# Patient Record
Sex: Male | Born: 1950 | Race: Black or African American | Hispanic: No | Marital: Married | Smoking: Never smoker
Health system: Southern US, Community
[De-identification: ages and names within clinical notes are randomized; demographics above are authoritative.]

## PROBLEM LIST (undated history)

## (undated) DIAGNOSIS — E349 Endocrine disorder, unspecified: Secondary | ICD-10-CM

## (undated) DIAGNOSIS — G4730 Sleep apnea, unspecified: Secondary | ICD-10-CM

## (undated) DIAGNOSIS — H409 Unspecified glaucoma: Secondary | ICD-10-CM

## (undated) DIAGNOSIS — I219 Acute myocardial infarction, unspecified: Secondary | ICD-10-CM

## (undated) DIAGNOSIS — Z86711 Personal history of pulmonary embolism: Secondary | ICD-10-CM

## (undated) DIAGNOSIS — I252 Old myocardial infarction: Secondary | ICD-10-CM

## (undated) HISTORY — DX: Unspecified glaucoma: H40.9

## (undated) HISTORY — DX: Sleep apnea, unspecified: G47.30

## (undated) HISTORY — DX: Endocrine disorder, unspecified: E34.9

## (undated) HISTORY — DX: Acute myocardial infarction, unspecified: I21.9

## (undated) HISTORY — DX: Old myocardial infarction: I25.2

## (undated) HISTORY — DX: Personal history of pulmonary embolism: Z86.711

## (undated) HISTORY — PX: SHOULDER SURGERY: SHX246

---

## 2002-10-23 ENCOUNTER — Emergency Department (HOSPITAL_COMMUNITY): Admission: EM | Admit: 2002-10-23 | Discharge: 2002-10-23 | Payer: Self-pay | Admitting: Emergency Medicine

## 2002-10-23 ENCOUNTER — Encounter: Payer: Self-pay | Admitting: Emergency Medicine

## 2004-10-28 ENCOUNTER — Emergency Department (HOSPITAL_COMMUNITY): Admission: EM | Admit: 2004-10-28 | Discharge: 2004-10-28 | Payer: Self-pay | Admitting: Emergency Medicine

## 2009-07-24 ENCOUNTER — Encounter: Admission: RE | Admit: 2009-07-24 | Discharge: 2009-07-24 | Payer: Self-pay | Admitting: *Deleted

## 2012-11-02 ENCOUNTER — Emergency Department (INDEPENDENT_AMBULATORY_CARE_PROVIDER_SITE_OTHER)
Admission: EM | Admit: 2012-11-02 | Discharge: 2012-11-02 | Disposition: A | Discharge status: Home / Self Care | Payer: BC Managed Care – PPO | Source: Home / Self Care | Attending: Family Medicine | Admitting: Family Medicine

## 2012-11-02 ENCOUNTER — Encounter (HOSPITAL_COMMUNITY): Payer: Self-pay | Admitting: Emergency Medicine

## 2012-11-02 DIAGNOSIS — S0003XA Contusion of scalp, initial encounter: Secondary | ICD-10-CM

## 2012-11-02 DIAGNOSIS — S00432A Contusion of left ear, initial encounter: Secondary | ICD-10-CM

## 2012-11-02 MED ORDER — DOXYCYCLINE HYCLATE 100 MG PO CAPS: 100.0000 mg | ORAL_CAPSULE | Freq: Two times a day (BID) | ORAL | Status: DC

## 2012-11-02 NOTE — ED Notes (Signed)
Pt c/o abscess on left ear/lobe since Saturday. Sx include: pain Denies: fevers, vomiting, nauseas, diarrhea  He is alert w/no signs of acute distress.

## 2012-11-02 NOTE — ED Provider Notes (Signed)
History     CSN: [id number]  Arrival date & time 11/02/12  1356   First MD Initiated Contact with Patient 11/02/12 1506      Chief Complaint  Patient presents with  . Abscess    (Consider location/radiation/quality/duration/timing/severity/associated sxs/prior treatment) Patient is a 62 y.o. male presenting with abscess. The history is provided by the patient.  Abscess  This is a new problem. The current episode started less than one week ago. The onset was sudden. The problem has been gradually worsening. The abscess is present on the left ear. The problem is mild. The abscess is characterized by painfulness and swelling. The abscess first occurred at home. Pertinent negatives include no congestion.    History reviewed. No pertinent past medical history.  Past Surgical History  Procedure Date  . Shoulder surgery     Family History  Problem Relation Age of Onset  . Family history unknown: Yes    History  Substance Use Topics  . Smoking status: Never Smoker   . Smokeless tobacco: Not on file  . Alcohol Use: No      Review of Systems  Constitutional: Negative.   HENT: Positive for ear pain. Negative for hearing loss, congestion and ear discharge.     Allergies  Review of patient's allergies indicates no known allergies.  Home Medications   Current Outpatient Rx  Name  Route  Sig  Dispense  Refill  . DOXYCYCLINE HYCLATE 100 MG PO CAPS   Oral   Take 1 capsule (100 mg total) by mouth 2 (two) times daily.   14 capsule   0   . HYDROCODONE-ACETAMINOPHEN 5-500 MG PO TABS   Oral   Take 1 tablet by mouth every 8 (eight) hours as needed for pain.   15 tablet   0   . IBUPROFEN 600 MG PO TABS   Oral   Take 1 tablet (600 mg total) by mouth every 8 (eight) hours as needed for pain.   21 tablet   0   . PREDNISONE 20 MG PO TABS      2 tabs po daily for 5 days   10 tablet   0   . VALACYCLOVIR HCL 1 G PO TABS   Oral   Take 1 tablet (1,000 mg total) by mouth  3 (three) times daily.   21 tablet   0     BP 124/82  Pulse 72  Temp 97.4 F (36.3 C) (Oral)  Resp 22  SpO2 98%  Physical Exam  Nursing note and vitals reviewed. Constitutional: He is oriented to person, place, and time. He appears well-developed and well-nourished.  HENT:  Head: Normocephalic.    Right Ear: External ear normal.  Nose: Nose normal.  Mouth/Throat: Oropharynx is clear and moist.  Eyes: Pupils are equal, round, and reactive to light.  Neck: Normal range of motion. Neck supple.  Lymphadenopathy:    He has no cervical adenopathy.  Neurological: He is alert and oriented to person, place, and time.  Skin: Skin is warm and dry.    ED Course  INCISION AND DRAINAGE Date/Time: 11/02/2012 4:33 PM Performed by: Linna Hoff Authorized by: Leslee Home C Consent: Verbal consent obtained. Risks and benefits: risks, benefits and alternatives were discussed Consent given by: patient Type: cyst Body area: head/neck Location details: left external ear Anesthesia: local infiltration Local anesthetic: lidocaine 2% without epinephrine Patient sedated: no Scalpel size: 11 Incision type: single straight Complexity: simple Drainage: bloody Drainage amount: moderate Patient tolerance:  Patient tolerated the procedure well with no immediate complications. Comments: dermabond wound closure.   (including critical care time)  Labs Reviewed - No data to display No results found.   1. Hematoma of left ear       MDM          Linna Hoff, MD 11/06/12 [phone]

## 2012-11-02 NOTE — ED Notes (Signed)
VS corrected s/p d/c (at 1708 hours).

## 2012-11-04 ENCOUNTER — Emergency Department (INDEPENDENT_AMBULATORY_CARE_PROVIDER_SITE_OTHER)
Admission: EM | Admit: 2012-11-04 | Discharge: 2012-11-04 | Disposition: A | Discharge status: Home / Self Care | Payer: BC Managed Care – PPO | Source: Home / Self Care | Attending: Family Medicine | Admitting: Family Medicine

## 2012-11-04 ENCOUNTER — Encounter (HOSPITAL_COMMUNITY): Payer: Self-pay | Admitting: *Deleted

## 2012-11-04 DIAGNOSIS — G510 Bell's palsy: Secondary | ICD-10-CM

## 2012-11-04 MED ORDER — PREDNISONE 20 MG PO TABS
ORAL_TABLET | ORAL | Status: DC
Start: 2012-11-04 — End: 2014-03-21

## 2012-11-04 MED ORDER — VALACYCLOVIR HCL 1 G PO TABS: 1000.0000 mg | ORAL_TABLET | Freq: Three times a day (TID) | ORAL | Status: AC

## 2012-11-04 MED ORDER — IBUPROFEN 600 MG PO TABS: 600.0000 mg | ORAL_TABLET | Freq: Three times a day (TID) | ORAL | Status: DC | PRN

## 2012-11-04 MED ORDER — HYDROCODONE-ACETAMINOPHEN 5-500 MG PO TABS: 1.0000 | ORAL_TABLET | Freq: Three times a day (TID) | ORAL | Status: DC | PRN

## 2012-11-04 NOTE — ED Notes (Signed)
Pt reports that he was seen on Monday with ear abscess - states that he started to have left side facial numbness, unable to speak normally or spit - has not improved since monday

## 2012-11-10 NOTE — ED Provider Notes (Signed)
History     CSN: [id number]  Arrival date & time 11/04/12  1348   First MD Initiated Contact with Patient 11/04/12 1354      Chief Complaint  Patient presents with  . Jaw Pain    (Consider location/radiation/quality/duration/timing/severity/associated sxs/prior treatment) HPI Comments: 62 y/o male no significant past medical history other than recent left ear infection. Was here 3 days ago had a swollen tender area in left external ear that was drained. Today patient states swelling and pain in eternal ear is improved, he is taking doxycycline but noticed difficulty closing his left eye and left face droop and altered taste.no visual changes. No balance or gate problems. No extremity weakness, numbness or paresthesias.    History reviewed. No pertinent past medical history.  Past Surgical History  Procedure Date  . Shoulder surgery     Family History  Problem Relation Age of Onset  . Family history unknown: Yes    History  Substance Use Topics  . Smoking status: Never Smoker   . Smokeless tobacco: Not on file  . Alcohol Use: No      Review of Systems  Constitutional: Negative for fever and chills.  HENT: Positive for ear pain. Negative for hearing loss, congestion, sore throat, drooling, trouble swallowing, neck stiffness, sinus pressure and ear discharge.   Eyes: Negative for pain, redness and visual disturbance.  Respiratory: Negative for cough and shortness of breath.   Cardiovascular: Negative for chest pain.  Gastrointestinal: Negative for nausea, vomiting, abdominal pain and diarrhea.  Musculoskeletal: Negative for arthralgias.  Skin: Positive for rash.       External ear swelling, redness.  Neurological: Negative for dizziness, seizures and headaches.  All other systems reviewed and are negative.    Allergies  Review of patient's allergies indicates no known allergies.  Home Medications   Current Outpatient Rx  Name  Route  Sig  Dispense  Refill  .  DOXYCYCLINE HYCLATE 100 MG PO CAPS   Oral   Take 1 capsule (100 mg total) by mouth 2 (two) times daily.   14 capsule   0   . HYDROCODONE-ACETAMINOPHEN 5-500 MG PO TABS   Oral   Take 1 tablet by mouth every 8 (eight) hours as needed for pain.   15 tablet   0   . IBUPROFEN 600 MG PO TABS   Oral   Take 1 tablet (600 mg total) by mouth every 8 (eight) hours as needed for pain.   21 tablet   0   . PREDNISONE 20 MG PO TABS      2 tabs po daily for 5 days   10 tablet   0   . VALACYCLOVIR HCL 1 G PO TABS   Oral   Take 1 tablet (1,000 mg total) by mouth 3 (three) times daily.   21 tablet   0     BP 128/74  Pulse 74  Temp 98.1 F (36.7 C) (Oral)  Resp 20  SpO2 100%  Physical Exam  Nursing note and vitals reviewed. Constitutional: He is oriented to person, place, and time. He appears well-developed and well-nourished. No distress.  HENT:  Head: Normocephalic and atraumatic.  Right Ear: External ear normal.  Nose: Nose normal.  Mouth/Throat: Oropharynx is clear and moist. No oropharyngeal exudate.       Left ear: there is mild erythema and non tender edema over antitragus area. There is a small surgical incision less than 1 cm on top. Minimal scabbing. No purulent  drainage. Impress dry blood inside the ear canal and also staining the left TM.   Neurological: He is alert and oriented to person, place, and time. He has normal reflexes. He displays a negative Romberg sign. Coordination and gait normal. GCS eye subscore is 4. GCS verbal subscore is 5. GCS motor subscore is 6.       Left side subtle facial palsy. With able to close left eye slowly but completely. Mild deviation of mouth towards right side. Asymmetric smile. Palsy involves forehead. No obvious face rash. No hypo or hyperesthesia. Tip of tongue with altered taste.  Normal alternating movements. No arm drop.  Visual fields normal by comparison.  Other than facial palsy no other neurologic focalizations.   Skin: He  is not diaphoretic.    ED Course  Procedures (including critical care time)  Labs Reviewed - No data to display No results found.   1. Bell's palsy       MDM  External ear cellulitis improving (dr. Melrose Nakayama also examined patient and corroborated improved exam on ear infection. Treated Bells palsy with prednisone, valacyclovir, ibuprofen and vicodin. Supportive care and red flags that should prompt his return to medical attention discussed with patient and provided in writing. ENT referral as needed.         Sharin Grave, MD 11/10/12 1610

## 2013-07-20 ENCOUNTER — Observation Stay: Payer: Self-pay | Admitting: Specialist

## 2013-07-20 LAB — CBC
HCT: 44.2 % (ref 40.0–52.0)
HGB: 15.1 g/dL (ref 13.0–18.0)
MCH: 32.6 pg (ref 26.0–34.0)
MCV: 95 fL (ref 80–100)
RDW: 13.3 % (ref 11.5–14.5)
WBC: 9.2 10*3/uL (ref 3.8–10.6)

## 2013-07-20 LAB — COMPREHENSIVE METABOLIC PANEL
Alkaline Phosphatase: 91 U/L (ref 50–136)
Anion Gap: 5 — ABNORMAL LOW (ref 7–16)
Bilirubin,Total: 0.9 mg/dL (ref 0.2–1.0)
Calcium, Total: 9.3 mg/dL (ref 8.5–10.1)
EGFR (African American): >60
Glucose: 100 mg/dL — ABNORMAL HIGH (ref 65–99)
Osmolality: 275 (ref 275–301)
Sodium: 137 mmol/L (ref 136–145)

## 2013-07-20 LAB — APTT: Activated PTT: 37.3 secs — ABNORMAL HIGH (ref 23.6–35.9)

## 2013-07-20 LAB — LIPID PANEL: Ldl Cholesterol, Calc: 80 mg/dL (ref 0–100)

## 2013-07-21 LAB — TROPONIN I: Troponin-I: <0.02 ng/mL

## 2013-07-21 LAB — CK TOTAL AND CKMB (NOT AT ARMC): CK-MB: 1.1 ng/mL (ref 0.5–3.6)

## 2013-09-14 ENCOUNTER — Other Ambulatory Visit: Payer: BC Managed Care – PPO

## 2013-11-11 ENCOUNTER — Other Ambulatory Visit (HOSPITAL_COMMUNITY): Payer: Self-pay | Admitting: *Deleted

## 2013-11-11 DIAGNOSIS — R079 Chest pain, unspecified: Secondary | ICD-10-CM

## 2013-11-16 ENCOUNTER — Other Ambulatory Visit: Payer: Self-pay

## 2013-11-16 ENCOUNTER — Other Ambulatory Visit (INDEPENDENT_AMBULATORY_CARE_PROVIDER_SITE_OTHER): Payer: BC Managed Care – PPO

## 2013-11-16 DIAGNOSIS — R079 Chest pain, unspecified: Secondary | ICD-10-CM

## 2014-01-11 ENCOUNTER — Ambulatory Visit: Payer: Self-pay | Admitting: General Practice

## 2014-02-15 ENCOUNTER — Ambulatory Visit: Payer: Self-pay | Admitting: Internal Medicine

## 2014-03-21 ENCOUNTER — Encounter: Payer: Self-pay | Admitting: Pulmonary Disease

## 2014-03-21 ENCOUNTER — Ambulatory Visit (INDEPENDENT_AMBULATORY_CARE_PROVIDER_SITE_OTHER): Payer: BC Managed Care – PPO | Admitting: Pulmonary Disease

## 2014-03-21 VITALS — BP 128/70 | HR 62 | Ht 73.0 in | Wt 298.0 lb

## 2014-03-21 DIAGNOSIS — R0602 Shortness of breath: Secondary | ICD-10-CM

## 2014-03-21 DIAGNOSIS — I2699 Other pulmonary embolism without acute cor pulmonale: Secondary | ICD-10-CM

## 2014-03-21 NOTE — Patient Instructions (Signed)
We will request records from [facility] for the lung function test Keep taking your Xarelto Keep exercising regularly We will see you back in 2-3 weeks or sooner if needed

## 2014-03-21 NOTE — Assessment & Plan Note (Signed)
Today in the office Mr. Estridge oximetry was normal on ambulation as was his lung exam.  His PFT's only showed mild restriction.  There was a suggestion of obstruction, but the FEV1/FVC ratio was 79% and he has never smoked so COPD is unlikley.  Further, his dyspnea is not intermittent as you would expect in asthma.    I think the most likely explanation for his dyspnea is obesity and deconditioning.  However, to complete this work up I would like to see the images from his CT chest from 06/2014.  We will request this result from [facility].  Plan: -continue diet and exercise -request records from [facility]

## 2014-03-21 NOTE — Assessment & Plan Note (Signed)
He had an unprovoked PE in 06/2013.  This may have been due to his strong family history of blood clots.  Given the fact that the blood clot was unprovoked he may want to undergo testing to look for a familial condition.  However, the results of this work up would not change our management as he would benefit from lifetime anticoagulation as per current ACCP guidelines.    He does not have evidence of CTEPH per the 01/2014 Echo.  Plan: -indefinite Xarelto -consider hypercoagulability work up

## 2014-03-21 NOTE — Progress Notes (Signed)
Subjective:    Patient ID: Albertha GheeGlenn Pollio, male    DOB: Aug 08, 1951, 63 y.o.   MRN: [id number]  HPI  This is a very pleasant 63 year old male with a past medical history significant for a pulmonary embolism who comes to my clinic today for evaluation of shortness of breath. He says that in September of 2014 he developed the fairly abrupt onset of shortness of breath one evening. This persisted with some chest tightness throughout the evening until the next morning. He had to go to work and he had to take his mother to the doctor but while he was doing this the chest tightness and shortness of breath increased. Eventually, while out in the grocery store with his mother, he had to be rushed to the emergency room. He was found to have a large pulmonary embolism. He's does not believe that he receive thrombolytic therapy and he says that he was never put on my support or was in the ICU. He was treated with Xarelto and was discharged home. Ever since then he has had shortness of breath with exertion. He says that he has not been exercising regularly since the hospitalization and he has gained 30 pounds. He has tried exercise in the evenings but he notes that whenever he goes for a walk he has to stop after several minutes of walking. Despite this, he still tries to exercise on a regular basis. He continues to just feel short of breath and that he cannot get enough air in. He does not have wheezing or chest tightness.   He does cough on occasion and produces some yellow mucus.   He has significant leg swelling which persisted before the pulmonary embolism and has not changed much since then.   He continues to take Xarelto every day.   He says that as a child he had no lung problems and he has never been told throughout his adult life that he had a respiratory problem. He never smoked cigarettes.  Past Medical History  Diagnosis Date  . Past heart attack   . Hx of pulmonary embolus      Family History    Problem Relation Age of Onset  . Asthma Maternal Grandfather   . Heart disease Father   . Cancer Paternal Uncle     lung  . Cancer Paternal Uncle      History   Social History  . Marital Status: Single    Spouse Name: N/A    Number of Children: N/A  . Years of Education: N/A   Occupational History  . Not on file.   Social History Main Topics  . Smoking status: Never Smoker   . Smokeless tobacco: Never Used  . Alcohol Use: No  . Drug Use: No  . Sexual Activity: Not on file   Other Topics Concern  . Not on file   Social History Narrative  . No narrative on file     No Known Allergies   Outpatient Prescriptions Prior to Visit  Medication Sig Dispense Refill  . HYDROcodone-acetaminophen (VICODIN) 5-500 MG per tablet Take 1 tablet by mouth every 8 (eight) hours as needed for pain.  15 tablet  0  . doxycycline (VIBRAMYCIN) 100 MG capsule Take 1 capsule (100 mg total) by mouth 2 (two) times daily.  14 capsule  0  . ibuprofen (ADVIL,MOTRIN) 600 MG tablet Take 1 tablet (600 mg total) by mouth every 8 (eight) hours as needed for pain.  21 tablet  0  .  predniSONE (DELTASONE) 20 MG tablet 2 tabs po daily for 5 days  10 tablet  0   No facility-administered medications prior to visit.     Review of Systems  Constitutional: Negative for fever and unexpected weight change.  HENT: Positive for congestion. Negative for dental problem, ear pain, nosebleeds, postnasal drip, rhinorrhea, sinus pressure, sneezing, sore throat and trouble swallowing.   Eyes: Negative for redness and itching.  Respiratory: Positive for cough and shortness of breath. Negative for chest tightness and wheezing.   Cardiovascular: Negative for palpitations and leg swelling.  Gastrointestinal: Negative for nausea and vomiting.  Genitourinary: Negative for dysuria.  Musculoskeletal: Negative for joint swelling.  Skin: Negative for rash.  Neurological: Negative for headaches.  Hematological: Does not  bruise/bleed easily.  Psychiatric/Behavioral: Negative for dysphoric mood. The patient is not nervous/anxious.        Objective:   Physical Exam  Filed Vitals:   03/21/14 1007  BP: 128/70  Pulse: 62  Height: 6\' 1"  (1.854 m)  Weight: 298 lb (135.172 kg)  SpO2: 95%   RA  Gen: well appearing, no acute distress HEENT: NCAT, PERRL, EOMi, OP clear, neck supple without masses PULM: CTA B CV: RRR, no mgr, no JVD AB: BS+, soft, nontender, no hsm Ext: warm, pitting edema in both legs to mid shin, no clubbing, no cyanosis Derm: no rash or skin breakdown Neuro: A&Ox4, CN II-XII intact, strength 5/5 in all 4 extremities      Assessment & Plan:   Shortness of breath Today in the office Mr. Billey oximetry was normal on ambulation as was his lung exam.  His PFT's only showed mild restriction.  There was a suggestion of obstruction, but the FEV1/FVC ratio was 79% and he has never smoked so COPD is unlikley.  Further, his dyspnea is not intermittent as you would expect in asthma.    I think the most likely explanation for his dyspnea is obesity and deconditioning.  However, to complete this work up I would like to see the images from his CT chest from 06/2014.  We will request this result from [facility].  Plan: -continue diet and exercise -request records from [facility]  Pulmonary embolism He had an unprovoked PE in 06/2013.  This may have been due to his strong family history of blood clots.  Given the fact that the blood clot was unprovoked he may want to undergo testing to look for a familial condition.  However, the results of this work up would not change our management as he would benefit from lifetime anticoagulation as per current ACCP guidelines.    He does not have evidence of CTEPH per the 01/2014 Echo.  Plan: -indefinite Xarelto -consider hypercoagulability work up    Updated Medication List Outpatient Encounter Prescriptions as of 03/21/2014  Medication Sig  .  HYDROcodone-acetaminophen (VICODIN) 5-500 MG per tablet Take 1 tablet by mouth every 8 (eight) hours as needed for pain.  . rivaroxaban (XARELTO) 20 MG TABS tablet Take 20 mg by mouth daily with supper.  . [DISCONTINUED] doxycycline (VIBRAMYCIN) 100 MG capsule Take 1 capsule (100 mg total) by mouth 2 (two) times daily.  . [DISCONTINUED] ibuprofen (ADVIL,MOTRIN) 600 MG tablet Take 1 tablet (600 mg total) by mouth every 8 (eight) hours as needed for pain.  . [DISCONTINUED] predniSONE (DELTASONE) 20 MG tablet 2 tabs po daily for 5 days

## 2014-04-18 ENCOUNTER — Encounter: Payer: Self-pay | Admitting: Pulmonary Disease

## 2014-04-18 ENCOUNTER — Ambulatory Visit (INDEPENDENT_AMBULATORY_CARE_PROVIDER_SITE_OTHER): Payer: BC Managed Care – PPO | Admitting: Pulmonary Disease

## 2014-04-18 VITALS — BP 130/78 | HR 69 | Ht 73.0 in | Wt 296.0 lb

## 2014-04-18 DIAGNOSIS — R0602 Shortness of breath: Secondary | ICD-10-CM

## 2014-04-18 DIAGNOSIS — I2699 Other pulmonary embolism without acute cor pulmonale: Secondary | ICD-10-CM

## 2014-04-18 NOTE — Patient Instructions (Signed)
We will refer you to Drs. Gollan and Arida to evaluate your shortness of breath Stay active and try to lose weight Keep taking your Xarelto We will see you back in 6 months or sooner if needed

## 2014-04-18 NOTE — Progress Notes (Signed)
   Subjective:    Patient ID: Jeff Leonard, male    DOB: 12-Feb-1951, 63 y.o.   MRN: [id number]  Synopsis: Initially referred to the [facility] in 2015 for evaluation of shortness of breath. 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  HPI  04/18/2014> Jeff Leonard says that he continues to have some dyspnea on exertion and when he has been singing at church. This is not brought on by any sort of environmental trigger or allergen. It typically occurs when he is active such as climbing a flight of stairs. However, he notes that he is able to walk 45 minutes at a time nearly every day which he does for exercise. Despite doing this regularly for the past several weeks he has not seen an improvement in his shortness of breath nor has he seen a change in his weight.  Past Medical History  Diagnosis Date  . Past heart attack   . Hx of pulmonary embolus      Review of Systems     Objective:   Physical Exam Filed Vitals:   04/18/14 1601  BP: 130/78  Pulse: 69  Height: 6\' 1"  (1.854 m)  Weight: 296 lb (134.265 kg)  SpO2: 96%   RA  Gen: obese but well appearing, no acute distress HEENT: NCAT, PERRL, EOMi, OP clear, neck supple without masses PULM: CTA B CV: RRR, no mgr, no JVD AB: BS+, soft, nontender, no hsm Ext: warm, no edema, no clubbing, no cyanosis     Assessment & Plan:   Shortness of breath Today I reviewed the images from his CT scan of the chest earlier this year when he was diagnosed with a pulmonary embolism. There is no clear evidence of pulmonary parenchymal disease. This, taking into consideration with his normal pulmonary exam, normal ambulatory oximetry, and the only finding of mild restriction on PFTs is encouraging. I do not see clear evidence of lung disease of any kind aside from the isolated unprovoked pulmonary embolism. However, his obesity clearly contributes to his restrictive lung disease and shortness of breath.  Given  his risk factor for coronary artery disease and the fact that I am unable to find evidence of a pulmonary problem, I feel that he should see a cardiologist to make sure there is no evidence of cardiac disease contributing to his dyspnea.  Plan: -Refer to [facility] in [place] -If no evidence of cardiac disease, continue to exercise regularly.  Pulmonary embolism This was an unprovoked pulmonary embolism. As noted in my previous notes we will treat him with Xarelto indefinitely as per ACCP guidelines.    Updated Medication List Outpatient Encounter Prescriptions as of 04/18/2014  Medication Sig  . Brinzolamide-Brimonidine (SIMBRINZA) 1-0.2 % SUSP Apply 1 drop to eye daily.  Marland Kitchen. HYDROcodone-acetaminophen (VICODIN) 5-500 MG per tablet Take 1 tablet by mouth every 8 (eight) hours as needed for pain.  . rivaroxaban (XARELTO) 20 MG TABS tablet Take 20 mg by mouth daily with supper.  . timolol (BETIMOL) 0.25 % ophthalmic solution Apply 1 drop to eye at bedtime.  . travoprost, benzalkonium, (TRAVATAN) 0.004 % ophthalmic solution Place 1 drop into both eyes at bedtime.

## 2014-04-18 NOTE — Assessment & Plan Note (Signed)
This was an unprovoked pulmonary embolism. As noted in my previous notes we will treat him with Xarelto indefinitely as per ACCP guidelines.

## 2014-04-18 NOTE — Assessment & Plan Note (Addendum)
Today I reviewed the images from his CT scan of the chest earlier this year when he was diagnosed with a pulmonary embolism. There is no clear evidence of pulmonary parenchymal disease. This, taking into consideration with his normal pulmonary exam, normal ambulatory oximetry, and the only finding of mild restriction on PFTs is encouraging. I do not see clear evidence of lung disease of any kind aside from the isolated unprovoked pulmonary embolism. However, his obesity clearly contributes to his restrictive lung disease and shortness of breath.  Given his risk factor for coronary artery disease and the fact that I am unable to find evidence of a pulmonary problem, I feel that he should see a cardiologist to make sure there is no evidence of cardiac disease contributing to his dyspnea.  Plan: -Refer to [facility] in [place] -If no evidence of cardiac disease, continue to exercise regularly.

## 2014-05-04 ENCOUNTER — Encounter: Payer: Self-pay | Admitting: Cardiovascular Disease

## 2014-05-04 ENCOUNTER — Ambulatory Visit (INDEPENDENT_AMBULATORY_CARE_PROVIDER_SITE_OTHER): Payer: BC Managed Care – PPO | Admitting: Cardiovascular Disease

## 2014-05-04 VITALS — BP 130/80 | HR 69 | Ht 73.0 in | Wt 296.8 lb

## 2014-05-04 DIAGNOSIS — R079 Chest pain, unspecified: Secondary | ICD-10-CM

## 2014-05-04 DIAGNOSIS — I2699 Other pulmonary embolism without acute cor pulmonale: Secondary | ICD-10-CM

## 2014-05-04 DIAGNOSIS — I82409 Acute embolism and thrombosis of unspecified deep veins of unspecified lower extremity: Secondary | ICD-10-CM

## 2014-05-04 DIAGNOSIS — R0602 Shortness of breath: Secondary | ICD-10-CM

## 2014-05-04 DIAGNOSIS — I82401 Acute embolism and thrombosis of unspecified deep veins of right lower extremity: Secondary | ICD-10-CM

## 2014-05-04 NOTE — Patient Instructions (Addendum)
You are doing well. No medication changes were made.  We will schedule a stress test for shortness of breath  Please call us if you have new issues that need to be addressed before your next appt.   [facility] MYOVIEW  Your caregiver has ordered a Stress Test with nuclear imaging. The purpose of this test is to evaluate the blood supply to your heart muscle. This procedure is referred to as a "Non-Invasive Stress Test." This is because other than having an IV started in your vein, nothing is inserted or "invades" your body. Cardiac stress tests are done to find areas of poor blood flow to the heart by determining the extent of coronary artery disease (CAD). Some patients exercise on a treadmill, which naturally increases the blood flow to your heart, while others who are  unable to walk on a treadmill due to physical limitations have a pharmacologic/chemical stress agent called Lexiscan . This medicine will mimic walking on a treadmill by temporarily increasing your coronary blood flow.   Please note: these test may take anywhere between 2-4 hours to complete  PLEASE REPORT TO [facility]  THE VOLUNTEERS AT THE FIRST DESK WILL DIRECT YOU WHERE TO GO  Date of Procedure:____Monday, July 20_______  Arrival Time for Procedure:____7:45am________  PLEASE NOTIFY THE OFFICE AT LEAST 24 HOURS IN ADVANCE IF YOU ARE UNABLE TO KEEP YOUR APPOINTMENT.  [phone] AND  PLEASE NOTIFY N[facility]AR MEDICINE AT [facility] AT LEAST 24 HOURS IN ADVANCE IF YOU ARE UNABLE TO KEEP YOUR APPOINTMENT. [phone]  How to prepare for your Myoview test:  1. Do not eat or drink after midnight 2. No caffeine for 24 hours prior to test 3. No smoking 24 hours prior to test. 4. Your medication may be taken with water.  If your doctor stopped a medication because of this test, do not take that medication. 5. Ladies, please do not wear dresses.  Skirts or pants are appropriate. Please wear a short sleeve shirt. 6. No  perfume, cologne or lotion. 7. Wear comfortable walking shoes. No heels!

## 2014-05-04 NOTE — Assessment & Plan Note (Addendum)
We discussed his weight with him. He is willing to try dietary changes. He will need to increase his daily exercise

## 2014-05-04 NOTE — Assessment & Plan Note (Addendum)
Hospital records were reviewed including CT scan, echocardiogram, etc. . 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 Suggested he stay on anticoagulation. 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

## 2014-05-04 NOTE — Progress Notes (Signed)
Patient ID: Jeff Leonard, male    DOB: May 12, 1951, 63 y.o.   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 He reports having worsening shortness of breath, chest tightness with exertion.  Reports that his father had a PE and died. He had acute onset of chest pain September 2014, CT scan confirming PE, lower extremity Doppler confirming DVT in the right popliteal vein. He has been on anticoagulation since that time and tolerated this well.  He denies having any significant smoking history, no diabetes.  Is relatively active but does not do any regular exercise. Weight has climbed significantly over the past year, likely a 40 pounds at least. Currently 296 pounds.  He does report having significant shortness of breath with exertion which has been progressive over the past year. Some tightness in his chest with his shortness of breath. Denies having any significant lower extremity edema  Recent echocardiogram x2 showing normal ejection fraction, no significant valve disease. Normal right ventricular systolic pressures.  EKG shows normal sinus rhythm with rate 69 beats per minute, no significant ST or T wave changes   Outpatient Encounter Prescriptions as of 05/04/2014  Medication Sig  . Brinzolamide-Brimonidine (SIMBRINZA) 1-0.2 % SUSP Apply 1 drop to eye daily.  Marland Kitchen. HYDROcodone-acetaminophen (VICODIN) 5-500 MG per tablet Take 1 tablet by mouth every 8 (eight) hours as needed for pain.  . rivaroxaban (XARELTO) 20 MG TABS tablet Take 20 mg by mouth daily with supper.  . timolol (BETIMOL) 0.25 % ophthalmic solution Apply 1 drop to eye at bedtime.  . travoprost, benzalkonium, (TRAVATAN) 0.004 % ophthalmic solution Place 1 drop into both eyes at bedtime.     Review of Systems  Constitutional: Negative.   HENT: Negative.   Eyes: Negative.   Respiratory: Positive for chest  tightness and shortness of breath.   Cardiovascular: Positive for chest pain.  Gastrointestinal: Negative.   Endocrine: Negative.   Musculoskeletal: Negative.   Skin: Negative.   Allergic/Immunologic: Negative.   Neurological: Negative.   Hematological: Negative.   Psychiatric/Behavioral: Negative.   All other systems reviewed and are negative.   BP 130/80  Pulse 69  Ht 6\' 1"  (1.854 m)  Wt 296 lb 12 oz (134.605 kg)  BMI 39.16 kg/m2  Physical Exam  Nursing note and vitals reviewed. Constitutional: He is oriented to person, place, and time. He appears well-developed and well-nourished.  Obese  HENT:  Head: Normocephalic.  Nose: Nose normal.  Mouth/Throat: Oropharynx is clear and moist.  Eyes: Conjunctivae are normal. Pupils are equal, round, and reactive to light.  Neck: Normal range of motion. Neck supple. No JVD present.  Cardiovascular: Normal rate, regular rhythm, S1 normal, S2 normal, normal heart sounds and intact distal pulses.  Exam reveals no gallop and no friction rub.   No murmur heard. Pulmonary/Chest: Effort normal and breath sounds normal. No respiratory distress. He has no wheezes. He has no rales. He exhibits no tenderness.  Abdominal: Soft. Bowel sounds are normal. He exhibits no distension. There is no tenderness.  Musculoskeletal: Normal range of motion. He exhibits no edema and no tenderness.  Lymphadenopathy:    He has no cervical adenopathy.  Neurological: He is alert and oriented to person, place, and time. Coordination normal.  Skin: Skin is warm and dry. No rash noted. No erythema.  Psychiatric: He has a normal mood and affect. His behavior is normal. Judgment  and thought content normal.      Assessment and Plan

## 2014-05-04 NOTE — Assessment & Plan Note (Signed)
Etiology of his shortness of breath is unclear, concerning for angina and ischemia. Father died at an early age. We have ordered a stress test to rule out ischemia. Uncertain if he would be able to treadmill and a Myoview has been ordered. It is unable to treadmill, lexiscan will be performed. This might need to be a today test given his size.

## 2014-05-04 NOTE — Assessment & Plan Note (Signed)
DVT in September 2014. Subsequent PE. Now on anticoagulation

## 2014-05-09 ENCOUNTER — Ambulatory Visit: Payer: Self-pay | Admitting: Cardiovascular Disease

## 2014-05-09 DIAGNOSIS — R0602 Shortness of breath: Secondary | ICD-10-CM

## 2014-05-09 DIAGNOSIS — R079 Chest pain, unspecified: Secondary | ICD-10-CM

## 2014-05-10 ENCOUNTER — Other Ambulatory Visit: Payer: Self-pay

## 2014-05-10 DIAGNOSIS — R0602 Shortness of breath: Secondary | ICD-10-CM

## 2014-05-10 DIAGNOSIS — R079 Chest pain, unspecified: Secondary | ICD-10-CM

## 2014-06-15 ENCOUNTER — Ambulatory Visit: Payer: Self-pay | Admitting: Neurology

## 2014-07-07 ENCOUNTER — Ambulatory Visit: Payer: Self-pay | Admitting: Gastroenterology

## 2015-01-26 ENCOUNTER — Ambulatory Visit
Admit: 2015-01-26 | Disposition: A | Discharge status: Home / Self Care | Payer: Self-pay | Attending: Pain Medicine | Admitting: Pain Medicine

## 2015-02-10 NOTE — Discharge Summary (Signed)
PATIENT NAME:  Jeff Leonard, Jeff Leonard MR#:  [id number] DATE OF BIRTH:  [birth year]  For a detailed note, please take a look at the history and physical done on admission by Dr. Enedina FinnerSona Patel.   DIAGNOSES AT DISCHARGE:  As follows:  1.  Acute pulmonary embolism.  2.  Chest pain secondary pulmonary embolism.  3.  History of glaucoma.   DIET: Regular diet.   ACTIVITY: As tolerated.   FOLLOW-UP: Dr. Dario GuardianJadali in the next 2 to 3 weeks. This is a new appointment for Dr. Dario GuardianJadali. The patient has not seen him before. The patient used to follow up with a primary care physician in [place] but now has moved to [place] and does not have a doctor here.   DISCHARGE MEDICATIONS: As follows: Travatan eye drops 1 drop to each eye daily; timolol 0.5% ophthalmic solution daily; Xarelto 15 mg b.i.d. x20 days, then to start Xarelto 20 mg daily; then after, Tylenol with oxycodone 5/300 one tab q.6 hours as needed for pain.   PERTINENT STUDIES DONE DURING THE HOSPITAL COURSE: Are as follows: An ultrasound of the left lower extremities showing nonocclusive thrombus in the right popliteal vein. No evidence of thrombus in the deep veins of the left lower extremity. A popliteal cyst in the right is present. A chest x-ray done on admission showing hypoinflation but no evidence of pneumonia or congestive heart failure. A CT scan of the chest done with contrast showing  filling defects in the branches of the right lower lobe, pulmonary artery, and possibility of the left lower lobe pulmonary artery branches. Bibasilar atelectasis versus infiltrate versus pulmonary infarction.   HOSPITAL COURSE: This is a 64 year old male with medical problems as mentioned above  who presented to the hospital due to chest pain and noted to have acute pulmonary embolism with pulmonary infarction.   1.  Acute pulmonary embolism with a DVT and also pulmonary infarction. This was likely the cause of the patient's chest pain. Initially it was thought to be  cardiac in nature. The patient was observed on telemetry, had 3 sets of cardiac markers which were negative. He was also seen by cardiology, underwent a 2-dimensional echocardiogram which showed normal LV function with no evidence of wall-motion abnormalities. The likely cause of his chest pain is probably due to his pulmonary embolism with pulmonary infarction. The patient was initially started on Lovenox although was switched over to Xarelto, has tolerated it well. At present, the patient is being discharged on Xarelto and with follow-up with a new primary care physician, Dr. Dario GuardianJadali, as an outpatient. The patient still has some pleurisy but has improved. I did discharge him on a little bit of Norco for his pleuritic chest pain. The patient was ambulated on room air, did not desaturate below 96%; therefore, did not need home oxygen.  2.  Chest pain. I suspect this is secondary to his pulmonary embolism and pulmonary infarction. No evidence of acute coronary syndrome. His cardiac markers x3 have been negative. His echocardiogram showed no LV dysfunction or any wall-motion abnormalities. The patient has been seen by cardiology and they did not think the patient needs any further cardiac intervention at this time.  3.  Glaucoma. The patient was maintained on his timolol and Travatan eye drops and he will resume that upon discharge.   CODE STATUS: FULL CODE.   TIME SPENT ON DISCHARGE: 40 minutes.    ____________________________ Rolly PancakeVivek J. Cherlynn KaiserSainani, MD vjs:np D: 07/22/2013 14:35:18 ET T: 07/22/2013 15:24:40 ET JOB#: [id number]  cc:  Vivek J. Cherlynn Kaiser, MD, <Dictator> Marlyn Corporal, MD Houston Siren MD ELECTRONICALLY SIGNED 07/28/2013 19:15

## 2015-02-10 NOTE — Consult Note (Signed)
PATIENT NAME:  Jeff Leonard, Jeff Leonard MR#:  [id number] DATE OF BIRTH:  1951-02-28  DATE OF CONSULTATION:  07/20/2013  REFERRING PHYSICIAN:   CONSULTING PHYSICIAN:  Marcina MillardAlexander Kacia Halley, MD  PRIMARY CARE PHYSICIAN: Located in [place].  CHIEF COMPLAINT: Chest pain.   REASON FOR CONSULTATION: Requested for evaluation of chest pain.   HISTORY OF PRESENT ILLNESS: The patient is a 64 year old gentleman without significant past medical history who is referred for evaluation of chest pain. The patient apparently was in his usual state of health until early this a.m. when he awoke with left-sided chest discomfort. The patient noted mild diaphoresis. The chest pain lasted for several hours. The patient presented to [facility] Emergency Room where EKG was nondiagnostic. Admission labs were notable for a negative troponin. The patient was admitted to telemetry. D-dimer came back positive at 2.46. The patient describes sharp-like chest pain, particularly when he takes a deep breath or cough.   PAST MEDICAL HISTORY: Glaucoma.  MEDICATIONS: 1. Travatan eyedrops 1 drop each eye daily. 2.  Timolol 0.5% one drop q. a.m.   SOCIAL HISTORY: The patient is married, lives with his wife. Denies tobacco abuse.   FAMILY HISTORY: Positive for coronary artery disease.   REVIEW OF SYSTEMS: CONSTITUTIONAL: No fever or chills.  EYES: No blurry vision.  EARS: No hearing loss.  RESPIRATORY: The patient does have shortness of breath with chest pain.  CARDIOVASCULAR: The patient has sharp chest pain as described above.  GASTROINTESTINAL: The patient had some nausea and vomiting last week. Denies constipation or diarrhea.  GENITOURINARY: No dysuria or hematuria.  ENDOCRINE: No polyuria or polydipsia.  MUSCULOSKELETAL: No arthralgias or myalgias.  NEUROLOGICAL: No focal muscle weakness or numbness.  PSYCHOLOGICAL: No depression or anxiety.   PHYSICAL EXAMINATION: VITAL SIGNS: Blood pressure 126/83, pulse 66, respirations 18,  temperature 98.2 and pulse ox 97%.  HEENT: Pupils equal and reactive to light and accommodation.  NECK: Supple without thyromegaly.  LUNGS: Clear.  HEART: Normal JVP. Normal PMI. Regular rate and rhythm. Normal S1 and S2. No appreciable gallop, murmur or rub.  ABDOMEN: Soft and nontender. Pulses were intact bilaterally.  MUSCULOSKELETAL: Normal muscle tone.  NEUROLOGIC: The patient is alert and oriented x 3. Motor and sensory both grossly intact.   IMPRESSION: A 64 year old gentleman who presents with new onset chest pain which sounds pleuritic in nature. EKG is nondiagnostic. Troponin is negative. The patient has elevated d-dimer.   RECOMMENDATIONS: 1.  Agree with overall current therapy.  2.  Review 2-D echocardiogram.  3.  Consider empiric anticoagulation.  4.  Recommend chest CT to definitively rule out pulmonary embolus.  5.  Consider ETT sestamibi pending chest CT results. ____________________________ Marcina MillardAlexander Sharayah Renfrow, MD ap:sb D: 07/20/2013 16:15:05 ET T: 07/20/2013 16:59:41 ET JOB#: [id number]  cc: Marcina MillardAlexander Wyona Neils, MD, <Dictator> Marcina MillardALEXANDER Michae Grimley MD ELECTRONICALLY SIGNED 08/02/2013 11:26

## 2015-02-10 NOTE — H&P (Signed)
PATIENT NAME:  Jeff Leonard, Braxden MR#:  [id number] DATE OF BIRTH:  April 24, 1951  DATE OF ADMISSION:  07/20/2013  PRIMARY CARE PHYSICIAN:  Out of town in [place].   CHIEF COMPLAINT:  Chest pain since this morning.   HISTORY OF PRESENT ILLNESS: Jeff Leonard is a very pleasant, 64 year old African American gentleman with past medical history of glaucoma, comes to the Emergency Room after he woke up around 3:30 in the morning not feeling well and started having significant amount of chest pain mainly on the left side. The patient's stated it is nonradiating, associated with some nausea and dizziness. He did have mild diaphoresis. Pain got worse from 3:30 to 7:00 and he could not get comfortable more so on deep breath and coughing. Denies any pain on palpation of the chest and denies any heavy lifting or unusual activity in the last few days.  Next, in the Emergency Room, the patient received some nitroglycerin. His pain came down from 10 to 3 and he is hemodynamically stable. I ordered some aspirin for him to take. His EKG shows normal sinus rhythm with left axis deviation. No acute ST elevation or depression. 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 His first set of cardiac enzymes are negative.  PAST MEDICAL HISTORY:  Glaucoma.   PAST SURGICAL HISTORY:  None.   ALLERGIES:  No known drug allergies.   MEDICATIONS: 1.  Travatan 0.004% one drop to each eye once a day.  2. Timolol 0.5% one drop to each eye once a day in the morning.  FAMILY HISTORY:  Positive for premature coronary artery disease.   SOCIAL HISTORY:  Nonsmoker, nondrinker. Lives with his wife at home.  REVIEW OF SYSTEMS:  CONSTITUTIONAL: No fever, fatigue, weakness. Positive for chest pain.  EYES: No blurred or double vision. Positive for glaucoma. No cataracts. No redness.  ENT: No tinnitus, ear pain, hearing  loss or postnasal drip.  RESPIRATORY: No cough, wheeze, hemoptysis, dyspnea, painful respiration.  CARDIOVASCULAR: Positive for chest pain. No dyspnea on exertion, palpitations, syncope.  GASTROINTESTINAL: Positive for mild nausea. No vomiting, diarrhea, abdominal pain GERD.  GENITOURINARY: No dysuria or hematuria or polyuria.  ENDOCRINE: No polyuria, nocturia or thyroid problems.  HEMATOLOGY: No anemia or easy bruising or any bleeding.  SKIN: No acne or rash.  MUSCULOSKELETAL: No arthritis, cramps, swelling or gout.  NEUROLOGIC: No CVA, TIA, vertigo, anxiety or ataxia.  PSYCHIATRIC: No anxiety, bipolar disorder or depression.  All other systems reviewed and negative.   PHYSICAL EXAMINATION: GENERAL: The patient is awake, alert, oriented x 3.  Mild to moderate distress due to chest pain, afebrile.  VITAL SIGNS:  Pulse is 69. Blood pressure is 127/77. Pulse oximetry is 98% on room air on 2 liters.  HEENT: Atraumatic, normocephalic. Pupils: PERRLA.  EOM intact. Oral mucosa is moist.  NECK:  Supple. No JVD. No carotid bruit.  RESPIRATORY: Clear to auscultation bilaterally. No rales, rhonchi, respiratory distress or labored breathing.  CARDIOVASCULAR: Both heart sounds are normal. Rate and rhythm regular. PMI not lateralized. CHEST:  Nontender.  EXTREMITIES:  Good pedal pulses, good femoral pulses. No lower extremity edema.  ABDOMEN: Soft, benign, nontender. No organomegaly. Positive bowel sounds.  NEUROLOGIC: Grossly intact cranial nerves II through XII. No motor or sensory deficits.  PSYCHIATRIC: The patient is awake, alert, oriented x 3.   EKG shows normal sinus rhythm.  LAD.  Chest x-ray shows  no acute cardiopulmonary abnormality.  Glucose is 100. BUN is 16. Creatinine is 1.31. The rest of the LFTs and electrolytes normal. CBC within normal limits. Troponin is less than 0.02.   ASSESSMENT: 64 year old [name] with past medical history of glaucoma, comes in with progressive  increasing chest pain, left-sided, since this morning. He is going to be admitted with: 1.  Chest pain, progressively worsening since morning today. The patient has risk with significant premature coronary artery disease and father who died at age 75 of acute myocardial infarction and uncle who passed away in his 67s of acute myocardial infarction as well. The patient will be admitted on the telemetry floor. Will continue Lovenox, beta blockers, aspirin, nitroglycerin p.r.n., cycle cardiac enzymes x 3, check lipid profile, have cardiology see patient in consultation. We will also give some Prilosec for possible acid reflux.  2.  History of glaucoma. Will continue eye drops as the patient takes at home.  3.  Elevated blood pressure without diagnosis of hypertension. The patient is going to be on beta blockers.  Will continue to monitor blood pressure and adjust medication accordingly.  4.  Deep vein thrombosis prophylaxis with Lovenox subcutaneous b.i.d.  The patient is already on Lovenox.    Above was discussed with the patient. No family members were present. Further workup according to patient's clinical course.   TIME SPENT:  50 minutes  ____________________________ Jearl Klinefelter A. Allena Katz, MD sap:ce D: 07/20/2013 14:58:35 ET T: 07/20/2013 15:14:57 ET JOB#: [id number]  cc: Costella Schwarz A. Allena Katz, MD, <Dictator> Willow Ora MD ELECTRONICALLY SIGNED 07/20/2013 15:45

## 2015-08-09 ENCOUNTER — Telehealth: Payer: Self-pay

## 2015-08-09 NOTE — Telephone Encounter (Signed)
Received records request Crumley Roberts, forwarded to [employer] for processing.  

## 2015-08-24 ENCOUNTER — Ambulatory Visit: Payer: 59 | Attending: Neurology | Admitting: Physical Therapy

## 2015-08-24 DIAGNOSIS — M542 Cervicalgia: Secondary | ICD-10-CM | POA: Insufficient documentation

## 2015-08-24 DIAGNOSIS — R200 Anesthesia of skin: Secondary | ICD-10-CM

## 2015-08-24 DIAGNOSIS — M25512 Pain in left shoulder: Secondary | ICD-10-CM | POA: Insufficient documentation

## 2015-08-24 DIAGNOSIS — R208 Other disturbances of skin sensation: Secondary | ICD-10-CM | POA: Insufficient documentation

## 2015-08-24 DIAGNOSIS — G8929 Other chronic pain: Secondary | ICD-10-CM | POA: Insufficient documentation

## 2015-08-24 NOTE — Therapy (Signed)
[facility] [address] [place], [address] Phone: [phone]   Fax:  [phone]  Physical Therapy Evaluation  Patient Details  Name: Jeff Leonard MRN: [id number] Date of Birth: [birth year] Referring Provider: Malvin Johns  Encounter Date: 08/24/2015      PT End of Session - 08/24/15 1624    Visit Number 1   Number of Visits 9   Date for PT Re-Evaluation 09/21/15   PT Start Time 1430   PT Stop Time 1515   PT Time Calculation (min) 45 min   Activity Tolerance Patient tolerated treatment well;Patient limited by pain   Behavior During Therapy [facility] for tasks assessed/performed      Past Medical History  Diagnosis Date  . Past heart attack   . Hx of pulmonary embolus     Past Surgical History  Procedure Laterality Date  . Shoulder surgery      left shoulder     There were no vitals filed for this visit.  Visit Diagnosis:  Neck pain, chronic - Plan: PT plan of care cert/re-cert  Left shoulder pain - Plan: PT plan of care cert/re-cert  Numbness of left hand - Plan: PT plan of care cert/re-cert      Subjective Assessment - 08/24/15 1517    Subjective Patient reports left arm/shoulder pain and neck pain.    Pertinent History PE, Glaucoma   Limitations Lifting;House hold activities   Patient Stated Goals to have less pain in neck and left shoulder   Currently in Pain? Yes   Pain Score 4    Pain Location Neck   Pain Orientation Posterior   Pain Descriptors / Indicators Sore;Aching   Pain Type Chronic pain   Pain Onset More than a month ago   Pain Frequency Constant   Multiple Pain Sites Yes   Pain Score 4   Pain Location Shoulder   Pain Orientation Left   Pain Descriptors / Indicators Aching;Tingling;Sore   Pain Type Chronic pain   Pain Onset More than a month ago   Pain Frequency Constant            [facility] PT Assessment - 08/24/15 0001    Assessment   Medical Diagnosis chronic back  pain, neck pain, pain of left UE, numbness and tingling   Referring Provider Potter   Onset Date/Surgical Date 08/23/11   Hand Dominance Right   Next MD Visit Jan, 2017   Precautions   Precautions None   Restrictions   Weight Bearing Restrictions No   Balance Screen   Has the patient fallen in the past 6 months No   Has the patient had a decrease in activity level because of a fear of falling?  Yes   Is the patient reluctant to leave their home because of a fear of falling?  No   Home Environment   Living Environment Private residence   Living Arrangements Spouse/significant other   Home Access Stairs to enter   Entrance Stairs-Rails Can reach both   Home Layout One level   Prior Function   Level of Independence Independent with basic ADLs   Vocation Unemployed;Other (comment)  trying to get disability   Observation/Other Assessments   Oswestry Disability Index  58%   Quick DASH  70   Sensation   Light Touch Impaired by gross assessment   Posture/Postural Control   Posture Comments rounded shoulders forward head posture.   AROM   Overall AROM  Deficits;Due to pain  AROM Assessment Site Shoulder   Right/Left Shoulder Left   Left Shoulder Flexion 90 Degrees   Left Shoulder ABduction 90 Degrees   PROM   Overall PROM  Deficits;Due to pain   PROM Assessment Site Shoulder   Right/Left Shoulder Left   Left Shoulder Flexion 110 Degrees   Left Shoulder ABduction 110 Degrees   Strength   Overall Strength Deficits   Right/Left Shoulder Left   Left Shoulder Flexion 3+/5   Left Shoulder Extension 3+/5      Treatment this session:   Exercises to include: chin tuck x 3, corner stretch x 30 sec, levator scap stretch x 30 sec,  Back breaker exercise x 5 reps. Handouts with pictures included given to patient. Demonstration provided for patient.  Discussed pt continuing neck range of motion exercises as well at home.  Distraction of left shoulder performed as well as PA and distal  joint mobs, grade II x 3 bouts. Manual Cervical Distraction grade II x 3 bouts performed with relief from pain, but not numbness/tingling.          PT Education - 08/24/15 1525    Education provided Yes   Education Details POC, exercises, stretches to perform at home   Person(s) Educated Patient   Methods Explanation;Demonstration;Verbal cues;Handout   Comprehension Verbalized understanding;Returned demonstration             PT Long Term Goals - 08/24/15 1630    PT LONG TERM GOAL #1   Title Patient will demonstrate improved functional independence and decreased disability with improvement of Oswestry score to 40%.   Baseline 58%   Time 4   Period Weeks   Status New   PT LONG TERM GOAL #2   Title Patient will be able to manage pain symptoms with home exercises and stretches independently.    Baseline needs instruction   Time 4   Period Weeks   Status New   PT LONG TERM GOAL #3   Title Patient will demonstrate improved functional ability and decreased disability with improved QucikDash score of 55%   Baseline 70%   Time 4   Period Weeks   Status New   PT LONG TERM GOAL #4   Title Patient will report decreased overall pain to less than 3/10 in neck and left arm.    Baseline 4/10   Time 4   Period Weeks   Status New               Plan - 08/24/15 1626    Clinical Impression Statement Patient is a 64 year old male who reports neck pain and left shoulder pain with numbness and tingling for the last 4 years. Pt reports his pain has not worsened over this time, but has not improved. Pt demonstrates weakness of left UE and pain with range of motion of neck and L shoulder . Patient is also observed to have postural dysfunction that may be contributing to his pain.     Pt will benefit from skilled therapeutic intervention in order to improve on the following deficits Pain;Postural dysfunction;Impaired sensation;Decreased strength;Impaired UE functional use;Decreased  activity tolerance;Decreased range of motion   Rehab Potential Fair   Clinical Impairments Affecting Rehab Potential chronic pain   PT Frequency 2x / week   PT Duration 4 weeks   PT Treatment/Interventions Therapeutic exercise;Therapeutic activities;Manual techniques;Patient/family education;Neuromuscular re-education;Passive range of motion   PT Home Exercise Plan patient given chin tucks, back breakers, cervical range of motion, corner stretch to perform  at home with handout.    Consulted and Agree with Plan of Care Patient          G-Codes - 08/24/15 1634    Functional Limitation Carrying, moving and handling objects   Carrying, Moving and Handling Objects Current Status [phone]([id number]) At least 20 percent but less than 40 percent impaired, limited or restricted   Carrying, Moving and Handling Objects Goal Status ([id number]) At least 1 percent but less than 20 percent impaired, limited or restricted       Problem List Patient Active Problem List   Diagnosis Date Noted  . Morbid obesity (HCC) 05/04/2014  . DVT (deep venous thrombosis) (HCC) 05/04/2014  . Shortness of breath 03/21/2014  . Pulmonary embolism 03/21/2014    Zhi Geier, PT, MPT, GCS 08/24/2015, 4:36 PM  [facility] [address] [place], [place], [address] Phone: [phone]   Fax:  [phone]  Name: Albertha GheeGlenn Antolin MRN: [id number] Date of Birth: 1950-11-17

## 2015-08-28 ENCOUNTER — Ambulatory Visit: Payer: 59 | Admitting: Physical Therapy

## 2015-08-28 DIAGNOSIS — M542 Cervicalgia: Principal | ICD-10-CM

## 2015-08-28 DIAGNOSIS — M25512 Pain in left shoulder: Secondary | ICD-10-CM

## 2015-08-28 DIAGNOSIS — G8929 Other chronic pain: Secondary | ICD-10-CM

## 2015-08-28 DIAGNOSIS — R200 Anesthesia of skin: Secondary | ICD-10-CM

## 2015-08-28 NOTE — Therapy (Signed)
[facility] [address] [place], [place], [address] Phone: [phone]   Fax:  [phone]  Physical Therapy Treatment  Patient Details  Name: Jeff Leonard MRN: [id number] Date of Birth: [birth year] Referring Provider: Malvin JohnsPotter  Encounter Date: 08/28/2015      PT End of Session - 08/28/15 1604    Visit Number 2   Number of Visits 9   Date for PT Re-Evaluation 09/21/15   PT Start Time 1515   PT Stop Time 1600   PT Time Calculation (min) 45 min   Activity Tolerance Patient tolerated treatment well;No increased pain;Patient limited by pain   Behavior [facility] Therapy [facility] for tasks assessed/performed      Past Medical History  Diagnosis Date  . Past heart attack   . Hx of pulmonary embolus     Past Surgical History  Procedure Laterality Date  . Shoulder surgery      left shoulder     There were no vitals filed for this visit.  Visit Diagnosis:  Neck pain, chronic  Left shoulder pain  Numbness of left hand      Subjective Assessment - 08/28/15 1518    Subjective Patient reports left arm and neck are feeling better than they have in a few weeks.    Pertinent History PE, Glaucoma   Limitations Lifting;House hold activities   Patient Stated Goals to have less pain in neck and left shoulder   Pain Score 3    Pain Location Neck   Pain Orientation Posterior   Pain Descriptors / Indicators Aching;Sore   Pain Type Chronic pain   Pain Onset More than a month ago   Pain Frequency Constant   Multiple Pain Sites Yes   Pain Score 3   Pain Location Shoulder   Pain Orientation Left   Pain Descriptors / Indicators Aching;Tingling;Sore   Pain Type Chronic pain   Pain Onset More than a month ago   Pain Frequency Constant        Treatment this session:  Exercises to include: chin tuck x 15, rotation to L & R x 15 Discussed pt continuing neck range of motion exercises and HEP given as well at home.   Distraction of left shoulder performed as well as PA and distal joint mobs, grade II x 3 bouts. Manual Cervical Distraction grade II x 5 bouts performed with relief from pain, but not numbness/tingling. Cervical muscle STM as well as left shoulder STM for pain reduction. PROM of L shoulder also for pain control.          PT Education - 08/28/15 1604    Education provided Yes   Education Details reviewed HEP, stretches    Person(s) Educated Patient   Methods Explanation;Demonstration;Verbal cues   Comprehension Verbalized understanding;Returned demonstration             PT Long Term Goals - 08/24/15 1630    PT LONG TERM GOAL #1   Title Patient will demonstrate improved functional independence and decreased disability with improvement of Oswestry score to 40%.   Baseline 58%   Time 4   Period Weeks   Status New   PT LONG TERM GOAL #2   Title Patient will be able to manage pain symptoms with home exercises and stretches independently.    Baseline needs instruction   Time 4   Period Weeks   Status New   PT LONG TERM GOAL #3   Title Patient will demonstrate improved  functional ability and decreased disability with improved QucikDash score of 55%   Baseline 70%   Time 4   Period Weeks   Status New   PT LONG TERM GOAL #4   Title Patient will report decreased overall pain to less than 3/10 in neck and left arm.    Baseline 4/10   Time 4   Period Weeks   Status New               Plan - 08/28/15 1605    Clinical Impression Statement Patient reports improvement in pain symptoms since last session. Patient is performing HEP 3x per day.   Pt will benefit from skilled therapeutic intervention in order to improve on the following deficits Pain;Postural dysfunction;Impaired sensation;Decreased strength;Impaired UE functional use;Decreased activity tolerance;Decreased range of motion   Rehab Potential Fair   PT Frequency 2x / week   PT Duration 4 weeks   PT  Treatment/Interventions Therapeutic exercise;Therapeutic activities;Manual techniques;Patient/family education;Neuromuscular re-education;Passive range of motion   Consulted and Agree with Plan of Care Patient        Problem List Patient Active Problem List   Diagnosis Date Noted  . Morbid obesity (HCC) 05/04/2014  . DVT (deep venous thrombosis) (HCC) 05/04/2014  . Shortness of breath 03/21/2014  . Pulmonary embolism 03/21/2014    Kieara Schwark, PT, MPT, GCS 08/28/2015, 4:07 PM  [facility] [address] [place], [address] Phone: [phone]   Fax:  [phone]  Name: Jeff Leonard MRN: [id number] Date of Birth: [birth year]

## 2015-08-31 ENCOUNTER — Ambulatory Visit: Payer: 59 | Admitting: Physical Therapy

## 2015-08-31 DIAGNOSIS — M25512 Pain in left shoulder: Secondary | ICD-10-CM

## 2015-08-31 DIAGNOSIS — R200 Anesthesia of skin: Secondary | ICD-10-CM

## 2015-08-31 DIAGNOSIS — G8929 Other chronic pain: Secondary | ICD-10-CM

## 2015-08-31 DIAGNOSIS — M542 Cervicalgia: Principal | ICD-10-CM

## 2015-08-31 NOTE — Therapy (Signed)
[facility] [facility] [address] [place], [place], [address] Phone: [phone]   Fax:  [phone]  Physical Therapy Treatment  Patient Details  Name: Jeff GheeGlenn Bickham MRN: [id number] Date of Birth: 1951/09/03 Referring Provider: Malvin JohnsPotter  Encounter Date: 08/31/2015    Past Medical History  Diagnosis Date  . Past heart attack   . Hx of pulmonary embolus     Past Surgical History  Procedure Laterality Date  . Shoulder surgery      left shoulder     There were no vitals filed for this visit.  Visit Diagnosis:  Neck pain, chronic  Left shoulder pain  Numbness of left hand      Subjective Assessment - 08/31/15 1516    Subjective Patient reports 5/10 pain.    Pertinent History PE, Glaucoma   Limitations Lifting;House hold activities   Patient Stated Goals to have less pain in neck and left shoulder   Currently in Pain? Yes   Pain Score 5    Pain Location Neck   Pain Orientation Posterior   Pain Descriptors / Indicators Sore   Pain Type Chronic pain   Pain Onset More than a month ago   Pain Frequency Constant   Multiple Pain Sites No   Pain Score 5   Pain Location Shoulder   Pain Orientation Left   Pain Descriptors / Indicators Sore;Tingling   Pain Type Chronic pain   Pain Onset More than a month ago   Pain Frequency Constant          Treatment this session:  Exercises to include: chin tuck x 15, rotation to L & R x 5, side bend to right x 5, cervical extension x 5 Discussed pt continuing neck range of motion exercises and HEP given as well at home.  Distraction of left shoulder performed as well as PA and distal joint mobs, grade II x 3 bouts. Manual Cervical Distraction grade II x 5 bouts performed with relief from pain, but not numbness/tingling. Cervical muscle STM as well as left shoulder STM for pain reduction. PROM of L shoulder also for pain control.  Cane exercises for shoulder mobility to  include, flexion, chest press, extension, ER and IR. X 5 reps each with increased time required and pain  With each.            PT Education - 08/31/15 1531    Education provided Yes   Education Details reviewed exercises, pain path from neck to shoulder   Person(s) Educated Patient   Methods Explanation;Demonstration   Comprehension Verbalized understanding;Returned demonstration             PT Long Term Goals - 08/24/15 1630    PT LONG TERM GOAL #1   Title Patient will demonstrate improved functional independence and decreased disability with improvement of Oswestry score to 40%.   Baseline 58%   Time 4   Period Weeks   Status New   PT LONG TERM GOAL #2   Title Patient will be able to manage pain symptoms with home exercises and stretches independently.    Baseline needs instruction   Time 4   Period Weeks   Status New   PT LONG TERM GOAL #3   Title Patient will demonstrate improved functional ability and decreased disability with improved QucikDash score of 55%   Baseline 70%   Time 4   Period Weeks   Status New   PT LONG TERM GOAL #4  Title Patient will report decreased overall pain to less than 3/10 in neck and left arm.    Baseline 4/10   Time 4   Period Weeks   Status New               Problem List Patient Active Problem List   Diagnosis Date Noted  . Morbid obesity (HCC) 05/04/2014  . DVT (deep venous thrombosis) (HCC) 05/04/2014  . Shortness of breath 03/21/2014  . Pulmonary embolism 03/21/2014    Jeff Leonard, PT, MPT, GCS 08/31/2015, 3:35 PM  [facility] [address] [place], [address] Phone: [phone]   Fax:  [phone]  Name: Jeff Leonard MRN: [id number] Date of Birth: 20-Aug-1951

## 2015-09-04 ENCOUNTER — Ambulatory Visit: Payer: 59 | Admitting: Physical Therapy

## 2015-09-04 DIAGNOSIS — M25512 Pain in left shoulder: Secondary | ICD-10-CM

## 2015-09-04 DIAGNOSIS — R200 Anesthesia of skin: Secondary | ICD-10-CM

## 2015-09-04 NOTE — Therapy (Signed)
[place] [facility] [address] [place], [address] Phone: [phone]   Fax:  [phone]  Physical Therapy Treatment  Patient Details  Name: Jonathandavid Marlett MRN: [id number] Date of Birth: Jul 29, 1951 Referring Provider: Malvin Johns  Encounter Date: 09/04/2015      PT End of Session - 09/04/15 1623    Visit Number 3   Number of Visits 9   Date for PT Re-Evaluation 09/21/15   PT Start Time 1515   PT Stop Time 1600   PT Time Calculation (min) 45 min   Activity Tolerance Patient tolerated treatment well;No increased pain;Patient limited by pain   Behavior During Therapy [facility] for tasks assessed/performed      Past Medical History  Diagnosis Date  . Past heart attack   . Hx of pulmonary embolus     Past Surgical History  Procedure Laterality Date  . Shoulder surgery      left shoulder     There were no vitals filed for this visit.  Visit Diagnosis:  Left shoulder pain  Numbness of left hand      Subjective Assessment - 09/04/15 1621    Subjective Patient reports 4/10 pain mostly in left shoulder.   Pertinent History PE, Glaucoma   Limitations Lifting;House hold activities   Patient Stated Goals to have less pain in neck and left shoulder   Currently in Pain? Yes   Pain Score 4    Pain Location Shoulder   Pain Orientation Left   Pain Descriptors / Indicators Constant;Tingling;Sore;Tender   Pain Type Chronic pain   Pain Onset More than a month ago   Pain Frequency Constant   Multiple Pain Sites No          Treatment this session:  Exercises to include: chin tuck x 5,  Isometric strengthening cervical rotation to L & R x 5, side bend to right/left  x 5, cervical flexion x 5 Discussed pt continuing neck range of motion, cervical isometric exercise and HEP given as well at home.  Left shoulder PA and distal joint mobs, grade II x 3 bouts. PROM of L shoulder for pain control and to maintain  mobility.  Cane exercises for shoulder mobility to include, flexion, chest press, extension, ER and IR. X 5 reps each with increased time required and pain  With each. Active elbow flexion, supination/pronation of forearm, finger open/close. Shoulder flexion with elbow bent  X 10 reps each.          PT Education - 09/04/15 1622    Education provided Yes   Education Details neck isometric strengthening exercises. Improtance of using left arm, possibly getting imaging of left shoulder to rule out any problem with the shoulder itself.    Person(s) Educated Patient   Methods Explanation;Demonstration;Verbal cues   Comprehension Verbalized understanding;Returned demonstration             PT Long Term Goals - 08/24/15 1630    PT LONG TERM GOAL #1   Title Patient will demonstrate improved functional independence and decreased disability with improvement of Oswestry score to 40%.   Baseline 58%   Time 4   Period Weeks   Status New   PT LONG TERM GOAL #2   Title Patient will be able to manage pain symptoms with home exercises and stretches independently.    Baseline needs instruction   Time 4   Period Weeks   Status New   PT LONG TERM GOAL #3  Title Patient will demonstrate improved functional ability and decreased disability with improved QucikDash score of 55%   Baseline 70%   Time 4   Period Weeks   Status New   PT LONG TERM GOAL #4   Title Patient will report decreased overall pain to less than 3/10 in neck and left arm.    Baseline 4/10   Time 4   Period Weeks   Status New               Plan - 09/04/15 1624    Clinical Impression Statement Patient reports that he only uses left arm when he has to. Discussed the importance of using the left arm as he can. Patient has full passive rom of left arm, however this is painful. When he performs AAROM he demonstrates decreased rom. of left shoulder.    Pt will benefit from skilled therapeutic intervention in order to  improve on the following deficits Pain;Postural dysfunction;Impaired sensation;Decreased strength;Impaired UE functional use;Decreased activity tolerance;Decreased range of motion   Rehab Potential Fair   PT Frequency 2x / week   PT Duration 4 weeks   PT Treatment/Interventions Therapeutic exercise;Therapeutic activities;Manual techniques;Patient/family education;Neuromuscular re-education;Passive range of motion   PT Home Exercise Plan patient given chin tucks, back breakers, cervical range of motion, corner stretch to perform at home with handout. Added isometric neck stretngthening exercises and cane AAROM exercises for left shoulder.    Consulted and Agree with Plan of Care Patient        Problem List Patient Active Problem List   Diagnosis Date Noted  . Morbid obesity (HCC) 05/04/2014  . DVT (deep venous thrombosis) (HCC) 05/04/2014  . Shortness of breath 03/21/2014  . Pulmonary embolism 03/21/2014    Adden Strout, PT, MPT, GCS 09/04/2015, 4:28 PM  [facility] [address] [place] , [place], [address] Phone: [phone]   Fax:  [phone]  Name: Albertha GheeGlenn Hengst MRN: [id number] Date of Birth: 03/04/1951

## 2015-09-07 ENCOUNTER — Ambulatory Visit: Payer: 59 | Admitting: Physical Therapy

## 2015-09-11 ENCOUNTER — Ambulatory Visit: Payer: 59 | Admitting: Physical Therapy

## 2015-09-18 ENCOUNTER — Ambulatory Visit: Payer: 59 | Admitting: Physical Therapy

## 2015-09-21 ENCOUNTER — Ambulatory Visit: Payer: 59 | Attending: Neurology | Admitting: Physical Therapy

## 2015-09-21 DIAGNOSIS — M542 Cervicalgia: Secondary | ICD-10-CM | POA: Insufficient documentation

## 2015-09-21 DIAGNOSIS — M25512 Pain in left shoulder: Secondary | ICD-10-CM | POA: Diagnosis present

## 2015-09-21 DIAGNOSIS — R29898 Other symptoms and signs involving the musculoskeletal system: Secondary | ICD-10-CM | POA: Insufficient documentation

## 2015-09-21 DIAGNOSIS — R200 Anesthesia of skin: Secondary | ICD-10-CM

## 2015-09-21 DIAGNOSIS — G8929 Other chronic pain: Secondary | ICD-10-CM | POA: Diagnosis present

## 2015-09-21 DIAGNOSIS — R208 Other disturbances of skin sensation: Secondary | ICD-10-CM | POA: Insufficient documentation

## 2015-09-21 NOTE — Therapy (Signed)
[facility] [address] [place], [place], [address] Phone: [phone]   Fax:  [phone]  Physical Therapy Treatment  Patient Details  Name: Jeff GheeGlenn Maddux MRN: [id number] Date of Birth: 01-13-1951 Referring Meisha Salone: Malvin JohnsPotter  Encounter Date: 09/21/2015      PT End of Session - 09/21/15 1724    Visit Number 4   Number of Visits 9   Date for PT Re-Evaluation 10/19/15   PT Start Time 1630   PT Stop Time 1715   PT Time Calculation (min) 45 min   Activity Tolerance Patient tolerated treatment well;Patient limited by pain   Behavior During Therapy [facility] for tasks assessed/performed      Past Medical History  Diagnosis Date  . Past heart attack   . Hx of pulmonary embolus     Past Surgical History  Procedure Laterality Date  . Shoulder surgery      left shoulder     There were no vitals filed for this visit.  Visit Diagnosis:  Left shoulder pain - Plan: PT plan of care cert/re-cert  Numbness of left hand - Plan: PT plan of care cert/re-cert  Neck pain, chronic - Plan: PT plan of care cert/re-cert  Weakness of left arm - Plan: PT plan of care cert/re-cert      Subjective Assessment - 09/21/15 1722    Subjective Patient reports he is having one of the bast days he's had in a while. Reports 3/10 pain.    Pertinent History PE, Glaucoma   Limitations Lifting;House hold activities   Patient Stated Goals to have less pain in neck and left shoulder   Currently in Pain? Yes   Pain Score 3    Pain Location Shoulder   Pain Orientation Left   Pain Descriptors / Indicators Constant;Tingling;Aching;Sore   Pain Type Chronic pain   Pain Onset More than a month ago   Pain Frequency Constant   Multiple Pain Sites Yes   Pain Score 3   Pain Location Neck   Pain Orientation Posterior   Pain Descriptors / Indicators Aching   Pain Type Chronic pain   Pain Onset More than a month ago   Pain Frequency Constant          Treatment this session:  Exercises to include: chin tuck x 5,cervical flexion in supine x 5. Reviewed HEP including: Isometric strengthening cervical rotation to L & R x 5, side bend to right/left x 5, cervical flexion x 5  Left shoulder PA and distal joint mobs, grade II x 3 bouts, shoulder distraction x 2. PROM of L shoulder for pain control and to maintain mobility in flexion and abduction within pain free range.  Cane exercises for shoulder mobility to include, flexion, chest press, extension, ER and IR. X 5-10 reps each with increased time required and pain  With each. Active elbow flexion, supination/pronation of forearm, hand open/close, finger opposition. .         PT Education - 09/21/15 1724    Education provided Yes   Education Details reveiwed exercises, re-assessment results and POC to continue PT.    Person(s) Educated Patient   Methods Explanation   Comprehension Verbalized understanding             PT Long Term Goals - 09/21/15 1727    PT LONG TERM GOAL #1   Title Patient will demonstrate improved functional independence and decreased disability with improvement of Oswestry score to 40%.   Baseline  58%, 54%   Time 4   Period Weeks   Status On-going   PT LONG TERM GOAL #2   Title Patient will be able to manage pain symptoms with home exercises and stretches independently.    Baseline needs instruction   Time 4   Period Weeks   Status On-going   PT LONG TERM GOAL #3   Title Patient will demonstrate improved functional ability and decreased disability with improved QucikDash score of 55%   Baseline 70%, 90%at re-assessment   Time 4   Period Weeks   Status On-going   PT LONG TERM GOAL #4   Title Patient will report decreased overall pain to less than 3/10 in neck and left arm.    Baseline 4/10, 3/10   Time 4   Period Weeks   Status On-going               Plan - 10-18-15 1725    Clinical Impression Statement Patient has made  some improvement with reported pain, improvement with left shoulder mobility. Patient and I discussed continuing PT for another 4 weeks.   Pt will benefit from skilled therapeutic intervention in order to improve on the following deficits Pain;Postural dysfunction;Impaired sensation;Decreased strength;Impaired UE functional use;Decreased activity tolerance;Decreased range of motion   Rehab Potential Fair   PT Frequency 2x / week   PT Duration 4 weeks   PT Treatment/Interventions Therapeutic exercise;Therapeutic activities;Manual techniques;Patient/family education;Neuromuscular re-education;Passive range of motion   PT Home Exercise Plan patient given chin tucks, back breakers, cervical range of motion, corner stretch to perform at home with handout. Added isometric neck stretngthening exercises and cane AAROM exercises for left shoulder.    Consulted and Agree with Plan of Care Patient          G-Codes - 10/18/15 1729    Functional Assessment Tool Used Neldon Mc, Oswestry, Clinical Judgement   Functional Limitation Carrying, moving and handling objects   Carrying, Moving and Handling Objects Current Status [phone]) At least 20 percent but less than 40 percent impaired, limited or restricted   Carrying, Moving and Handling Objects Goal Status (U0454) At least 1 percent but less than 20 percent impaired, limited or restricted      Problem List Patient Active Problem List   Diagnosis Date Noted  . Morbid obesity (HCC) 05/04/2014  . DVT (deep venous thrombosis) (HCC) 05/04/2014  . Shortness of breath 03/21/2014  . Pulmonary embolism 03/21/2014    Leonard,Jeff, PT, MPT, GCS 2015-10-18, 5:33 PM  [facility] [address], [place], [address] Phone: [phone]   Fax:  [phone]  Name: Jeff Leonard MRN: [id number] Date of Birth: 1951/08/18

## 2015-09-25 ENCOUNTER — Ambulatory Visit: Payer: 59 | Admitting: Physical Therapy

## 2015-09-25 DIAGNOSIS — M25512 Pain in left shoulder: Secondary | ICD-10-CM

## 2015-09-25 DIAGNOSIS — G8929 Other chronic pain: Secondary | ICD-10-CM

## 2015-09-25 DIAGNOSIS — R200 Anesthesia of skin: Secondary | ICD-10-CM

## 2015-09-25 DIAGNOSIS — R29898 Other symptoms and signs involving the musculoskeletal system: Secondary | ICD-10-CM

## 2015-09-25 DIAGNOSIS — M542 Cervicalgia: Secondary | ICD-10-CM

## 2015-09-25 NOTE — Therapy (Signed)
[facility] [address] [place], [address] Phone: [phone]   Fax:  [phone]  Physical Therapy Treatment  Patient Details  Name: Jeff Leonard MRN: [id number] Date of Birth: Feb 04, 1951 Referring Provider: Malvin Johns  Encounter Date: 09/25/2015      PT End of Session - 09/25/15 1344    Visit Number 2   Number of Visits 9   Date for PT Re-Evaluation 10/19/15   PT Start Time 1300   PT Stop Time 1345   PT Time Calculation (min) 45 min   Activity Tolerance Patient tolerated treatment well;No increased pain   Behavior During Therapy [facility] for tasks assessed/performed      Past Medical History  Diagnosis Date  . Past heart attack   . Hx of pulmonary embolus     Past Surgical History  Procedure Laterality Date  . Shoulder surgery      left shoulder     There were no vitals filed for this visit.  Visit Diagnosis:  Left shoulder pain  Numbness of left hand  Weakness of left arm  Neck pain, chronic      Subjective Assessment - 09/25/15 1306    Subjective Patient reports he is not feeling as good as he was last week. 6/10 pain this session.    Pertinent History PE, Glaucoma   Limitations Lifting;House hold activities   Patient Stated Goals to have less pain in neck and left shoulder   Currently in Pain? Yes   Pain Score 6    Pain Location Shoulder   Pain Orientation Left;Anterior   Pain Descriptors / Indicators Aching   Pain Type Chronic pain   Pain Onset More than a month ago   Pain Frequency Constant         Treatment this session:  Exercises to include: Isometric strengthening cervical rotation to L & R x 5, side bend to right/left x 5, cervical flexion x 5  Left shoulder PA and distal joint mobs, grade II x 3 bouts, shoulder distraction x 2. PROM of L shoulder for pain control and to maintain mobility in flexion and abduction within pain free range.  Cane exercises for  shoulder mobility to include, flexion, chest press, extension, ER and IR. X 10 reps each with increased time required and pain  With each. Active shoulder flexion, elbow flexion, supination/pronation of forearm, hand open/close, finger opposition x 5 reps each           PT Education - 09/25/15 1308    Education provided Yes   Education Details POC, exercises, proper from with exercises.    Person(s) Educated Patient   Methods Explanation   Comprehension Verbalized understanding             PT Long Term Goals - 09/21/15 1727    PT LONG TERM GOAL #1   Title Patient will demonstrate improved functional independence and decreased disability with improvement of Oswestry score to 40%.   Baseline 58%, 54%   Time 4   Period Weeks   Status On-going   PT LONG TERM GOAL #2   Title Patient will be able to manage pain symptoms with home exercises and stretches independently.    Baseline needs instruction   Time 4   Period Weeks   Status On-going   PT LONG TERM GOAL #3   Title Patient will demonstrate improved functional ability and decreased disability with improved QucikDash score of 55%   Baseline 70%, 90%at  re-assessment   Time 4   Period Weeks   Status On-going   PT LONG TERM GOAL #4   Title Patient will report decreased overall pain to less than 3/10 in neck and left arm.    Baseline 4/10, 3/10   Time 4   Period Weeks   Status On-going               Plan - 09/25/15 1345    Clinical Impression Statement Patient reporting increased pain again this session. Was not performing HEP over weekend.    Pt will benefit from skilled therapeutic intervention in order to improve on the following deficits Pain;Postural dysfunction;Impaired sensation;Decreased strength;Impaired UE functional use;Decreased activity tolerance;Decreased range of motion   Rehab Potential Fair   Clinical Impairments Affecting Rehab Potential chronic pain   PT Frequency 2x / week   PT Duration 4  weeks   PT Treatment/Interventions Therapeutic exercise;Therapeutic activities;Manual techniques;Patient/family education;Neuromuscular re-education;Passive range of motion   PT Home Exercise Plan patient given chin tucks, back breakers, cervical range of motion, corner stretch to perform at home with handout. Added isometric neck stretngthening exercises and cane AAROM exercises for left shoulder.    Consulted and Agree with Plan of Care Patient        Problem List Patient Active Problem List   Diagnosis Date Noted  . Morbid obesity (HCC) 05/04/2014  . DVT (deep venous thrombosis) (HCC) 05/04/2014  . Shortness of breath 03/21/2014  . Pulmonary embolism 03/21/2014    Henrry Feil, PT, MPT, GCS 09/25/2015, 1:47 PM  [facility] [address] [place], [place], [address] Phone: [phone]   Fax:  [phone]  Name: Jeff Leonard MRN: [id number] Date of Birth: January 02, 1951

## 2015-09-28 ENCOUNTER — Ambulatory Visit: Payer: 59 | Admitting: Physical Therapy

## 2015-09-28 DIAGNOSIS — R200 Anesthesia of skin: Secondary | ICD-10-CM

## 2015-09-28 DIAGNOSIS — R29898 Other symptoms and signs involving the musculoskeletal system: Secondary | ICD-10-CM

## 2015-09-28 DIAGNOSIS — M25512 Pain in left shoulder: Secondary | ICD-10-CM | POA: Diagnosis not present

## 2015-09-28 NOTE — Therapy (Signed)
[facility] [address] [place], [place], [address] Phone: [phone]   Fax:  [phone]  Physical Therapy Treatment  Patient Details  Name: Jeff GheeGlenn Shepardson MRN: [id number] Date of Birth: 06/25/1951 Referring Provider: Malvin JohnsPotter  Encounter Date: 09/28/2015      PT End of Session - 09/28/15 1348    Visit Number 3   Number of Visits 9   Date for PT Re-Evaluation 10/19/15   PT Start Time 1300   PT Stop Time 1345   PT Time Calculation (min) 45 min   Activity Tolerance Patient tolerated treatment well;No increased pain   Behavior During Therapy [facility] for tasks assessed/performed      Past Medical History  Diagnosis Date  . Past heart attack   . Hx of pulmonary embolus     Past Surgical History  Procedure Laterality Date  . Shoulder surgery      left shoulder     There were no vitals filed for this visit.  Visit Diagnosis:  Left shoulder pain  Weakness of left arm  Numbness of left hand      Subjective Assessment - 09/28/15 1345    Subjective Patient reports he is not feeling too bad today. Reports 3/10 pain.   Pertinent History PE, Glaucoma   Limitations Lifting;House hold activities   Patient Stated Goals to have less pain in neck and left shoulder   Currently in Pain? Yes   Pain Score 3    Pain Location Shoulder   Pain Orientation Left   Pain Descriptors / Indicators Aching   Pain Type Chronic pain   Pain Onset More than a month ago   Pain Frequency Constant   Multiple Pain Sites No         Treatment this session:    Left shoulder PA and distal joint mobs, grade II x 3 bouts, shoulder distraction x 2. PROM of L shoulder for pain control and to maintain mobility in flexion and abduction within pain free range.  Cane exercises for shoulder mobility to include, flexion, chest press, extension, ER and IR. X 1 min each each with increased time required and pain  With each. UE ranger in  arc and forward/back motions x 2 min each.           PT Education - 09/28/15 1348    Education provided Yes   Education Details exercises and proper form   Person(s) Educated Patient   Methods Explanation;Demonstration   Comprehension Verbalized understanding;Returned demonstration             PT Long Term Goals - 09/21/15 1727    PT LONG TERM GOAL #1   Title Patient will demonstrate improved functional independence and decreased disability with improvement of Oswestry score to 40%.   Baseline 58%, 54%   Time 4   Period Weeks   Status On-going   PT LONG TERM GOAL #2   Title Patient will be able to manage pain symptoms with home exercises and stretches independently.    Baseline needs instruction   Time 4   Period Weeks   Status On-going   PT LONG TERM GOAL #3   Title Patient will demonstrate improved functional ability and decreased disability with improved QucikDash score of 55%   Baseline 70%, 90%at re-assessment   Time 4   Period Weeks   Status On-going   PT LONG TERM GOAL #4   Title Patient will report decreased overall pain to less than  3/10 in neck and left arm.    Baseline 4/10, 3/10   Time 4   Period Weeks   Status On-going               Plan - 09/28/15 1349    Clinical Impression Statement Patient having decreased pain this session. Pt continues to not use left UE as much as he should. Pt continues to have left shoulder pain and numbness of left hand.    Pt will benefit from skilled therapeutic intervention in order to improve on the following deficits Pain;Postural dysfunction;Impaired sensation;Decreased strength;Impaired UE functional use;Decreased activity tolerance;Decreased range of motion   Rehab Potential Fair   PT Frequency 2x / week   PT Duration 4 weeks   PT Treatment/Interventions Therapeutic exercise;Therapeutic activities;Manual techniques;Patient/family education;Neuromuscular re-education;Passive range of motion   PT Home  Exercise Plan patient given chin tucks, back breakers, cervical range of motion, corner stretch to perform at home with handout. Added isometric neck stretngthening exercises and cane AAROM exercises for left shoulder.    Consulted and Agree with Plan of Care Patient        Problem List Patient Active Problem List   Diagnosis Date Noted  . Morbid obesity (HCC) 05/04/2014  . DVT (deep venous thrombosis) (HCC) 05/04/2014  . Shortness of breath 03/21/2014  . Pulmonary embolism 03/21/2014    Issac Moure, PT, MPT, GCS 09/28/2015, 1:51 PM  [place] [address] [place], [address] Phone: [phone]   Fax:  [phone]  Name: Lavelle Akel MRN: [id number] Date of Birth: 1951/07/26

## 2015-10-02 ENCOUNTER — Ambulatory Visit: Payer: 59 | Admitting: Physical Therapy

## 2015-10-05 ENCOUNTER — Ambulatory Visit: Payer: 59 | Admitting: Physical Therapy

## 2015-10-05 DIAGNOSIS — M25512 Pain in left shoulder: Secondary | ICD-10-CM | POA: Diagnosis not present

## 2015-10-05 DIAGNOSIS — G8929 Other chronic pain: Secondary | ICD-10-CM

## 2015-10-05 DIAGNOSIS — M542 Cervicalgia: Secondary | ICD-10-CM

## 2015-10-05 DIAGNOSIS — R29898 Other symptoms and signs involving the musculoskeletal system: Secondary | ICD-10-CM

## 2015-10-05 DIAGNOSIS — R200 Anesthesia of skin: Secondary | ICD-10-CM

## 2015-10-05 NOTE — Therapy (Signed)
[facility] [address] [place], [address] Phone: [phone]   Fax:  [phone]  Physical Therapy Treatment  Patient Details  Name: Jeff Leonard MRN: [id number] Date of Birth: [birth year] Referring Provider: Malvin Leonard  Encounter Date: 10/05/2015      PT End of Session - 10/05/15 1720    Visit Number 4   Number of Visits 9   Date for PT Re-Evaluation 10/19/15   PT Start Time 1305   PT Stop Time 1345   PT Time Calculation (min) 40 min   Activity Tolerance Patient tolerated treatment well;No increased pain;Patient limited by pain   Behavior During Therapy Jeff Leonard for tasks assessed/performed      Past Medical History  Diagnosis Date  . Past heart attack   . Hx of pulmonary embolus     Past Surgical History  Procedure Laterality Date  . Shoulder surgery      left shoulder     There were no vitals filed for this visit.  Visit Diagnosis:  Weakness of left arm  Left shoulder pain  Numbness of left hand  Neck pain, chronic      Subjective Assessment - 10/05/15 1718    Subjective Patient reports he has had a stomach bug or food poisoning for the last 3 days. Feels weak. Neck is bothering him more than shoulder at this time.    Pertinent History PE, Glaucoma   Limitations Lifting;House hold activities   Patient Stated Goals to have less pain in neck and left shoulder   Currently in Pain? Yes   Pain Location Neck   Pain Orientation Left   Pain Descriptors / Indicators Aching;Sore   Pain Type Chronic pain   Pain Onset More than a month ago   Pain Frequency Constant   Multiple Pain Sites No         Left shoulder PA and distal joint mobs, grade II x 3 bouts, shoulder distraction x 2. PROM of L shoulder for pain control and to maintain mobility in flexion and abduction within pain free range.  Cane exercises for shoulder mobility to include, flexion, chest press, extension, ER and IR. X 1 min  each each with increased time required and pain  With each. UE ranger in arc and forward/back motions x 2 min each.  Cervical active range of motion to include rotation, side bending and flexion x 5 reps each with 5 sec hold.            PT Education - 10/05/15 1720    Education provided Yes   Education Details AAROM exercises   Person(s) Educated Patient   Methods Explanation;Demonstration   Comprehension Verbalized understanding;Returned demonstration             PT Long Term Goals - 09/21/15 1727    PT LONG TERM GOAL #1   Title Patient will demonstrate improved functional independence and decreased disability with improvement of Oswestry score to 40%.   Baseline 58%, 54%   Time 4   Period Weeks   Status On-going   PT LONG TERM GOAL #2   Title Patient will be able to manage pain symptoms with home exercises and stretches independently.    Baseline needs instruction   Time 4   Period Weeks   Status On-going   PT LONG TERM GOAL #3   Title Patient will demonstrate improved functional ability and decreased disability with improved QucikDash score of 55%   Baseline 70%, 90%at  re-assessment   Time 4   Period Weeks   Status On-going   PT LONG TERM GOAL #4   Title Patient will report decreased overall pain to less than 3/10 in neck and left arm.    Baseline 4/10, 3/10   Time 4   Period Weeks   Status On-going               Plan - 10/05/15 1721    Clinical Impression Statement Patient continues to  have left shoulder and neck pain. Doesnt seem to be changing much with current therapy.    Pt will benefit from skilled therapeutic intervention in order to improve on the following deficits Pain;Postural dysfunction;Impaired sensation;Decreased strength;Impaired UE functional use;Decreased activity tolerance;Decreased range of motion   Rehab Potential Fair   PT Frequency 2x / week   PT Duration 4 weeks   PT Treatment/Interventions Therapeutic  exercise;Therapeutic activities;Manual techniques;Patient/family education;Neuromuscular re-education;Passive range of motion   PT Home Exercise Plan patient given chin tucks, back breakers, cervical range of motion, corner stretch to perform at home with handout. Added isometric neck stretngthening exercises and cane AAROM exercises for left shoulder.    Consulted and Agree with Plan of Care Patient        Problem List Patient Active Problem List   Diagnosis Date Noted  . Morbid obesity (HCC) 05/04/2014  . DVT (deep venous thrombosis) (HCC) 05/04/2014  . Shortness of breath 03/21/2014  . Pulmonary embolism 03/21/2014    Jeff Leonard, PT, MPT, GCS 10/05/2015, 5:24 PM  [place] [facility] [address] [place], [place], [address] Phone: [phone]   Fax:  [phone]  Name: Jeff Leonard MRN: [id number] Date of Birth: 05-09-1951

## 2015-10-10 ENCOUNTER — Ambulatory Visit: Payer: 59 | Admitting: Physical Therapy

## 2015-10-10 DIAGNOSIS — R29898 Other symptoms and signs involving the musculoskeletal system: Secondary | ICD-10-CM

## 2015-10-10 DIAGNOSIS — M25512 Pain in left shoulder: Secondary | ICD-10-CM | POA: Diagnosis not present

## 2015-10-10 DIAGNOSIS — M542 Cervicalgia: Secondary | ICD-10-CM

## 2015-10-10 DIAGNOSIS — R200 Anesthesia of skin: Secondary | ICD-10-CM

## 2015-10-10 DIAGNOSIS — G8929 Other chronic pain: Secondary | ICD-10-CM

## 2015-10-10 NOTE — Therapy (Signed)
[facility] [facility] [address] [place], [place], [address] Phone: [phone]   Fax:  [phone]  Physical Therapy Treatment  Patient Details  Name: Jeff Leonard MRN: [id number] Date of Birth: Jan 31, 1951 Referring Provider: Malvin JohnsPotter  Encounter Date: 10/10/2015      PT End of Session - 10/10/15 1544    Visit Number 5   Number of Visits 9   Date for PT Re-Evaluation 10/19/15   PT Start Time 1500   PT Stop Time 1545   PT Time Calculation (min) 45 min   Activity Tolerance Patient tolerated treatment well;No increased pain;Patient limited by pain   Behavior During Therapy [facility] for tasks assessed/performed      Past Medical History  Diagnosis Date  . Past heart attack   . Hx of pulmonary embolus     Past Surgical History  Procedure Laterality Date  . Shoulder surgery      left shoulder     There were no vitals filed for this visit.  Visit Diagnosis:  Weakness of left arm  Left shoulder pain  Numbness of left hand  Neck pain, chronic      Subjective Assessment - 10/10/15 1505    Subjective Patient reports mostly neck pain but not too bad today. 3/10.   Pertinent History PE, Glaucoma   Limitations Lifting;House hold activities   Patient Stated Goals to have less pain in neck and left shoulder   Currently in Pain? Yes   Pain Score 3    Pain Location Neck   Pain Orientation Left   Pain Descriptors / Indicators Aching;Sore   Pain Type Chronic pain   Pain Onset More than a month ago   Pain Frequency Constant   Multiple Pain Sites No           Left shoulder PA and distal joint mobs, grade II x 3 bouts, shoulder distraction x 2. PROM of L shoulder for pain control and to maintain mobility in flexion and abduction within pain free range.  Cane exercises for shoulder mobility to include, flexion, chest press, extension, ER and IR. X 1 min each each with increased time required and pain  With  each. UE ranger in arc and forward/back motions x 2 min each.  Cervical active range of motion to include rotation, side bending  x 5 reps each with 5 sec hold.                PT Education - 10/10/15 1544    Education provided Yes   Education Details exercises, proper form   Person(s) Educated Patient   Methods Explanation   Comprehension Verbalized understanding;Returned demonstration             PT Long Term Goals - 09/21/15 1727    PT LONG TERM GOAL #1   Title Patient will demonstrate improved functional independence and decreased disability with improvement of Oswestry score to 40%.   Baseline 58%, 54%   Time 4   Period Weeks   Status On-going   PT LONG TERM GOAL #2   Title Patient will be able to manage pain symptoms with home exercises and stretches independently.    Baseline needs instruction   Time 4   Period Weeks   Status On-going   PT LONG TERM GOAL #3   Title Patient will demonstrate improved functional ability and decreased disability with improved QucikDash score of 55%   Baseline 70%, 90%at re-assessment   Time 4  Period Weeks   Status On-going   PT LONG TERM GOAL #4   Title Patient will report decreased overall pain to less than 3/10 in neck and left arm.    Baseline 4/10, 3/10   Time 4   Period Weeks   Status On-going               Plan - 10/10/15 1545    Clinical Impression Statement Patient continues to have reports of pain, fluctuates in severity.    Pt will benefit from skilled therapeutic intervention in order to improve on the following deficits Pain;Postural dysfunction;Impaired sensation;Decreased strength;Impaired UE functional use;Decreased activity tolerance;Decreased range of motion   Rehab Potential Fair   Clinical Impairments Affecting Rehab Potential chronic pain   PT Frequency 2x / week   PT Duration 4 weeks   PT Treatment/Interventions Therapeutic exercise;Therapeutic activities;Manual techniques;Patient/family  education;Neuromuscular re-education;Passive range of motion   PT Home Exercise Plan patient given chin tucks, back breakers, cervical range of motion, corner stretch to perform at home with handout. Added isometric neck stretngthening exercises and cane AAROM exercises for left shoulder.    Consulted and Agree with Plan of Care Patient        Problem List Patient Active Problem List   Diagnosis Date Noted  . Morbid obesity (HCC) 05/04/2014  . DVT (deep venous thrombosis) (HCC) 05/04/2014  . Shortness of breath 03/21/2014  . Pulmonary embolism 03/21/2014    Les Longmore, PT, MPT, GCS 10/10/2015, 3:49 PM  [place] [name] [address] [place], [address] Phone: [phone]   Fax:  [phone]  Name: Jeff Leonard MRN: [id number] Date of Birth: [birth year]

## 2015-10-12 ENCOUNTER — Ambulatory Visit: Payer: 59 | Admitting: Physical Therapy

## 2015-10-19 ENCOUNTER — Ambulatory Visit: Payer: 59 | Admitting: Physical Therapy

## 2015-10-19 DIAGNOSIS — G8929 Other chronic pain: Secondary | ICD-10-CM

## 2015-10-19 DIAGNOSIS — M25512 Pain in left shoulder: Secondary | ICD-10-CM | POA: Diagnosis not present

## 2015-10-19 DIAGNOSIS — R29898 Other symptoms and signs involving the musculoskeletal system: Secondary | ICD-10-CM

## 2015-10-19 DIAGNOSIS — M542 Cervicalgia: Secondary | ICD-10-CM

## 2015-10-19 DIAGNOSIS — R200 Anesthesia of skin: Secondary | ICD-10-CM

## 2015-10-19 NOTE — Therapy (Signed)
[place] [facility] [address] [place], [address] Phone: [phone]   Fax:  [phone]  Physical Therapy Treatment/ Discharge Note  Patient Details  Name: Jeff Leonard MRN: [id number] Date of Birth: [birth year] Referring Provider: Melrose Nakayama  Encounter Date: 10/19/2015    Past Medical History  Diagnosis Date  . Past heart attack   . Hx of pulmonary embolus     Past Surgical History  Procedure Laterality Date  . Shoulder surgery      left shoulder     There were no vitals filed for this visit.  Visit Diagnosis:  Weakness of left arm  Left shoulder pain  Neck pain, chronic  Numbness of left hand      Subjective Assessment - 10/19/15 1520    Subjective Patient reports severe shoulder/neck pain on the left. Thinks it is more due to the holidays and having a lot of people over.    Pertinent History PE, Glaucoma   Limitations Lifting;House hold activities   Patient Stated Goals to have less pain in neck and left shoulder   Currently in Pain? Yes   Pain Score 7    Pain Location Neck   Pain Orientation Left   Pain Descriptors / Indicators Aching;Sore;Tingling   Pain Type Chronic pain   Pain Onset More than a month ago   Pain Frequency Constant   Multiple Pain Sites No         Left shoulder PA and distal joint mobs, grade II x 3 bouts, shoulder distraction x 2. PROM of L shoulder for pain control and to maintain mobility in flexion and abduction within pain free range.  Cane exercises for shoulder mobility to include, flexion, chest press, extension,  X 1 min each each with increased time required and pain  With each. UE ranger in arc and forward/back motions x 2 min each.    Re-assessed outcome measures. Patient has not made progress toward goals. Continues to have severe pain and significant disability.         PT Education - 10/19/15 1603    Education provided Yes   Education Details  exercises to continue at home.    Person(s) Educated Patient   Methods Explanation   Comprehension Verbalized understanding             PT Long Term Goals - 10/19/15 1605    PT LONG TERM GOAL #1   Title Patient will demonstrate improved functional independence and decreased disability with improvement of Oswestry score to 40%.   Baseline 58%, 54%, 86%   Time 4   Period Weeks   Status Not Met   PT LONG TERM GOAL #2   Title Patient will be able to manage pain symptoms with home exercises and stretches independently.    Baseline needs instruction   Time 4   Period Weeks   Status Achieved   PT LONG TERM GOAL #3   Title Patient will demonstrate improved functional ability and decreased disability with improved QucikDash score of 55%   Baseline 70%, 90%at re-assessment, 86% at discharge   Time 4   Period Weeks   Status Not Met   PT LONG TERM GOAL #4   Title Patient will report decreased overall pain to less than 3/10 in neck and left arm.    Baseline 4/10, 3/10, 7/10 on discharge   Time 4   Period Weeks   Status Not Met  G-Codes - 10/19/15 1607    Functional Assessment Tool Used Katina Dung, Oswestry, Clinical Judgement   Functional Limitation Carrying, moving and handling objects   Carrying, Moving and Handling Objects Current Status [phone]) At least 40 percent but less than 60 percent impaired, limited or restricted   Carrying, Moving and Handling Objects Goal Status ([id number]) At least 1 percent but less than 20 percent impaired, limited or restricted   Carrying, Moving and Handling Objects Discharge Status [phone]) At least 40 percent but less than 60 percent impaired, limited or restricted      Problem List Patient Active Problem List   Diagnosis Date Noted  . Morbid obesity ([place]) 05/04/2014  . DVT (deep venous thrombosis) ([place]) 05/04/2014  . Shortness of breath 03/21/2014  . Pulmonary embolism 03/21/2014    Blayne Garlick, PT, MPT,  GCS 10/19/2015, 4:08 PM  [place] [facility] [address] [place], [address] Phone: [phone]   Fax:  [phone]  Name: Jeff Leonard MRN: [id number] Date of Birth: December 03, 1950

## 2016-02-13 ENCOUNTER — Ambulatory Visit
Admission: RE | Admit: 2016-02-13 | Discharge: 2016-02-13 | Disposition: A | Discharge status: Home / Self Care | Payer: BLUE CROSS/BLUE SHIELD | Source: Ambulatory Visit | Attending: Internal Medicine | Admitting: Internal Medicine

## 2016-02-13 ENCOUNTER — Other Ambulatory Visit: Payer: Self-pay | Admitting: Internal Medicine

## 2016-02-13 DIAGNOSIS — M25562 Pain in left knee: Secondary | ICD-10-CM

## 2016-02-13 DIAGNOSIS — M25462 Effusion, left knee: Secondary | ICD-10-CM | POA: Insufficient documentation

## 2016-04-02 ENCOUNTER — Ambulatory Visit
Admission: RE | Admit: 2016-04-02 | Discharge: 2016-04-02 | Disposition: A | Discharge status: Home / Self Care | Payer: BLUE CROSS/BLUE SHIELD | Source: Ambulatory Visit | Attending: Internal Medicine | Admitting: Internal Medicine

## 2016-04-02 ENCOUNTER — Other Ambulatory Visit: Payer: Self-pay | Admitting: Internal Medicine

## 2016-04-02 DIAGNOSIS — M79662 Pain in left lower leg: Secondary | ICD-10-CM | POA: Insufficient documentation

## 2016-07-30 ENCOUNTER — Other Ambulatory Visit: Payer: Self-pay | Admitting: Neurosurgery

## 2016-07-30 DIAGNOSIS — M79602 Pain in left arm: Secondary | ICD-10-CM

## 2016-08-12 ENCOUNTER — Ambulatory Visit
Admission: RE | Admit: 2016-08-12 | Discharge: 2016-08-12 | Disposition: A | Discharge status: Home / Self Care | Payer: BLUE CROSS/BLUE SHIELD | Source: Ambulatory Visit | Attending: Neurosurgery | Admitting: Neurosurgery

## 2016-08-12 DIAGNOSIS — M4802 Spinal stenosis, cervical region: Secondary | ICD-10-CM | POA: Diagnosis not present

## 2016-08-12 DIAGNOSIS — M79602 Pain in left arm: Secondary | ICD-10-CM | POA: Diagnosis present

## 2016-08-26 ENCOUNTER — Encounter: Payer: Self-pay | Admitting: Anesthesiology

## 2016-08-26 ENCOUNTER — Ambulatory Visit: Payer: BLUE CROSS/BLUE SHIELD | Attending: Anesthesiology | Admitting: Anesthesiology

## 2016-08-26 VITALS — BP 136/87 | HR 58 | Temp 98.7°F | Resp 16 | Ht 73.0 in | Wt 291.0 lb

## 2016-08-26 DIAGNOSIS — Z86711 Personal history of pulmonary embolism: Secondary | ICD-10-CM | POA: Insufficient documentation

## 2016-08-26 DIAGNOSIS — M25512 Pain in left shoulder: Secondary | ICD-10-CM | POA: Diagnosis not present

## 2016-08-26 DIAGNOSIS — M5010 Cervical disc disorder with radiculopathy, unspecified cervical region: Secondary | ICD-10-CM | POA: Diagnosis not present

## 2016-08-26 DIAGNOSIS — M5030 Other cervical disc degeneration, unspecified cervical region: Secondary | ICD-10-CM

## 2016-08-26 DIAGNOSIS — M542 Cervicalgia: Secondary | ICD-10-CM

## 2016-08-26 DIAGNOSIS — H409 Unspecified glaucoma: Secondary | ICD-10-CM | POA: Insufficient documentation

## 2016-08-26 DIAGNOSIS — M5412 Radiculopathy, cervical region: Secondary | ICD-10-CM | POA: Diagnosis not present

## 2016-08-26 DIAGNOSIS — I252 Old myocardial infarction: Secondary | ICD-10-CM | POA: Diagnosis not present

## 2016-08-26 DIAGNOSIS — Z7901 Long term (current) use of anticoagulants: Secondary | ICD-10-CM | POA: Insufficient documentation

## 2016-08-26 DIAGNOSIS — G894 Chronic pain syndrome: Secondary | ICD-10-CM | POA: Insufficient documentation

## 2016-08-26 NOTE — Patient Instructions (Signed)
Trigger Point Injection Trigger points are areas where you have muscle pain. A trigger point injection is a shot given in the trigger point to relieve that pain. A trigger point might feel like a knot in your muscle. It hurts to press on a trigger point. Sometimes the pain spreads out (radiates) to other parts of the body. For example, pressing on a trigger point in your shoulder might cause pain in your arm or neck. You might have one trigger point. Or, you might have more than one. People often have trigger points in their upper back and lower back. They also occur often in the neck and shoulders. Pain from a trigger point lasts for a long time. It can make it hard to keep moving. You might not be able to do the exercise or physical therapy that could help you deal with the pain. A trigger point injection may help. It does not work for everyone. But, it may relieve your pain for a few days or a few months. A trigger point injection does not cure long-lasting (chronic) pain. LET YOUR CAREGIVER KNOW ABOUT:  Any allergies (especially to latex, lidocaine, or steroids).  Blood-thinning medicines that you take. These drugs can lead to bleeding or bruising after an injection. They include:  Aspirin.  Ibuprofen.  Clopidogrel.  Warfarin.  Other medicines you take. This includes all vitamins, herbs, eyedrops, over-the-counter medicines, and creams.  Use of steroids.  Recent infections.  Past problems with numbing medicines.  Bleeding problems.  Surgeries you have had.  Other health problems. RISKS AND COMPLICATIONS A trigger point injection is a safe treatment. However, problems may develop, such as:  Minor side effects usually go away in 1 to 2 days. These may include:  Soreness.  Bruising.  Stiffness.  More serious problems are rare. But, they may include:  Bleeding under the skin (hematoma).  Skin infection.  Breaking off of the needle under your skin.  Lung  puncture.  The trigger point injection may not work for you. BEFORE THE PROCEDURE You may need to stop taking any medicine that thins your blood. This is to prevent bleeding and bruising. Usually these medicines are stopped several days before the injection. No other preparation is needed. PROCEDURE  A trigger point injection can be given in your caregiver's office or in a clinic. Each injection takes 2 minutes or less.  Your caregiver will feel for trigger points. The caregiver may use a marker to circle the area for the injection.  The skin over the trigger point will be washed with a germ-killing (antiseptic) solution.  The caregiver pinches the spot for the injection.  Then, a very thin needle is used for the shot. You may feel pain or a twitching feeling when the needle enters the trigger point.  A numbing solution may be injected into the trigger point. Sometimes a drug to keep down swelling, redness, and warmth (inflammation) is also injected.  Your caregiver moves the needle around the trigger zone until the tightness and twitching goes away.  After the injection, your caregiver may put gentle pressure over the injection site.  Then it is covered with a bandage. AFTER THE PROCEDURE  You can go right home after the injection.  The bandage can be taken off after a few hours.  You may feel sore and stiff for 1 to 2 days.  Go back to your regular activities slowly. Your caregiver may ask you to stretch your muscles. Do not do anything that takes   extra energy for a few days.  Follow your caregiver's instructions to manage and treat other pain.   This information is not intended to replace advice given to you by your health care provider. Make sure you discuss any questions you have with your health care provider.   Document Released: 09/26/2011 Document Revised: 02/01/2013 Document Reviewed: 09/26/2011 Elsevier Interactive Patient Education 2016 [employer] GENERAL RISKS  AND COMPLICATIONS  What are the risk, side effects and possible complications? Generally speaking, most procedures are safe.  However, with any procedure there are risks, side effects, and the possibility of complications.  The risks and complications are dependent upon the sites that are lesioned, or the type of nerve block to be performed.  The closer the procedure is to the spine, the more serious the risks are.  Great care is taken when placing the radio frequency needles, block needles or lesioning probes, but sometimes complications can occur. 1. Infection: Any time there is an injection through the skin, there is a risk of infection.  This is why sterile conditions are used for these blocks.  There are four possible types of infection. 1. Localized skin infection. 2. Central Nervous System Infection-This can be in the form of Meningitis, which can be deadly. 3. Epidural Infections-This can be in the form of an epidural abscess, which can cause pressure inside of the spine, causing compression of the spinal cord with subsequent paralysis. This would require an emergency surgery to decompress, and there are no guarantees that the patient would recover from the paralysis. 4. Discitis-This is an infection of the intervertebral discs.  It occurs in about 1% of discography procedures.  It is difficult to treat and it may lead to surgery.        2. Pain: the needles have to go through skin and soft tissues, will cause soreness.       3. Damage to internal structures:  The nerves to be lesioned may be near blood vessels or    other nerves which can be potentially damaged.       4. Bleeding: Bleeding is more common if the patient is taking blood thinners such as  aspirin, Coumadin, Ticiid, Plavix, etc., or if he/she have some genetic predisposition  such as hemophilia. Bleeding into the spinal canal can cause compression of the spinal  cord with subsequent paralysis.  This would require an emergency  surgery to  decompress and there are no guarantees that the patient would recover from the  paralysis.       5. Pneumothorax:  Puncturing of a lung is a possibility, every time a needle is introduced in  the area of the chest or upper back.  Pneumothorax refers to free air around the  collapsed lung(s), inside of the thoracic cavity (chest cavity).  Another two possible  complications related to a similar event would include: Hemothorax and Chylothorax.   These are variations of the Pneumothorax, where instead of air around the collapsed  lung(s), you may have blood or chyle, respectively.       6. Spinal headaches: They may occur with any procedures in the area of the spine.       7. Persistent CSF (Cerebro-Spinal Fluid) leakage: This is a rare problem, but may occur  with prolonged intrathecal or epidural catheters either due to the formation of a fistulous  track or a dural tear.       8. Nerve damage: By working so close to the spinal cord, there   is always a possibility of  nerve damage, which could be as serious as a permanent spinal cord injury with  paralysis.       9. Death:  Although rare, severe deadly allergic reactions known as "Anaphylactic  reaction" can occur to any of the medications used.      10. Worsening of the symptoms:  We can always make thing worse.  What are the chances of something like this happening? Chances of any of this occuring are extremely low.  By statistics, you have more of a chance of getting killed in a motor vehicle accident: while driving to the hospital than any of the above occurring .  Nevertheless, you should be aware that they are possibilities.  In general, it is similar to taking a shower.  Everybody knows that you can slip, hit your head and get killed.  Does that mean that you should not shower again?  Nevertheless always keep in mind that statistics do not mean anything if you happen to be on the wrong side of them.  Even if a procedure has a 1 (one) in a  1,000,000 (million) chance of going wrong, it you happen to be that one..Also, keep in mind that by statistics, you have more of a chance of having something go wrong when taking medications.  Who should not have this procedure? If you are on a blood thinning medication (e.g. Coumadin, Plavix, see list of "Blood Thinners"), or if you have an active infection going on, you should not have the procedure.  If you are taking any blood thinners, please inform your physician.  How should I prepare for this procedure?  Do not eat or drink anything at least six hours prior to the procedure.  Bring a driver with you .  It cannot be a taxi.  Come accompanied by an adult that can drive you back, and that is strong enough to help you if your legs get weak or numb from the local anesthetic.  Take all of your medicines the morning of the procedure with just enough water to swallow them.  If you have diabetes, make sure that you are scheduled to have your procedure done first thing in the morning, whenever possible.  If you have diabetes, take only half of your insulin dose and notify our nurse that you have done so as soon as you arrive at the clinic.  If you are diabetic, but only take blood sugar pills (oral hypoglycemic), then do not take them on the morning of your procedure.  You may take them after you have had the procedure.  Do not take aspirin or any aspirin-containing medications, at least eleven (11) days prior to the procedure.  They may prolong bleeding.  Wear loose fitting clothing that may be easy to take off and that you would not mind if it got stained with Betadine or blood.  Do not wear any jewelry or perfume  Remove any nail coloring.  It will interfere with some of our monitoring equipment.  NOTE: Remember that this is not meant to be interpreted as a complete list of all possible complications.  Unforeseen problems may occur.  BLOOD THINNERS The following drugs contain aspirin  or other products, which can cause increased bleeding during surgery and should not be taken for 2 weeks prior to and 1 week after surgery.  If you should need take something for relief of minor pain, you may take acetaminophen which is found in Tylenol,m Datril, Anacin-3   and Panadol. It is not blood thinner. The products listed below are.  Do not take any of the products listed below in addition to any listed on your instruction sheet.  A.P.C or A.P.C with Codeine Codeine Phosphate Capsules #3 Ibuprofen Ridaura  ABC compound Congesprin Imuran rimadil  Advil Cope Indocin Robaxisal  Alka-Seltzer Effervescent Pain Reliever and Antacid Coricidin or Coricidin-D  Indomethacin Rufen  Alka-Seltzer plus Cold Medicine Cosprin Ketoprofen S-A-C Tablets  Anacin Analgesic Tablets or Capsules Coumadin Korlgesic Salflex  Anacin Extra Strength Analgesic tablets or capsules CP-2 Tablets Lanoril Salicylate  Anaprox Cuprimine Capsules Levenox Salocol  Anexsia-D Dalteparin Magan Salsalate  Anodynos Darvon compound Magnesium Salicylate Sine-off  Ansaid Dasin Capsules Magsal Sodium Salicylate  Anturane Depen Capsules Marnal Soma  APF Arthritis pain formula Dewitt's Pills Measurin Stanback  Argesic Dia-Gesic Meclofenamic Sulfinpyrazone  Arthritis Bayer Timed Release Aspirin Diclofenac Meclomen Sulindac  Arthritis pain formula Anacin Dicumarol Medipren Supac  Analgesic (Safety coated) Arthralgen Diffunasal Mefanamic Suprofen  Arthritis Strength Bufferin Dihydrocodeine Mepro Compound Suprol  Arthropan liquid Dopirydamole Methcarbomol with Aspirin Synalgos  ASA tablets/Enseals Disalcid Micrainin Tagament  Ascriptin Doan's Midol Talwin  Ascriptin A/D Dolene Mobidin Tanderil  Ascriptin Extra Strength Dolobid Moblgesic Ticlid  Ascriptin with Codeine Doloprin or Doloprin with Codeine Momentum Tolectin  Asperbuf Duoprin Mono-gesic Trendar  Aspergum Duradyne Motrin or Motrin IB Triminicin  Aspirin plain, buffered or  enteric coated Durasal Myochrisine Trigesic  Aspirin Suppositories Easprin Nalfon Trillsate  Aspirin with Codeine Ecotrin Regular or Extra Strength Naprosyn Uracel  Atromid-S Efficin Naproxen Ursinus  Auranofin Capsules Elmiron Neocylate Vanquish  Axotal Emagrin Norgesic Verin  Azathioprine Empirin or Empirin with Codeine Normiflo Vitamin E  Azolid Emprazil Nuprin Voltaren  Bayer Aspirin plain, buffered or children's or timed BC Tablets or powders Encaprin Orgaran Warfarin Sodium  Buff-a-Comp Enoxaparin Orudis Zorpin  Buff-a-Comp with Codeine Equegesic Os-Cal-Gesic   Buffaprin Excedrin plain, buffered or Extra Strength Oxalid   Bufferin Arthritis Strength Feldene Oxphenbutazone   Bufferin plain or Extra Strength Feldene Capsules Oxycodone with Aspirin   Bufferin with Codeine Fenoprofen Fenoprofen Pabalate or Pabalate-SF   Buffets II Flogesic Panagesic   Buffinol plain or Extra Strength Florinal or Florinal with Codeine Panwarfarin   Buf-Tabs Flurbiprofen Penicillamine   Butalbital Compound Four-way cold tablets Penicillin   Butazolidin Fragmin Pepto-Bismol   Carbenicillin Geminisyn Percodan   Carna Arthritis Reliever Geopen Persantine   Carprofen Gold's salt Persistin   Chloramphenicol Goody's Phenylbutazone   Chloromycetin Haltrain Piroxlcam   Clmetidine heparin Plaquenil   Cllnoril Hyco-pap Ponstel   Clofibrate Hydroxy chloroquine Propoxyphen         Before stopping any of these medications, be sure to consult the physician who ordered them.  Some, such as Coumadin (Warfarin) are ordered to prevent or treat serious conditions such as "deep thrombosis", "pumonary embolisms", and other heart problems.  The amount of time that you may need off of the medication may also vary with the medication and the reason for which you were taking it.  If you are taking any of these medications, please make sure you notify your pain physician before you undergo any procedures.          

## 2016-08-26 NOTE — Progress Notes (Signed)
Safety precautions to be maintained throughout the outpatient stay will include: orient to surroundings, keep bed in low position, maintain call bell within reach at all times, provide assistance with transfer out of bed and ambulation.  

## 2016-08-27 NOTE — Progress Notes (Signed)
Subjective:  Patient ID: Jeff Leonard, male    DOB: Mar 15, 1951  Age: 65 y.o. MRN: [id number]  CC: Neck Pain and Shoulder Pain (left)      PROCEDURE:None  HPI Jeff GheeGlenn Leonard presents for a new patient evaluation. He is a very pleasant 65 year old black male with a long-standing history greater than 5 years of neck pain and left shoulder pain. He states that this was a work related accident back in April 2011 and has gradually gotten worse with a maximum VAS of 10 best a 4 and now a 7 on average. Does not appear to be influenced by time of day but is worse with lifting and improved by hot packs medication management. He has associated numbness and tingling that effects the left thumb the second third digit on the left hand as well. He denies any problems with strength affecting the left forearm and hand. The pain quality is described as aching agonizing annoying with shooting characteristics and occasionally throbbing. He's had a previous orthopedic evaluation and an MRI as enclosed.  History Jeff Leonard has a past medical history of Glaucoma; pulmonary embolus; Myocardial infarction; and Past heart attack.   He has a past surgical history that includes Shoulder surgery.   His family history includes Asthma in his maternal grandfather; Cancer in his paternal uncle and paternal uncle; Heart disease (age of onset: 3861) in his father; Hypertension in his mother.He reports that he has never smoked. He has never used smokeless tobacco. He reports that he does not drink alcohol or use drugs.  No results found for this or any previous visit.  No results found for: TOXASSSELUR  Outpatient Medications Prior to Visit  Medication Sig Dispense Refill  . Brinzolamide-Brimonidine (SIMBRINZA) 1-0.2 % SUSP Apply 1 drop to eye daily.    . rivaroxaban (XARELTO) 20 MG TABS tablet Take 20 mg by mouth daily with supper.    . timolol (BETIMOL) 0.25 % ophthalmic solution Apply 1 drop to eye at bedtime.    . travoprost,  benzalkonium, (TRAVATAN) 0.004 % ophthalmic solution Place 1 drop into both eyes at bedtime.    Marland Kitchen. HYDROcodone-acetaminophen (VICODIN) 5-500 MG per tablet Take 1 tablet by mouth every 8 (eight) hours as needed for pain. (Patient not taking: Reported on 08/26/2016) 15 tablet 0   No facility-administered medications prior to visit.    Lab Results  Component Value Date   WBC 9.2 07/20/2013   HGB 15.1 07/20/2013   HCT 44.2 07/20/2013   PLT 169 07/20/2013   GLUCOSE 100 (H) 07/20/2013   CHOL 132 07/20/2013   TRIG 65 07/20/2013   HDL 39 (L) 07/20/2013   LDLCALC 80 07/20/2013   ALT 21 07/20/2013   AST 19 07/20/2013   NA 137 07/20/2013   K 4.5 07/20/2013   CL 105 07/20/2013   CREATININE 1.18 07/21/2013   BUN 16 07/20/2013   CO2 27 07/20/2013   INR 1.0 07/20/2013    --------------------------------------------------------------------------------------------------------------------- Mr Cervical Spine Wo Contrast  Result Date: 08/12/2016 CLINICAL DATA:  Left arm pain EXAM: MRI CERVICAL SPINE WITHOUT CONTRAST TECHNIQUE: Multiplanar, multisequence MR imaging of the cervical spine was performed. No intravenous contrast was administered. COMPARISON:  Cervical MRI 06/15/2014 FINDINGS: Alignment: Mild retrolisthesis C3-4.  Remaining alignment normal. Vertebrae: Negative for fracture or mass.  Normal bone marrow. Cord: Normal signal and morphology. Posterior Fossa, vertebral arteries, paraspinal tissues: Negative Disc levels: C2-3:  Small central disc protrusion without stenosis. C3-4: Moderate disc degeneration with disc bulging and diffuse uncinate spurring. This  is causing moderate foraminal encroachment bilaterally and mild spinal stenosis. No change from the prior MRI. C4-5:  Mild disc degeneration without stenosis C5-6: Disc degeneration with diffuse uncinate spurring. Mild spinal stenosis and mild foraminal stenosis bilaterally. No change from the prior study. C6-7: Disc degeneration and diffuse  uncinate spurring. Mild spinal stenosis and mild foraminal stenosis bilaterally. C7-T1:  Negative IMPRESSION: Cervical spondylosis at multiple levels causing spinal and foraminal stenosis. No significant change from the prior MRI. Electronically Signed   By: Marlan Palau M.D.   On: 08/12/2016 15:05       ---------------------------------------------------------------------------------------------------------------------- Past Medical History:  Diagnosis Date  . Glaucoma   . Hx of pulmonary embolus   . Myocardial infarction   . Past heart attack     Past Surgical History:  Procedure Laterality Date  . SHOULDER SURGERY     left shoulder     Family History  Problem Relation Age of Onset  . Heart disease Father 28  . Hypertension Mother   . Asthma Maternal Grandfather   . Cancer Paternal Uncle     lung  . Cancer Paternal Uncle     Social History  Substance Use Topics  . Smoking status: Never Smoker  . Smokeless tobacco: Never Used  . Alcohol use No    ---------------------------------------------------------------------------------------------------------------------- Social History   Social History  . Marital status: Married    Spouse name: N/A  . Number of children: N/A  . Years of education: N/A   Social History Main Topics  . Smoking status: Never Smoker  . Smokeless tobacco: Never Used  . Alcohol use No  . Drug use: No  . Sexual activity: Not Asked   Other Topics Concern  . None   Social History Narrative  . None    Scheduled Meds: Continuous Infusions: PRN Meds:.   BP 136/87   Pulse (!) 58   Temp 98.7 F (37.1 C) (Oral)   Resp 16   Ht 6\' 1"  (1.854 m)   Wt 291 lb (132 kg)   SpO2 100%   BMI 38.39 kg/m    BP Readings from Last 3 Encounters:  08/26/16 136/87  05/04/14 130/80  04/18/14 130/78     Wt Readings from Last 3 Encounters:  08/26/16 291 lb (132 kg)  05/04/14 296 lb 12 oz (134.6 kg)  04/18/14 296 lb (134.3 kg)      ----------------------------------------------------------------------------------------------------------------------  ROS Review of Systems  Cardiac: History of heart attack on blood thinner Xarelto Ulnar: Negative Neurologic: As above Psychologic: Negative for drug abuse suicidal ideation GI: Negative  Objective:  BP 136/87   Pulse (!) 58   Temp 98.7 F (37.1 C) (Oral)   Resp 16   Ht 6\' 1"  (1.854 m)   Wt 291 lb (132 kg)   SpO2 100%   BMI 38.39 kg/m   Physical Exam Patient is alert cooperative oriented. He is a good historian. Pupils are equally round reactive to light extraocular muscles intact X line heart is regular rate and rhythm without murmur Lungs are clear to also dictation He has good range of motion at the atlantooccipital joint No trigger points are noted in the bilateral trapezius but he does have a trigger point in the left lateral deltoid region.\ His strength appears to be well-preserved both proximal and distal in the upper extremities and at the wrists.     Assessment & Plan:   Katherine was seen today for neck pain and shoulder pain.  Diagnoses and all orders for  this visit:  DDD (degenerative disc disease), cervical  Cervical radiculitis -     ToxASSURE Select 13 (MW), Urine -     Ambulatory referral to Physical Therapy  Cervicalgia -     TRIGGER POINT INJECTION; Future     ----------------------------------------------------------------------------------------------------------------------  Problem List Items Addressed This Visit    None    Visit Diagnoses    DDD (degenerative disc disease), cervical    -  Primary   Relevant Medications   HYDROcodone-acetaminophen (NORCO/VICODIN) 5-325 MG tablet   Cervical radiculitis       Relevant Orders   ToxASSURE Select 13 (MW), Urine   Ambulatory referral to Physical Therapy   Cervicalgia       Relevant Orders   TRIGGER POINT INJECTION       ----------------------------------------------------------------------------------------------------------------------  1. DDD (degenerative disc disease), cervical I want him to continue with physical therapy exercises as reviewed with him in detail today. He may be a good candidate for cervical traction and evaluation.  2. Cervical radiculitis As above - ToxASSURE Select 13 (MW), Urine - Ambulatory referral to Physical Therapy  3. Cervicalgia We'll plan on a trigger point injection at his next visit as fully described to him in detail today. I would like him to stop his Xarelto if this is okay with his primary care or cardiologist. He will check into this and advance of having his procedure. In the meantime he is to continue with current medication management. - TRIGGER POINT INJECTION; Future    ----------------------------------------------------------------------------------------------------------------------  I am having Jeff Leonard maintain his HYDROcodone-acetaminophen, rivaroxaban, timolol, Brinzolamide-Brimonidine, travoprost (benzalkonium), and HYDROcodone-acetaminophen.   Meds ordered this encounter  Medications  . HYDROcodone-acetaminophen (NORCO/VICODIN) 5-325 MG tablet    Sig: Take 1 tablet by mouth 3 (three) times daily as needed for moderate pain.       Follow-up: Return in about 2 weeks (around 09/09/2016) for evaluation, procedure.    Yevette EdwardsJames G Pasquale Matters, MD  This dictation was performed utilizing Dragon voice recognition software.  Please excuse any unintentional or mistaken typographical errors as a result of its unedited utilization.

## 2016-08-31 LAB — TOXASSURE SELECT 13 (MW), URINE

## 2016-10-01 ENCOUNTER — Encounter: Payer: Self-pay | Admitting: Anesthesiology

## 2016-10-01 ENCOUNTER — Ambulatory Visit: Payer: BLUE CROSS/BLUE SHIELD | Attending: Anesthesiology | Admitting: Anesthesiology

## 2016-10-01 VITALS — BP 135/86 | HR 65 | Temp 97.8°F | Resp 16 | Ht 73.0 in | Wt 290.0 lb

## 2016-10-01 DIAGNOSIS — M542 Cervicalgia: Secondary | ICD-10-CM | POA: Diagnosis not present

## 2016-10-01 DIAGNOSIS — Z86711 Personal history of pulmonary embolism: Secondary | ICD-10-CM | POA: Insufficient documentation

## 2016-10-01 DIAGNOSIS — H409 Unspecified glaucoma: Secondary | ICD-10-CM | POA: Insufficient documentation

## 2016-10-01 DIAGNOSIS — G894 Chronic pain syndrome: Secondary | ICD-10-CM | POA: Diagnosis not present

## 2016-10-01 DIAGNOSIS — M5010 Cervical disc disorder with radiculopathy, unspecified cervical region: Secondary | ICD-10-CM | POA: Insufficient documentation

## 2016-10-01 DIAGNOSIS — M5412 Radiculopathy, cervical region: Secondary | ICD-10-CM

## 2016-10-01 DIAGNOSIS — I252 Old myocardial infarction: Secondary | ICD-10-CM | POA: Insufficient documentation

## 2016-10-01 MED ORDER — ROPIVACAINE HCL 2 MG/ML IJ SOLN
INTRAMUSCULAR | Status: AC
  Administered 2016-10-01: 16:00:00
  Filled 2016-10-01: qty 10

## 2016-10-01 MED ORDER — DEXAMETHASONE SODIUM PHOSPHATE 4 MG/ML IJ SOLN
INTRAMUSCULAR | Status: AC
  Administered 2016-10-01: 16:00:00
  Filled 2016-10-01: qty 2

## 2016-10-01 NOTE — Patient Instructions (Signed)
Pain Management Discharge Instructions  General Discharge Instructions :  If you need to reach your doctor call: Monday-Friday 8:00 am - 4:00 pm at [phone] or toll free [phone].  After clinic hours [phone] to have operator reach doctor.  Bring all of your medication bottles to all your appointments in the pain clinic.  To cancel or reschedule your appointment with Pain Management please remember to call 24 hours in advance to avoid a fee.  Refer to the educational materials which you have been given on: General Risks, I had my Procedure. Discharge Instructions, Post Sedation.  Post Procedure Instructions:  The drugs you were given will stay in your system until tomorrow, so for the next 24 hours you should not drive, make any legal decisions or drink any alcoholic beverages.  You may eat anything you prefer, but it is better to start with liquids then soups and crackers, and gradually work up to solid foods.  Please notify your doctor immediately if you have any unusual bleeding, trouble breathing or pain that is not related to your normal pain.  Depending on the type of procedure that was done, some parts of your body may feel week and/or numb.  This usually clears up by tonight or the next day.  Walk with the use of an assistive device or accompanied by an adult for the 24 hours.  You may use ice on the affected area for the first 24 hours.  Put ice in a Ziploc bag and cover with a towel and place against area 15 minutes on 15 minutes off.  You may switch to heat after 24 hours.Trigger Point Injection Trigger points are areas where you have pain. A trigger point injection is a shot given in the trigger point to help relieve pain for a few days to a few months. Common places for trigger points include:  The neck.  The shoulders.  The upper back.  The lower back. A trigger point injection will not cure long-lasting (chronic) pain permanently. These injections do not  always work for every person, but for some people they can help to relieve pain for a few days to a few months. Tell a health care provider about:  Any allergies you have.  All medicines you are taking, including vitamins, herbs, eye drops, creams, and over-the-counter medicines.  Any problems you or family members have had with anesthetic medicines.  Any blood disorders you have.  Any surgeries you have had.  Any medical conditions you have. What are the risks? Generally, this is a safe procedure. However, problems may occur, including:  Infection.  Bleeding.  Allergic reaction to the injected medicine.  Irritation of the skin around the injection site. What happens before the procedure?  Ask your health care provider about changing or stopping your regular medicines. This is especially important if you are taking diabetes medicines or blood thinners. What happens during the procedure?  Your health care provider will feel for trigger points. A marker may be used to circle the area for the injection.  The skin over the trigger point will be washed with a germ-killing (antiseptic) solution.  A thin needle is used for the shot. You may feel pain or a twitching feeling when the needle enters the trigger point.  A numbing solution may be injected into the trigger point. Sometimes a medicine to keep down swelling, redness, and warmth (inflammation) is also injected.  Your health care provider may move the needle around the area where the trigger   point is located until the tightness and twitching goes away.  After the injection, your health care provider may put gentle pressure over the injection site.  The injection site will be covered with a bandage (dressing). The procedure may vary among health care providers and hospitals. What happens after the procedure?  The dressing can be taken off in a few hours or as told by your health care provider.  You may feel sore and stiff  for 1-2 days. This information is not intended to replace advice given to you by your health care provider. Make sure you discuss any questions you have with your health care provider. Document Released: 09/26/2011 Document Revised: 06/09/2016 Document Reviewed: 03/27/2015 Elsevier Interactive Patient Education  2017 [employer]  

## 2016-10-01 NOTE — Progress Notes (Signed)
Safety precautions to be maintained throughout the outpatient stay will include: orient to surroundings, keep bed in low position, maintain call bell within reach at all times, provide assistance with transfer out of bed and ambulation.  

## 2016-10-02 ENCOUNTER — Telehealth: Payer: Self-pay | Admitting: *Deleted

## 2016-10-02 NOTE — Progress Notes (Addendum)
Patient ID: Jeff Leonard, male    DOB: 08-Feb-1951  Age: 65 y.o. MRN: [id number]  CC: Left shoulder pain     PROCEDURE:Left lateral deltoid trigger point injection  HPI Jeff Leonard presents for reevaluation today. He was last seen approximately 1 month ago. The quality characteristic condition region of his pain have been stable in nature. He desires to proceed with a left lateral deltoid trigger point injection today. Otherwise he continues to have some numbness and tingling affecting the thumb index and middle finger on the left side but no weakness noted.   By history he is a very pleasant 65 year old black male with a long-standing history greater than 5 years of neck pain and left shoulder pain. He states that this was a work related accident back in April 2011 and has gradually gotten worse with a maximum VAS of 10 best a 4 and now a 7 on average. Does not appear to be influenced by time of day but is worse with lifting and improved by hot packs medication management. He has associated numbness and tingling that effects the left thumb the second third digit on the left hand as well. He denies any problems with strength affecting the left forearm and hand. The pain quality is described as aching agonizing annoying with shooting characteristics and occasionally throbbing. He's had a previous orthopedic evaluation and an MRI as enclosed.  History Kilo has a past medical history of Glaucoma; pulmonary embolus; Myocardial infarction; and Past heart attack.   He has a past surgical history that includes Shoulder surgery.   His family history includes Asthma in his maternal grandfather; Cancer in his paternal uncle and paternal uncle; Heart disease (age of onset: 41) in his father; Hypertension in his mother.He reports that he has never smoked. He has never used smokeless tobacco. He reports that he does not drink alcohol or use drugs.  No results found for this or any previous  visit.  [facility] Select 13  Date Value Ref Range Status  08/26/2016 FINAL  Final    Comment:    ==================================================================== [facility] SELECT 13 (MW) ==================================================================== Test                             Result       Flag       Units Drug Absent but Declared for Prescription Verification   Hydrocodone                    Not Detected UNEXPECTED ng/mg creat ==================================================================== Test                      Result    Flag   Units      Ref Range   Creatinine              156              mg/dL      >=40 ==================================================================== Declared Medications:  The flagging and interpretation on this report are based on the  following declared medications.  Unexpected results may arise from  inaccuracies in the declared medications.  **Note: The testing scope of this panel includes these medications:  Hydrocodone (Vicodin)  **Note: The testing scope of this panel does not include following  reported medications:  Acetaminophen (Vicodin)  Brinzolamide  Rivaroxaban (Xarelto)  Timolol  Travoprost (Travatan) ==================================================================== For clinical consultation, please call [phone]. ====================================================================  Outpatient Medications Prior to Visit  Medication Sig Dispense Refill  . HYDROcodone-acetaminophen (NORCO/VICODIN) 5-325 MG tablet Take 1 tablet by mouth 3 (three) times daily as needed for moderate pain.    Marland Kitchen HYDROcodone-acetaminophen (VICODIN) 5-500 MG per tablet Take 1 tablet by mouth every 8 (eight) hours as needed for pain. 15 tablet 0  . rivaroxaban (XARELTO) 20 MG TABS tablet Take 20 mg by mouth daily with supper.    . timolol (BETIMOL) 0.25 % ophthalmic solution Apply 1 drop to eye at bedtime.    . travoprost,  benzalkonium, (TRAVATAN) 0.004 % ophthalmic solution Place 1 drop into both eyes at bedtime.    . Brinzolamide-Brimonidine (SIMBRINZA) 1-0.2 % SUSP Apply 1 drop to eye daily.     No facility-administered medications prior to visit.    Lab Results  Component Value Date   WBC 9.2 07/20/2013   HGB 15.1 07/20/2013   HCT 44.2 07/20/2013   PLT 169 07/20/2013   GLUCOSE 100 (H) 07/20/2013   CHOL 132 07/20/2013   TRIG 65 07/20/2013   HDL 39 (L) 07/20/2013   LDLCALC 80 07/20/2013   ALT 21 07/20/2013   AST 19 07/20/2013   NA 137 07/20/2013   K 4.5 07/20/2013   CL 105 07/20/2013   CREATININE 1.18 07/21/2013   BUN 16 07/20/2013   CO2 27 07/20/2013   INR 1.0 07/20/2013    --------------------------------------------------------------------------------------------------------------------- Mr Cervical Spine Wo Contrast  Result Date: 08/12/2016 CLINICAL DATA:  Left arm pain EXAM: MRI CERVICAL SPINE WITHOUT CONTRAST TECHNIQUE: Multiplanar, multisequence MR imaging of the cervical spine was performed. No intravenous contrast was administered. COMPARISON:  Cervical MRI 06/15/2014 FINDINGS: Alignment: Mild retrolisthesis C3-4.  Remaining alignment normal. Vertebrae: Negative for fracture or mass.  Normal bone marrow. Cord: Normal signal and morphology. Posterior Fossa, vertebral arteries, paraspinal tissues: Negative Disc levels: C2-3:  Small central disc protrusion without stenosis. C3-4: Moderate disc degeneration with disc bulging and diffuse uncinate spurring. This is causing moderate foraminal encroachment bilaterally and mild spinal stenosis. No change from the prior MRI. C4-5:  Mild disc degeneration without stenosis C5-6: Disc degeneration with diffuse uncinate spurring. Mild spinal stenosis and mild foraminal stenosis bilaterally. No change from the prior study. C6-7: Disc degeneration and diffuse uncinate spurring. Mild spinal stenosis and mild foraminal stenosis bilaterally. C7-T1:  Negative  IMPRESSION: Cervical spondylosis at multiple levels causing spinal and foraminal stenosis. No significant change from the prior MRI. Electronically Signed   By: Marlan Palau M.D.   On: 08/12/2016 15:05       ---------------------------------------------------------------------------------------------------------------------- Past Medical History:  Diagnosis Date  . Glaucoma   . Hx of pulmonary embolus   . Myocardial infarction   . Past heart attack     Past Surgical History:  Procedure Laterality Date  . SHOULDER SURGERY     left shoulder     Family History  Problem Relation Age of Onset  . Heart disease Father 7  . Hypertension Mother   . Asthma Maternal Grandfather   . Cancer Paternal Uncle     lung  . Cancer Paternal Uncle     Social History  Substance Use Topics  . Smoking status: Never Smoker  . Smokeless tobacco: Never Used  . Alcohol use No    ---------------------------------------------------------------------------------------------------------------------- Social History   Social History  . Marital status: Married    Spouse name: N/A  . Number of children: N/A  . Years of education: N/A   Social History Main Topics  . Smoking status: Never  Smoker  . Smokeless tobacco: Never Used  . Alcohol use No  . Drug use: No  . Sexual activity: Not Asked   Other Topics Concern  . None   Social History Narrative  . None    Scheduled Meds: Continuous Infusions: PRN Meds:.   BP 135/86   Pulse 65   Temp 97.8 F (36.6 C) (Oral)   Resp 16   Ht 6\' 1"  (1.854 m)   Wt 290 lb (131.5 kg)   SpO2 100%   BMI 38.26 kg/m    BP Readings from Last 3 Encounters:  10/01/16 135/86  08/26/16 136/87  05/04/14 130/80     Wt Readings from Last 3 Encounters:  10/01/16 290 lb (131.5 kg)  08/26/16 291 lb (132 kg)  05/04/14 296 lb 12 oz (134.6 kg)      ----------------------------------------------------------------------------------------------------------------------  ROS Review of Systems  Cardiac: No angina GI: No constipation Objective:  BP 135/86   Pulse 65   Temp 97.8 F (36.6 C) (Oral)   Resp 16   Ht 6\' 1"  (1.854 m)   Wt 290 lb (131.5 kg)   SpO2 100%   BMI 38.26 kg/m   Physical Exam Patient is alert cooperative oriented. He is a good historian. Pupils are equally round reactive to light extraocular muscles intact  heart is regular rate and rhythm without murmur Lungs are clear to also dictation He has good range of motion at the atlantooccipital joint He does have a trigger point in the left lateral deltoid and good range of motion at the glenohumeral joint with good grip strength bilaterally   Assessment & Plan:      ----------------------------------------------------------------------------------------------------------------------  Problem List Items Addressed This Visit    None    Visit Diagnoses    Cervical radiculitis    -  Primary   Relevant Medications   gabapentin (NEURONTIN) 300 MG capsule   Cervicalgia          ----------------------------------------------------------------------------------------------------------------------  1. DDD (degenerative disc disease), cervical I want him to continue with physical therapy exercises as reviewed with him in detail today.  2. Cervical radiculitis   3. Cervicalgia We will proceed with a trigger point injection today. The risks and benefits been reviewed and all questions answered. Continue physical therapy with return to clinic in 1-2 months for reevaluation and possible repeat injection  ----------------------------------------------------------------------------------------------------------------------  I am having [name] maintain his HYDROcodone-acetaminophen, rivaroxaban, timolol, Brinzolamide-Brimonidine, travoprost  (benzalkonium), HYDROcodone-acetaminophen, Vitamin D (Ergocalciferol), gabapentin, timolol, timolol, TRAVATAN Z, HYDROcodone-acetaminophen, rivaroxaban, Brinzolamide-Brimonidine, and travoprost (benzalkonium). We administered dexamethasone and ropivacaine (PF) 2 mg/mL (0.2%).   Meds ordered this encounter  Medications  . Vitamin D, Ergocalciferol, (DRISDOL) 50000 units CAPS capsule    Sig: Take by mouth.  . gabapentin (NEURONTIN) 300 MG capsule    Sig: Take 300 mg by mouth.  . timolol (TIMOPTIC) 0.25 % ophthalmic solution    Sig: Apply to eye.  . timolol (TIMOPTIC) 0.5 % ophthalmic solution  . TRAVATAN Z 0.004 % SOLN ophthalmic solution  . HYDROcodone-acetaminophen (NORCO/VICODIN) 5-325 MG tablet    Sig: Take by mouth.  . rivaroxaban (XARELTO) 20 MG TABS tablet    Sig: Take 20 mg by mouth.  . Brinzolamide-Brimonidine (SIMBRINZA) 1-0.2 % SUSP    Sig: INSTILL 1 DROP IN BOTH EYES BID  . travoprost, benzalkonium, (TRAVATAN) 0.004 % ophthalmic solution    Sig: Apply to eye.  Marland Kitchen. dexamethasone (DECADRON) 4 MG/ML injection    Jarold MottoPatterson, Delores: cabinet override  . ropivacaine (PF) 2  mg/mL (0.2%) (NAROPIN) 2 MG/ML injection    Jarold MottoPatterson, Delores: cabinet override    Trigger point to the left lateral deltoid. Trigger point injection: The area overlying the aforementioned trigger point was prepped with alcohol. It was then injected with a 25-gauge needle with 8 cc of ropivacaine 0.2% and Decadron 8 mg at each site after negative aspiration for heme. This was performed after informed consent was obtained and risks and benefits reviewed. She tolerated this procedure without difficulty and was convalesced and discharged to home in stable condition for follow-up as mentioned.  @Cranford Blessinger, MD@    Follow-up: Return for procedure, evaluation.    Yevette EdwardsJames G Alfred Harrel, MD  This dictation was performed utilizing Dragon voice recognition software.  Please excuse any unintentional or mistaken typographical  errors as a result of its unedited utilization.

## 2016-10-02 NOTE — Telephone Encounter (Signed)
Patient denies any problems post TPI.

## 2016-10-30 ENCOUNTER — Ambulatory Visit: Payer: Medicare HMO | Attending: Anesthesiology | Admitting: Anesthesiology

## 2016-10-30 ENCOUNTER — Encounter: Payer: Self-pay | Admitting: Anesthesiology

## 2016-10-30 VITALS — BP 126/83 | HR 77 | Temp 98.0°F | Resp 18 | Ht 73.0 in | Wt 285.0 lb

## 2016-10-30 DIAGNOSIS — M542 Cervicalgia: Secondary | ICD-10-CM | POA: Diagnosis not present

## 2016-10-30 DIAGNOSIS — M5412 Radiculopathy, cervical region: Secondary | ICD-10-CM

## 2016-10-30 DIAGNOSIS — H409 Unspecified glaucoma: Secondary | ICD-10-CM | POA: Diagnosis not present

## 2016-10-30 DIAGNOSIS — G894 Chronic pain syndrome: Secondary | ICD-10-CM | POA: Diagnosis not present

## 2016-10-30 DIAGNOSIS — Z86711 Personal history of pulmonary embolism: Secondary | ICD-10-CM | POA: Insufficient documentation

## 2016-10-30 DIAGNOSIS — M791 Myalgia: Secondary | ICD-10-CM | POA: Diagnosis not present

## 2016-10-30 DIAGNOSIS — M25512 Pain in left shoulder: Secondary | ICD-10-CM | POA: Insufficient documentation

## 2016-10-30 DIAGNOSIS — M5030 Other cervical disc degeneration, unspecified cervical region: Secondary | ICD-10-CM | POA: Diagnosis not present

## 2016-10-30 DIAGNOSIS — I252 Old myocardial infarction: Secondary | ICD-10-CM | POA: Insufficient documentation

## 2016-10-30 DIAGNOSIS — Z7901 Long term (current) use of anticoagulants: Secondary | ICD-10-CM | POA: Diagnosis not present

## 2016-10-30 DIAGNOSIS — Z79899 Other long term (current) drug therapy: Secondary | ICD-10-CM | POA: Diagnosis not present

## 2016-10-30 DIAGNOSIS — M5010 Cervical disc disorder with radiculopathy, unspecified cervical region: Secondary | ICD-10-CM | POA: Insufficient documentation

## 2016-10-30 DIAGNOSIS — M7918 Myalgia, other site: Secondary | ICD-10-CM

## 2016-10-30 MED ORDER — ROPIVACAINE HCL 2 MG/ML IJ SOLN: 10.0000 mL | Freq: Once | INTRAMUSCULAR | Status: DC

## 2016-10-30 MED ORDER — DEXAMETHASONE SODIUM PHOSPHATE 4 MG/ML IJ SOLN
INTRAMUSCULAR | Status: AC
  Administered 2016-10-30: 15:00:00
  Administered 2016-10-30: 8 mg
  Filled 2016-10-30: qty 2

## 2016-10-30 MED ORDER — DEXAMETHASONE SODIUM PHOSPHATE 10 MG/ML IJ SOLN
10.0000 mg | Freq: Once | INTRAMUSCULAR | Status: DC
  Filled 2016-10-30: qty 1

## 2016-10-30 MED ORDER — ROPIVACAINE HCL 2 MG/ML IJ SOLN
INTRAMUSCULAR | Status: AC
Start: 2016-10-30 — End: 2016-10-30
  Administered 2016-10-30: 15:00:00
  Filled 2016-10-30: qty 10

## 2016-10-30 NOTE — Patient Instructions (Signed)
Epidural Steroid Injection Patient Information  Description: The epidural space surrounds the nerves as they exit the spinal cord.  In some patients, the nerves can be compressed and inflamed by a bulging disc or a tight spinal canal (spinal stenosis).  By injecting steroids into the epidural space, we can bring irritated nerves into direct contact with a potentially helpful medication.  These steroids act directly on the irritated nerves and can reduce swelling and inflammation which often leads to decreased pain.  Epidural steroids may be injected anywhere along the spine and from the neck to the low back depending upon the location of your pain.   After numbing the skin with local anesthetic (like Novocaine), a small needle is passed into the epidural space slowly.  You may experience a sensation of pressure while this is being done.  The entire block usually last less than 10 minutes.  Conditions which may be treated by epidural steroids:   Low back and leg pain  Neck and arm pain  Spinal stenosis  Post-laminectomy syndrome  Herpes zoster (shingles) pain  Pain from compression fractures  Preparation for the injection:  1. Do not eat any solid food or dairy products within 8 hours of your appointment.  2. You may drink clear liquids up to 3 hours before appointment.  Clear liquids include water, black coffee, juice or soda.  No milk or cream please. 3. You may take your regular medication, including pain medications, with a sip of water before your appointment  Diabetics should hold regular insulin (if taken separately) and take 1/2 normal NPH dos the morning of the procedure.  Carry some sugar containing items with you to your appointment. 4. A driver must accompany you and be prepared to drive you home after your procedure.  5. Bring all your current medications with your. 6. An IV may be inserted and sedation may be given at the discretion of the physician.   7. A blood pressure  cuff, EKG and other monitors will often be applied during the procedure.  Some patients may need to have extra oxygen administered for a short period. 8. You will be asked to provide medical information, including your allergies, prior to the procedure.  We must know immediately if you are taking blood thinners (like Coumadin/Warfarin)  Or if you are allergic to IV iodine contrast (dye). We must know if you could possible be pregnant.  Possible side-effects:  Bleeding from needle site  Infection (rare, may require surgery)  Nerve injury (rare)  Numbness & tingling (temporary)  Difficulty urinating (rare, temporary)  Spinal headache ( a headache worse with upright posture)  Light -headedness (temporary)  Pain at injection site (several days)  Decreased blood pressure (temporary)  Weakness in arm/leg (temporary)  Pressure sensation in back/neck (temporary)  Call if you experience:  Fever/chills associated with headache or increased back/neck pain.  Headache worsened by an upright position.  New onset weakness or numbness of an extremity below the injection site  Hives or difficulty breathing (go to the emergency room)  Inflammation or drainage at the infection site  Severe back/neck pain  Any new symptoms which are concerning to you  Please note:  Although the local anesthetic injected can often make your back or neck feel good for several hours after the injection, the pain will likely return.  It takes 3-7 days for steroids to work in the epidural space.  You may not notice any pain relief for at least that one week.    If effective, we will often do a series of three injections spaced 3-6 weeks apart to maximally decrease your pain.  After the initial series, we generally will wait several months before considering a repeat injection of the same type.  If you have any questions, please call [phone] [facility]  Stop Xarelto 3  days before your epidural.  You may be sore after today's injection. You may apply an ice pack to the injection site today, 15 minutes on, 15 minutes off. Tomorrow, you may use heat.

## 2016-10-30 NOTE — Progress Notes (Signed)
Safety precautions to be maintained throughout the outpatient stay will include: orient to surroundings, keep bed in low position, maintain call bell within reach at all times, provide assistance with transfer out of bed and ambulation.  

## 2016-10-31 ENCOUNTER — Telehealth: Payer: Self-pay

## 2016-10-31 NOTE — Progress Notes (Signed)
Patient ID: Jeff Leonard, male    DOB: 08/15/1951  Age: 66 y.o. MRN: [id number]  CC: Left shoulder pain  Service Provided on Last Visit: Procedure (left lateral deltoid trigger point)  PROCEDURE:Left lateral deltoid trigger point injection  HPI Jeff Leonard presents for reevaluation. He was last seen 1 month ago and had his first left lateral deltoid injection. He states that he had complete relief of his shoulder pain however the numbness affecting his fingers on the left hand did not change. He has had recurrence of the same quality pain in the left shoulder. He also notes that the pain does present when he raises his left shoulder with hand above shoulder level. Otherwise he is in his usual state of health at this time. His neck pain has remained stable in nature and strength to the left hand has remained stable.  He uses sparingly the hydrocodone does not need a refill today and we've reviewed the practitioner database and it is appropriate.  By history he is a very pleasant 66 year old black male with a long-standing history greater than 5 years of neck pain and left shoulder pain. He states that this was a work related accident back in April 2011 and has gradually gotten worse with a maximum VAS of 10 best a 4 and now a 7 on average. Does not appear to be influenced by time of day but is worse with lifting and improved by hot packs medication management. He has associated numbness and tingling that effects the left thumb the second third digit on the left hand as well. He denies any problems with strength affecting the left forearm and hand. The pain quality is described as aching agonizing annoying with shooting characteristics and occasionally throbbing. He's had a previous orthopedic evaluation and an MRI as enclosed.  History Mayes has a past medical history of Glaucoma; pulmonary embolus; Myocardial infarction; and Past heart attack.   He has a past surgical history that includes  Shoulder surgery.   His family history includes Asthma in his maternal grandfather; Cancer in his paternal uncle and paternal uncle; Heart disease (age of onset: 61) in his father; Hypertension in his mother.He reports that he has never smoked. He has never used smokeless tobacco. He reports that he does not drink alcohol or use drugs.  No results found for this or any previous visit.  [facility] Select 13  Date Value Ref Range Status  08/26/2016 FINAL  Final    Comment:    ==================================================================== [facility] SELECT 13 (MW) ==================================================================== Test                             Result       Flag       Units Drug Absent but Declared for Prescription Verification   Hydrocodone                    Not Detected UNEXPECTED ng/mg creat ==================================================================== Test                      Result    Flag   Units      Ref Range   Creatinine              156              mg/dL      >=57 ==================================================================== Declared Medications:  The flagging and interpretation on this report  are based on the  following declared medications.  Unexpected results may arise from  inaccuracies in the declared medications.  **Note: The testing scope of this panel includes these medications:  Hydrocodone (Vicodin)  **Note: The testing scope of this panel does not include following  reported medications:  Acetaminophen (Vicodin)  Brinzolamide  Rivaroxaban (Xarelto)  Timolol  Travoprost (Travatan) ==================================================================== For clinical consultation, please call [phone]. ====================================================================     Outpatient Medications Prior to Visit  Medication Sig Dispense Refill  . Brinzolamide-Brimonidine (SIMBRINZA) 1-0.2 % SUSP Apply 1 drop to eye  daily.    Marland Kitchen gabapentin (NEURONTIN) 300 MG capsule Take 300 mg by mouth.    Marland Kitchen HYDROcodone-acetaminophen (NORCO/VICODIN) 5-325 MG tablet Take 1 tablet by mouth 3 (three) times daily as needed for moderate pain.    . rivaroxaban (XARELTO) 20 MG TABS tablet Take 20 mg by mouth daily with supper.    . timolol (BETIMOL) 0.25 % ophthalmic solution Apply 1 drop to eye at bedtime.    . TRAVATAN Z 0.004 % SOLN ophthalmic solution     . HYDROcodone-acetaminophen (NORCO/VICODIN) 5-325 MG tablet Take by mouth.    Marland Kitchen HYDROcodone-acetaminophen (VICODIN) 5-500 MG per tablet Take 1 tablet by mouth every 8 (eight) hours as needed for pain. 15 tablet 0  . Brinzolamide-Brimonidine (SIMBRINZA) 1-0.2 % SUSP INSTILL 1 DROP IN BOTH EYES BID    . rivaroxaban (XARELTO) 20 MG TABS tablet Take 20 mg by mouth.    . timolol (TIMOPTIC) 0.25 % ophthalmic solution Apply to eye.    . timolol (TIMOPTIC) 0.5 % ophthalmic solution     . travoprost, benzalkonium, (TRAVATAN) 0.004 % ophthalmic solution Place 1 drop into both eyes at bedtime.    . travoprost, benzalkonium, (TRAVATAN) 0.004 % ophthalmic solution Apply to eye.    . Vitamin D, Ergocalciferol, (DRISDOL) 50000 units CAPS capsule Take by mouth.     No facility-administered medications prior to visit.    Lab Results  Component Value Date   WBC 9.2 07/20/2013   HGB 15.1 07/20/2013   HCT 44.2 07/20/2013   PLT 169 07/20/2013   GLUCOSE 100 (H) 07/20/2013   CHOL 132 07/20/2013   TRIG 65 07/20/2013   HDL 39 (L) 07/20/2013   LDLCALC 80 07/20/2013   ALT 21 07/20/2013   AST 19 07/20/2013   NA 137 07/20/2013   K 4.5 07/20/2013   CL 105 07/20/2013   CREATININE 1.18 07/21/2013   BUN 16 07/20/2013   CO2 27 07/20/2013   INR 1.0 07/20/2013    --------------------------------------------------------------------------------------------------------------------- Mr Cervical Spine Wo Contrast  Result Date: 08/12/2016 CLINICAL DATA:  Left arm pain EXAM: MRI CERVICAL SPINE  WITHOUT CONTRAST TECHNIQUE: Multiplanar, multisequence MR imaging of the cervical spine was performed. No intravenous contrast was administered. COMPARISON:  Cervical MRI 06/15/2014 FINDINGS: Alignment: Mild retrolisthesis C3-4.  Remaining alignment normal. Vertebrae: Negative for fracture or mass.  Normal bone marrow. Cord: Normal signal and morphology. Posterior Fossa, vertebral arteries, paraspinal tissues: Negative Disc levels: C2-3:  Small central disc protrusion without stenosis. C3-4: Moderate disc degeneration with disc bulging and diffuse uncinate spurring. This is causing moderate foraminal encroachment bilaterally and mild spinal stenosis. No change from the prior MRI. C4-5:  Mild disc degeneration without stenosis C5-6: Disc degeneration with diffuse uncinate spurring. Mild spinal stenosis and mild foraminal stenosis bilaterally. No change from the prior study. C6-7: Disc degeneration and diffuse uncinate spurring. Mild spinal stenosis and mild foraminal stenosis bilaterally. C7-T1:  Negative IMPRESSION: Cervical spondylosis at multiple  levels causing spinal and foraminal stenosis. No significant change from the prior MRI. Electronically Signed   By: Marlan Palau M.D.   On: 08/12/2016 15:05       ---------------------------------------------------------------------------------------------------------------------- Past Medical History:  Diagnosis Date  . Glaucoma   . Hx of pulmonary embolus   . Myocardial infarction   . Past heart attack     Past Surgical History:  Procedure Laterality Date  . SHOULDER SURGERY     left shoulder     Family History  Problem Relation Age of Onset  . Heart disease Father 45  . Hypertension Mother   . Asthma Maternal Grandfather   . Cancer Paternal Uncle     lung  . Cancer Paternal Uncle     Social History  Substance Use Topics  . Smoking status: Never Smoker  . Smokeless tobacco: Never Used  . Alcohol use No     ---------------------------------------------------------------------------------------------------------------------- Social History   Social History  . Marital status: Married    Spouse name: N/A  . Number of children: N/A  . Years of education: N/A   Social History Main Topics  . Smoking status: Never Smoker  . Smokeless tobacco: Never Used  . Alcohol use No  . Drug use: No  . Sexual activity: Not Asked   Other Topics Concern  . None   Social History Narrative  . None    Scheduled Meds: Continuous Infusions: PRN Meds:.   BP 126/83   Pulse 77   Temp 98 F (36.7 C) (Oral)   Resp 18   Ht 6\' 1"  (1.854 m)   Wt 285 lb (129.3 kg)   SpO2 99%   BMI 37.60 kg/m    BP Readings from Last 3 Encounters:  10/30/16 126/83  10/01/16 135/86  08/26/16 136/87     Wt Readings from Last 3 Encounters:  10/30/16 285 lb (129.3 kg)  10/01/16 290 lb (131.5 kg)  08/26/16 291 lb (132 kg)     ----------------------------------------------------------------------------------------------------------------------  ROS Review of Systems  Cardiac: No angina GI: No constipation Objective:  BP 126/83   Pulse 77   Temp 98 F (36.7 C) (Oral)   Resp 18   Ht 6\' 1"  (1.854 m)   Wt 285 lb (129.3 kg)   SpO2 99%   BMI 37.60 kg/m   Physical Exam Patient is alert cooperative oriented. He is a good historian. Pupils are equally round reactive to light extraocular muscles intact  heart is regular rate and rhythm without murmur Lungs are clear to Auscultation He has good range of motion at the atlantooccipital joint No change in upper extremity strength however he does have tenderness over the left lateral deltoid similar to before Assessment & Plan:      ----------------------------------------------------------------------------------------------------------------------  Problem List Items Addressed This Visit    None    Visit Diagnoses    Cervicalgia    -  Primary    Relevant Medications   ropivacaine (PF) 2 mg/mL (0.2%) (NAROPIN) injection 10 mL   dexamethasone (DECADRON) injection 10 mg   DDD (degenerative disc disease), cervical       Relevant Medications   dexamethasone (DECADRON) injection 10 mg   dexamethasone (DECADRON) 4 MG/ML injection (Completed)   Other Relevant Orders   Cervical Epidural Injection   Muscle pain, myofacial       Relevant Medications   ropivacaine (PF) 2 mg/mL (0.2%) (NAROPIN) injection 10 mL   dexamethasone (DECADRON) injection 10 mg   Other Relevant Orders   TRIGGER POINT  INJECTION   Ambulatory referral to Orthopedic Surgery   Cervical radiculitis       Relevant Orders   Cervical Epidural Injection      ----------------------------------------------------------------------------------------------------------------------  1. DDD (degenerative disc disease), cervical I want him to continue with physical therapy exercises as reviewed with him in detail today.  2. Cervical radiculitis: I'll have him return to clinic in 1-2 months for reevaluation. We may proceed with a cervical epidural steroid injection for the neck pain and left C7 C8 radiculitis. He will need to come off of his as Xarelto 4 days in advance of this. This will need to be cleared by his primary care physicians. He will contact us 1 week prior to his next appointment to confirm his desire to proceed with cervical epidural. We've gone over the risks and benefits of this procedure with him.   3. Cervicalgia We will proceed with a Second trigger point injection today. The risks and benefits been reviewed and all questions answered. Continue physical therapy with return to clinic in 1-2 months for reevaluation and possible repeat injection 4. Rotator cuff impingement. I'm going to refer him to orthopedics for evaluation and possible rotator cuff  injection ----------------------------------------------------------------------------------------------------------------------  I am having [name] maintain his rivaroxaban, timolol, Brinzolamide-Brimonidine, travoprost (benzalkonium), HYDROcodone-acetaminophen, Vitamin D (Ergocalciferol), gabapentin, timolol, timolol, TRAVATAN Z, rivaroxaban, Brinzolamide-Brimonidine, and travoprost (benzalkonium). We administered ropivacaine (PF) 2 mg/mL (0.2%) and dexamethasone. We will continue to administer ropivacaine (PF) 2 mg/mL (0.2%) and dexamethasone.   Meds ordered this encounter  Medications  . ropivacaine (PF) 2 mg/mL (0.2%) (NAROPIN) injection 10 mL  . dexamethasone (DECADRON) injection 10 mg  . ropivacaine (PF) 2 mg/mL (0.2%) (NAROPIN) 2 MG/ML injection    Vivia EwingWheatley, Dena: cabinet override  . dexamethasone (DECADRON) 4 MG/ML injection    Vivia EwingWheatley, Dena: cabinet override    Trigger point to the left lateral deltoid. Trigger point injection: The area overlying the aforementioned trigger point was prepped with alcohol. It was then injected with a 25-gauge needle with 8 cc of ropivacaine 0.2% and Decadron 8 mg at each site after negative aspiration for heme. This was performed after informed consent was obtained and risks and benefits reviewed. She tolerated this procedure without difficulty and was convalesced and discharged to home in stable condition for follow-up as mentioned.  @Anderson Middlebrooks, MD@    Follow-up: Return in about 2 months (around 12/28/2016) for procedure.    Yevette EdwardsJames G Natayla Cadenhead, MD  This dictation was performed utilizing Dragon voice recognition software.  Please excuse any unintentional or mistaken typographical errors as a result of its unedited utilization.

## 2016-10-31 NOTE — Telephone Encounter (Signed)
Post procedure phone call.  Patient states he is doing good.  

## 2016-12-26 ENCOUNTER — Ambulatory Visit: Payer: Medicare HMO | Admitting: Anesthesiology

## 2017-01-15 ENCOUNTER — Ambulatory Visit: Payer: Medicare HMO | Admitting: Anesthesiology

## 2017-03-24 ENCOUNTER — Emergency Department: Payer: Medicare PPO

## 2017-03-24 ENCOUNTER — Observation Stay (HOSPITAL_BASED_OUTPATIENT_CLINIC_OR_DEPARTMENT_OTHER)
Admit: 2017-03-24 | Discharge: 2017-03-24 | Disposition: A | Discharge status: Home / Self Care | Payer: Medicare PPO | Attending: Internal Medicine | Admitting: Internal Medicine

## 2017-03-24 ENCOUNTER — Inpatient Hospital Stay
Admission: EM | Admit: 2017-03-24 | Discharge: 2017-03-27 | DRG: 175 | Disposition: A | Discharge status: Home / Self Care | Payer: Medicare PPO | Attending: Internal Medicine | Admitting: Internal Medicine

## 2017-03-24 ENCOUNTER — Encounter: Payer: Self-pay | Admitting: Emergency Medicine

## 2017-03-24 DIAGNOSIS — H409 Unspecified glaucoma: Secondary | ICD-10-CM | POA: Diagnosis present

## 2017-03-24 DIAGNOSIS — O2230 Deep phlebothrombosis in pregnancy, unspecified trimester: Secondary | ICD-10-CM

## 2017-03-24 DIAGNOSIS — M79604 Pain in right leg: Secondary | ICD-10-CM

## 2017-03-24 DIAGNOSIS — I82442 Acute embolism and thrombosis of left tibial vein: Secondary | ICD-10-CM | POA: Diagnosis present

## 2017-03-24 DIAGNOSIS — I252 Old myocardial infarction: Secondary | ICD-10-CM

## 2017-03-24 DIAGNOSIS — I2699 Other pulmonary embolism without acute cor pulmonale: Principal | ICD-10-CM

## 2017-03-24 DIAGNOSIS — Z86718 Personal history of other venous thrombosis and embolism: Secondary | ICD-10-CM

## 2017-03-24 DIAGNOSIS — I2609 Other pulmonary embolism with acute cor pulmonale: Secondary | ICD-10-CM

## 2017-03-24 DIAGNOSIS — R0781 Pleurodynia: Secondary | ICD-10-CM | POA: Diagnosis not present

## 2017-03-24 DIAGNOSIS — Z8249 Family history of ischemic heart disease and other diseases of the circulatory system: Secondary | ICD-10-CM

## 2017-03-24 DIAGNOSIS — M79605 Pain in left leg: Secondary | ICD-10-CM

## 2017-03-24 DIAGNOSIS — Z86711 Personal history of pulmonary embolism: Secondary | ICD-10-CM

## 2017-03-24 DIAGNOSIS — Z79899 Other long term (current) drug therapy: Secondary | ICD-10-CM

## 2017-03-24 DIAGNOSIS — R0602 Shortness of breath: Secondary | ICD-10-CM | POA: Diagnosis not present

## 2017-03-24 DIAGNOSIS — N179 Acute kidney failure, unspecified: Secondary | ICD-10-CM | POA: Diagnosis present

## 2017-03-24 DIAGNOSIS — J189 Pneumonia, unspecified organism: Secondary | ICD-10-CM | POA: Diagnosis present

## 2017-03-24 DIAGNOSIS — M7120 Synovial cyst of popliteal space [Baker], unspecified knee: Secondary | ICD-10-CM | POA: Diagnosis present

## 2017-03-24 DIAGNOSIS — I2510 Atherosclerotic heart disease of native coronary artery without angina pectoris: Secondary | ICD-10-CM | POA: Diagnosis present

## 2017-03-24 DIAGNOSIS — M7989 Other specified soft tissue disorders: Secondary | ICD-10-CM

## 2017-03-24 LAB — COMPREHENSIVE METABOLIC PANEL
ALT: 17 U/L (ref 17–63)
AST: 17 U/L (ref 15–41)
Albumin: 3.7 g/dL (ref 3.5–5.0)
Alkaline Phosphatase: 59 U/L (ref 38–126)
Anion gap: 7 (ref 5–15)
BILIRUBIN TOTAL: 1.5 mg/dL — AB (ref 0.3–1.2)
BUN: 15 mg/dL (ref 6–20)
CHLORIDE: 102 mmol/L (ref 101–111)
CO2: 26 mmol/L (ref 22–32)
Calcium: 8.9 mg/dL (ref 8.9–10.3)
Creatinine, Ser: 1.15 mg/dL (ref 0.61–1.24)
Glucose, Bld: 122 mg/dL — ABNORMAL HIGH (ref 65–99)
POTASSIUM: 3.9 mmol/L (ref 3.5–5.1)
Sodium: 135 mmol/L (ref 135–145)
Total Protein: 7.4 g/dL (ref 6.5–8.1)

## 2017-03-24 LAB — URINALYSIS, COMPLETE (UACMP) WITH MICROSCOPIC
Bacteria, UA: NONE SEEN
Bilirubin Urine: NEGATIVE
GLUCOSE, UA: NEGATIVE mg/dL
Hgb urine dipstick: NEGATIVE
Ketones, ur: NEGATIVE mg/dL
Leukocytes, UA: NEGATIVE
NITRITE: NEGATIVE
PH: 5 (ref 5.0–8.0)
PROTEIN: NEGATIVE mg/dL
Specific Gravity, Urine: 1.04 — ABNORMAL HIGH (ref 1.005–1.030)
Squamous Epithelial / HPF: NONE SEEN

## 2017-03-24 LAB — CBC WITH DIFFERENTIAL/PLATELET
Basophils Absolute: 0.1 10*3/uL (ref 0–0.1)
Basophils Relative: 1 %
EOS PCT: 2 %
Eosinophils Absolute: 0.2 10*3/uL (ref 0–0.7)
HEMATOCRIT: 42.1 % (ref 40.0–52.0)
Hemoglobin: 14.3 g/dL (ref 13.0–18.0)
LYMPHS ABS: 1.3 10*3/uL (ref 1.0–3.6)
LYMPHS PCT: 13 %
MCH: 32.5 pg (ref 26.0–34.0)
MCHC: 34 g/dL (ref 32.0–36.0)
MCV: 95.7 fL (ref 80.0–100.0)
MONO ABS: 0.8 10*3/uL (ref 0.2–1.0)
MONOS PCT: 8 %
NEUTROS ABS: 7.5 10*3/uL — AB (ref 1.4–6.5)
Neutrophils Relative %: 76 %
PLATELETS: 171 10*3/uL (ref 150–440)
RBC: 4.4 MIL/uL (ref 4.40–5.90)
RDW: 13.5 % (ref 11.5–14.5)
WBC: 9.8 10*3/uL (ref 3.8–10.6)

## 2017-03-24 LAB — HEPARIN LEVEL (UNFRACTIONATED): HEPARIN UNFRACTIONATED: 0.55 [IU]/mL (ref 0.30–0.70)

## 2017-03-24 LAB — ANTITHROMBIN III: AntiThromb III Func: 80 % (ref 75–120)

## 2017-03-24 LAB — PROTIME-INR
INR: 1.11
PROTHROMBIN TIME: 14.3 s (ref 11.4–15.2)

## 2017-03-24 LAB — APTT: aPTT: 43 seconds — ABNORMAL HIGH (ref 24–36)

## 2017-03-24 LAB — LIPASE, BLOOD: LIPASE: 23 U/L (ref 11–51)

## 2017-03-24 LAB — TROPONIN I

## 2017-03-24 MED ORDER — HYDROCODONE-ACETAMINOPHEN 5-325 MG PO TABS: 1.0000 | ORAL_TABLET | Freq: Three times a day (TID) | ORAL | Status: DC | PRN

## 2017-03-24 MED ORDER — HEPARIN (PORCINE) IN NACL 100-0.45 UNIT/ML-% IJ SOLN
2000.0000 [IU]/h | INTRAMUSCULAR | Status: AC
  Administered 2017-03-24 (×2): 1750 [IU]/h via INTRAVENOUS
  Administered 2017-03-25 – 2017-03-26 (×2): 1850 [IU]/h via INTRAVENOUS
  Filled 2017-03-24 (×8): qty 250

## 2017-03-24 MED ORDER — SODIUM CHLORIDE 0.9 % IV BOLUS (SEPSIS)
500.0000 mL | Freq: Once | INTRAVENOUS | Status: AC
  Administered 2017-03-24: 500 mL via INTRAVENOUS

## 2017-03-24 MED ORDER — DOCUSATE SODIUM 100 MG PO CAPS: 100.0000 mg | ORAL_CAPSULE | Freq: Two times a day (BID) | ORAL | Status: DC | PRN

## 2017-03-24 MED ORDER — HEPARIN BOLUS VIA INFUSION
5000.0000 [IU] | Freq: Once | INTRAVENOUS | Status: AC
  Administered 2017-03-24: 5000 [IU] via INTRAVENOUS
  Filled 2017-03-24: qty 5000

## 2017-03-24 MED ORDER — PERFLUTREN LIPID MICROSPHERE
1.0000 mL | INTRAVENOUS | Status: AC | PRN
  Administered 2017-03-24: 2 mL via INTRAVENOUS
  Filled 2017-03-24: qty 10

## 2017-03-24 MED ORDER — IOPAMIDOL (ISOVUE-370) INJECTION 76%
100.0000 mL | Freq: Once | INTRAVENOUS | Status: AC | PRN
  Administered 2017-03-24: 100 mL via INTRAVENOUS

## 2017-03-24 MED ORDER — KETOROLAC TROMETHAMINE 30 MG/ML IJ SOLN
30.0000 mg | Freq: Four times a day (QID) | INTRAMUSCULAR | Status: DC | PRN
  Administered 2017-03-25 (×2): 30 mg via INTRAVENOUS
  Filled 2017-03-24 (×2): qty 1

## 2017-03-24 MED ORDER — MORPHINE SULFATE (PF) 4 MG/ML IV SOLN
4.0000 mg | Freq: Once | INTRAVENOUS | Status: AC
  Administered 2017-03-24: 4 mg via INTRAVENOUS
  Filled 2017-03-24: qty 1

## 2017-03-24 MED ORDER — GABAPENTIN 300 MG PO CAPS
300.0000 mg | ORAL_CAPSULE | Freq: Two times a day (BID) | ORAL | Status: DC
  Administered 2017-03-24 – 2017-03-27 (×7): 300 mg via ORAL
  Filled 2017-03-24 (×7): qty 1

## 2017-03-24 MED ORDER — TIMOLOL MALEATE 0.25 % OP SOLN
1.0000 [drp] | Freq: Two times a day (BID) | OPHTHALMIC | Status: DC
  Administered 2017-03-24 – 2017-03-27 (×7): 1 [drp] via OPHTHALMIC
  Filled 2017-03-24: qty 5

## 2017-03-24 MED ORDER — TRAVOPROST (BAK FREE) 0.004 % OP SOLN
1.0000 [drp] | Freq: Every day | OPHTHALMIC | Status: DC
  Administered 2017-03-24 – 2017-03-26 (×3): 1 [drp] via OPHTHALMIC
  Filled 2017-03-24: qty 2.5

## 2017-03-24 MED ORDER — ONDANSETRON HCL 4 MG/2ML IJ SOLN
4.0000 mg | Freq: Once | INTRAMUSCULAR | Status: AC
  Administered 2017-03-24: 4 mg via INTRAVENOUS
  Filled 2017-03-24: qty 2

## 2017-03-24 MED ORDER — KETOROLAC TROMETHAMINE 30 MG/ML IJ SOLN
30.0000 mg | Freq: Once | INTRAMUSCULAR | Status: AC
  Administered 2017-03-24: 30 mg via INTRAVENOUS
  Filled 2017-03-24: qty 1

## 2017-03-24 NOTE — Consult Note (Signed)
PULMONARY CONSULT NOTE  Requesting MD/Service: Elisabeth PigeonVachhani Date of initial consultation: 03/24/17 Reason for consultation: PE  PT PROFILE: 66 y.o. male with prior history of coronary artery disease and pulmonary embolism admitted with acute PE  HPI:  As above. Patient was admitted with 2-3 days of increasing shortness of breath and 1-2 days of severe pleuritic chest pain. He has a prior history of pulmonary embolism in 2014 and was on systemic anticoagulation for 2 years. The cause of that prior episode was never discerned. He has no known coagulation risk factors.  Past Medical History:  Diagnosis Date  . Glaucoma   . Hx of pulmonary embolus   . Myocardial infarction (HCC)   . Past heart attack     Past Surgical History:  Procedure Laterality Date  . SHOULDER SURGERY     left shoulder     MEDICATIONS: I have reviewed all medications and confirmed regimen as documented  Social History   Social History  . Marital status: Married    Spouse name: N/A  . Number of children: N/A  . Years of education: N/A   Occupational History  . Not on file.   Social History Main Topics  . Smoking status: Never Smoker  . Smokeless tobacco: Never Used  . Alcohol use No  . Drug use: No  . Sexual activity: Not on file   Other Topics Concern  . Not on file   Social History Narrative  . No narrative on file    Family History  Problem Relation Age of Onset  . Heart disease Father 2061  . Hypertension Mother   . Asthma Maternal Grandfather   . Cancer Paternal Uncle        lung  . Cancer Paternal Uncle     ROS: No fever, myalgias/arthralgias, unexplained weight loss or weight gain No new focal weakness or sensory deficits No otalgia, hearing loss, visual changes, nasal and sinus symptoms, mouth and throat problems No neck pain or adenopathy No abdominal pain, N/V/D, diarrhea, change in bowel pattern No dysuria, change in urinary pattern   Vitals:   03/24/17 0944 03/24/17 1214  03/24/17 1312 03/24/17 1343  BP: (!) 142/92 (!) 130/91 122/75   Pulse: 73 70 67   Resp: 20 20 18    Temp:   97.6 F (36.4 C)   TempSrc:   Oral   SpO2: 96% 95% 95%   Weight:    291 lb (132 kg)  Height:    6\' 1"  (1.854 m)     EXAM:   Gen: WDWN in no respiratory distress HEENT: NCAT, sclerae white, oropharynx normal Neck: NO LAN, no JVD noted Lungs: Splinting, breath sounds diminished, no adventitious sounds Cardiovascular: Reg rate, normal rhythm, no M noted Abdomen: Moderately obese, Soft, NT, +BS Ext: Left greater than right ankle edema Neuro: PERRL, EOMI, motor/sensory grossly intact Skin: No lesions noted   DATA:   BMP Latest Ref Rng & Units 03/24/2017 07/21/2013 07/20/2013  Glucose 65 - 99 mg/dL 161(W122(H) - 960(A100(H)  BUN 6 - 20 mg/dL 15 - 16  Creatinine 5.400.61 - 1.24 mg/dL 9.811.15 1.911.18 4.78(G1.31(H)  Sodium 135 - 145 mmol/L 135 - 137  Potassium 3.5 - 5.1 mmol/L 3.9 - 4.5  Chloride 101 - 111 mmol/L 102 - 105  CO2 22 - 32 mmol/L 26 - 27  Calcium 8.9 - 10.3 mg/dL 8.9 - 9.3    CBC Latest Ref Rng & Units 03/24/2017 07/20/2013  WBC 3.8 - 10.6 K/uL 9.8 9.2  Hemoglobin 13.0 -  18.0 g/dL 16.1 09.6  Hematocrit 04.5 - 52.0 % 42.1 44.2  Platelets 150 - 440 K/uL 171 169    CT chest:  Bilateral pulmonary emboli with mild RV dilatation  IMPRESSION:   Recurrent pulmonary embolism - no definite risk factors Pleuritic pain   PLAN:  Hypercoagulability panel has been ordered As this is his second event, he will need lifelong anticoagulation. He did well with NOAC previously Ketorolac as needed for pleuritic pain  PCCM will sign off. Please call if we can be of further assistance   Billy Fischer, MD PCCM service Mobile [phone] Pager [phone] 03/24/2017 6:08 PM

## 2017-03-24 NOTE — Progress Notes (Signed)
ANTICOAGULATION CONSULT NOTE -Follow Up Consult  Pharmacy Consult for Heparin Indication: pulmonary embolus  No Known Allergies  Patient Measurements: Height: 6\' 1"  (185.4 cm) Weight: 291 lb (132 kg) IBW/kg (Calculated) : 79.9 Heparin Dosing Weight: 108.7 kg  Vital Signs: Temp: 98.3 F (36.8 C) (06/04 2004) Temp Source: Oral (06/04 2004) BP: 167/88 (06/04 2004) Pulse Rate: 75 (06/04 2004)  Labs:  Recent Labs  03/24/17 0855 03/24/17 1049 03/24/17 1952  HGB 14.3  --   --   HCT 42.1  --   --   PLT 171  --   --   APTT  --  43*  --   LABPROT  --  14.3  --   INR  --  1.11  --   HEPARINUNFRC  --   --  0.55  CREATININE 1.15  --   --   TROPONINI <0.03  --   --     Estimated Creatinine Clearance: 92.4 mL/min (by C-G formula based on SCr of 1.15 mg/dL).   Medical History: Past Medical History:  Diagnosis Date  . Glaucoma   . Hx of pulmonary embolus   . Myocardial infarction (HCC)   . Past heart attack      Assessment: 66 yo male with hx of PE currently off anticoagulation with new PE per CTA. Called RN who was at pt's bedside. PTA med list (not current) lists Xarelto; per pt via RN pt stopped Xarelto about 8 months ago, not on any anticoagulants currently or in last 6-8 months. Upon order entry got an alert for possible epidural order and anticoagulant. Per RN pt's history does not mention any epidural/spinal injections and pt without epidural presently. Spoke with pt via phone as well; pt states he has never received an epidural or any spinal injections for pain or numbing via the spine for any procedures. Injections in January were into his knee.    Goal of Therapy:  Heparin level 0.3-0.7 units/ml Monitor platelets by anticoagulation protocol: Yes   Plan:  Stat aPTT and INR Heparin bolus 5000 units IV x1 then 1750 units/hr (=17.5 ml/hr) Heparin level in 6h after start of infusion CBC in AM    03/24/17 ~ 20:00 HL = 0.55. Will continue current drip rate. Will  recheck level in 6 hours on 6/5 at 02:00.    Pharmacy will continue to follow.   Shelbee Apgar K, RPH 03/24/2017,8:30 PM

## 2017-03-24 NOTE — H&P (Signed)
[employer] [facility]   PATIENT NAME: Jeff Leonard    MR#:  [id number]  DATE OF BIRTH:  05-13-1951  DATE OF ADMISSION:  03/24/2017  PRIMARY CARE PHYSICIAN: Sherrie Mustache   REQUESTING/REFERRING PHYSICIAN: Schaevitz  CHIEF COMPLAINT:   Chief Complaint  Patient presents with  . Shortness of Breath    HISTORY OF PRESENT ILLNESS: Jeff Leonard  is a 66 y.o. male with a known history of Pulmonary emboli myocardial infarction, was on Xarelto from 2014 to 2016, then stopped. Since yesterday started having pain in his chest and back and feeling short of breath. It continued to get worse to the point that today he could not lie down flat and feels very short of breath so decided to come to emergency room. In ER he was not hypoxic but on CT scan of the chest he was noted to have multifocal pulmonary emboli with right heart strain so ER physician spoke to pulmonologist he suggested to start on heparin IV for now and keep in hospital for observation.  PAST MEDICAL HISTORY:   Past Medical History:  Diagnosis Date  . Glaucoma   . Hx of pulmonary embolus   . Myocardial infarction (HCC)   . Past heart attack     PAST SURGICAL HISTORY: Past Surgical History:  Procedure Laterality Date  . SHOULDER SURGERY     left shoulder     SOCIAL HISTORY:  Social History  Substance Use Topics  . Smoking status: Never Smoker  . Smokeless tobacco: Never Used  . Alcohol use No    FAMILY HISTORY:  Family History  Problem Relation Age of Onset  . Heart disease Father 16  . Hypertension Mother   . Asthma Maternal Grandfather   . Cancer Paternal Uncle        lung  . Cancer Paternal Uncle     DRUG ALLERGIES: No Known Allergies  REVIEW OF SYSTEMS:   CONSTITUTIONAL: No fever, fatigue or weakness.  EYES: No blurred or double vision.  EARS, NOSE, AND THROAT: No tinnitus or ear pain.  RESPIRATORY: No cough,Positive for shortness of breath, no wheezing or hemoptysis.   CARDIOVASCULAR: Positive for chest pain, no orthopnea, edema.  GASTROINTESTINAL: No nausea, vomiting, diarrhea or abdominal pain.  GENITOURINARY: No dysuria, hematuria.  ENDOCRINE: No polyuria, nocturia,  HEMATOLOGY: No anemia, easy bruising or bleeding SKIN: No rash or lesion. MUSCULOSKELETAL: No joint pain or arthritis.   NEUROLOGIC: No tingling, numbness, weakness.  PSYCHIATRY: No anxiety or depression.   MEDICATIONS AT HOME:  Prior to Admission medications   Medication Sig Start Date End Date Taking? Authorizing Provider  Brinzolamide-Brimonidine [facility]) 1-0.2 % SUSP INSTILL 1 DROP IN BOTH EYES BID 07/13/15  Yes [provider]  timolol (TIMOPTIC) 0.25 % ophthalmic solution Apply to eye.   Yes [provider]  travoprost, benzalkonium, (TRAVATAN) 0.004 % ophthalmic solution Place 1 drop into both eyes at bedtime.   Yes [provider]  gabapentin (NEURONTIN) 300 MG capsule Take 300 mg by mouth. 12/21/14   [provider]  HYDROcodone-acetaminophen (NORCO/VICODIN) 5-325 MG tablet Take 1 tablet by mouth 3 (three) times daily as needed for moderate pain.    [provider]  Vitamin D, Ergocalciferol, (DRISDOL) 50000 units CAPS capsule Take by mouth.    [provider]      PHYSICAL EXAMINATION:   VITAL SIGNS: Blood pressure (!) 142/92, pulse 73, temperature 98.7 F (37.1 C), temperature source Oral, resp. rate 20, weight 129.3 kg (  285 lb), SpO2 96 %.  GENERAL:  66 y.o.-year-old patient lying in the bed with no acute distress.  EYES: Pupils equal, round, reactive to light and accommodation. No scleral icterus. Extraocular muscles intact.  HEENT: Head atraumatic, normocephalic. Oropharynx and nasopharynx clear.  NECK:  Supple, no jugular venous distention. No thyroid enlargement, no tenderness.  LUNGS: Normal breath sounds bilaterally, no wheezing, rales,rhonchi or crepitation. No use of accessory muscles of respiration.   CARDIOVASCULAR: S1, S2 normal. No murmurs, rubs, or gallops.  ABDOMEN: Soft, nontender, nondistended. Bowel sounds present. No organomegaly or mass.  EXTREMITIES: No pedal edema, cyanosis, or clubbing.  NEUROLOGIC: Cranial nerves II through XII are intact. Muscle strength 5/5 in all extremities. Sensation intact. Gait not checked.  PSYCHIATRIC: The patient is alert and oriented x 3.  SKIN: No obvious rash, lesion, or ulcer.   LABORATORY PANEL:   CBC  Recent Labs Lab 03/24/17 0855  WBC 9.8  HGB 14.3  HCT 42.1  PLT 171  MCV 95.7  MCH 32.5  MCHC 34.0  RDW 13.5  LYMPHSABS 1.3  MONOABS 0.8  EOSABS 0.2  BASOSABS 0.1   ------------------------------------------------------------------------------------------------------------------  Chemistries   Recent Labs Lab 03/24/17 0855  NA 135  K 3.9  CL 102  CO2 26  GLUCOSE 122*  BUN 15  CREATININE 1.15  CALCIUM 8.9  AST 17  ALT 17  ALKPHOS 59  BILITOT 1.5*   ------------------------------------------------------------------------------------------------------------------ estimated creatinine clearance is 91.5 mL/min (by C-G formula based on SCr of 1.15 mg/dL). ------------------------------------------------------------------------------------------------------------------ No results for input(s): TSH, T4TOTAL, T3FREE, THYROIDAB in the last 72 hours.  Invalid input(s): FREET3   Coagulation profile  Recent Labs Lab 03/24/17 1049  INR 1.11   ------------------------------------------------------------------------------------------------------------------- No results for input(s): DDIMER in the last 72 hours. -------------------------------------------------------------------------------------------------------------------  Cardiac Enzymes  Recent Labs Lab 03/24/17 0855  TROPONINI <0.03   ------------------------------------------------------------------------------------------------------------------ Invalid  input(s): POCBNP  ---------------------------------------------------------------------------------------------------------------  Urinalysis No results found for: COLORURINE, APPEARANCEUR, LABSPEC, PHURINE, GLUCOSEU, HGBUR, BILIRUBINUR, KETONESUR, PROTEINUR, UROBILINOGEN, NITRITE, LEUKOCYTESUR   RADIOLOGY: Ct Angio Chest Pe W Or Wo Contrast  Result Date: 03/24/2017 CLINICAL DATA:  Shortness of Breath EXAM: CT ANGIOGRAPHY CHEST WITH CONTRAST TECHNIQUE: Multidetector CT imaging of the chest was performed using the standard protocol during bolus administration of intravenous contrast. Multiplanar CT image reconstructions and MIPs were obtained to evaluate the vascular anatomy. CONTRAST:  100 mL Isovue 370 nonionic COMPARISON:  Chest radiograph July 20, 2013. FINDINGS: Cardiovascular: There is extensive pulmonary embolism arising from from the distal left main pulmonary artery extending into multiple upper and lower lobe branches on the left. On the right, there are multiple pulmonary emboli and upper and lower lobe pulmonary arteries which arises distal to the main pulmonary outflow tract. The right ventricle to left ventricle diameter ratio is 1.0, concerning for right heart strain. There is no appreciable thoracic aortic aneurysm or dissection. The visualized great vessels appear unremarkable. Pericardium is not appreciably thickened. Mediastinum/Nodes: Visualized thyroid appears unremarkable. There are scattered subcentimeter mediastinal lymph nodes but no frank adenopathy is evident by size criteria. Lungs/Pleura: There is patchy airspace opacity in the lower lobes bilaterally consistent with patchy pneumonia in the bases. There is atelectatic change in the inferior most aspect of the lingula and right middle lobe. There is a small right pleural effusion. Upper Abdomen: There is apparent sludge in the gallbladder. Visualized upper abdominal structures otherwise appear unremarkable. Musculoskeletal:  There is degenerative change in the thoracic spine. There are no blastic or lytic bone lesions. Review of  the MIP images confirms the above findings. IMPRESSION: Extensive multifocal pulmonary embolus bilaterally with evidence suggesting a degree of right heart strain. Positive for acute PE with CT evidence of right heart strain (RV/LV Ratio = 1.0) consistent with at least submassive (intermediate risk) PE. The presence of right heart strain has been associated with an increased risk of morbidity and mortality. Please activate Code PE by paging [phone]. No thoracic aortic aneurysm or dissection. Patchy infiltrate, likely pneumonia, in the bases. Small right pleural effusion. No evident adenopathy. Apparent sludge in gallbladder. Critical Value/emergent results were called by telephone at the time of interpretation on 03/24/2017 at 10:22 am to Dr. Gladstone Pih , who verbally acknowledged these results. Electronically Signed   By: Bretta Bang III M.D.   On: 03/24/2017 10:22    EKG: Orders placed or performed during the hospital encounter of 03/24/17  . EKG 12-Lead  . EKG 12-Lead    IMPRESSION AND PLAN:  * Acute pulmonary emboli   Started on heparin IV drip by ER physician, pulmonary consult. Echocardiogram to check on right heart strain.   Currently patient is feeling chest pain and cannot tie down flat due to that.   Once he feels symptomatically better we may switch to oral anticoagulants   He may need a referral to hematology clinic as this is second episode of pulmonary emboli.  * Glaucoma   Continue eye drops.   All the records are reviewed and case discussed with ED provider. Management plans discussed with the patient, family and they are in agreement.  CODE STATUS: code  Code Status History    This patient does not have a recorded code status. Please follow your organizational policy for patients in this situation.       TOTAL TIME TAKING CARE OF THIS PATIENT: 50  minutes.    Altamese Dilling M.D on 03/24/2017   Between 7am to 6pm - Pager - [phone]  After 6pm go to [email or web address] - password [email or web address]  [facility]  Office  [phone]  CC: Primary care physician; Sherrie Mustache   Note: This dictation was prepared with Dragon dictation along with smaller phrase technology. Any transcriptional errors that result from this process are unintentional.

## 2017-03-24 NOTE — ED Provider Notes (Signed)
[facility] Emergency Department Provider Note  ___________________________________________   First MD Initiated Contact with Patient 03/24/17 0845     (approximate)  I have reviewed the triage vital signs and the nursing notes.   HISTORY  Chief Complaint Shortness of Breath   HPI Jeff Leonard is a 66 y.o. male with a history of pulmonary embolus now off of anticoagulation who is presenting to the emergency department with right sided low thoracic pain as well as shortness of breath since yesterday. He says that he started to notice yesterday that he was having difficulty singing at church. He then last night started to have pain which she describes now as a 10 out of 10 and sharp. He says the pain radiates around from the right lower thoracic back around to his right chest. Says this feels very similar to last time he had a pulmonary embolus. Says that he is now off of anticoagulation after one year. Denies any injury or strenuous activity that could've caused any muscle strain.   Past Medical History:  Diagnosis Date  . Glaucoma   . Hx of pulmonary embolus   . Myocardial infarction (HCC)   . Past heart attack     Patient Active Problem List   Diagnosis Date Noted  . Morbid obesity (HCC) 05/04/2014  . DVT (deep venous thrombosis) (HCC) 05/04/2014  . Shortness of breath 03/21/2014  . Pulmonary embolism 03/21/2014    Past Surgical History:  Procedure Laterality Date  . SHOULDER SURGERY     left shoulder     Prior to Admission medications   Medication Sig Start Date End Date Taking? Authorizing Provider  Brinzolamide-Brimonidine [facility]) 1-0.2 % SUSP INSTILL 1 DROP IN BOTH EYES BID 07/13/15  Yes [provider]  timolol (TIMOPTIC) 0.25 % ophthalmic solution Apply to eye.   Yes [provider]  travoprost, benzalkonium, (TRAVATAN) 0.004 % ophthalmic solution Place 1 drop into both eyes at bedtime.   Yes [provider]   gabapentin (NEURONTIN) 300 MG capsule Take 300 mg by mouth. 12/21/14   [provider]  HYDROcodone-acetaminophen (NORCO/VICODIN) 5-325 MG tablet Take 1 tablet by mouth 3 (three) times daily as needed for moderate pain.    [provider]  Vitamin D, Ergocalciferol, (DRISDOL) 50000 units CAPS capsule Take by mouth.    [provider]    Allergies Patient has no known allergies.  Family History  Problem Relation Age of Onset  . Heart disease Father 24  . Hypertension Mother   . Asthma Maternal Grandfather   . Cancer Paternal Uncle        lung  . Cancer Paternal Uncle     Social History Social History  Substance Use Topics  . Smoking status: Never Smoker  . Smokeless tobacco: Never Used  . Alcohol use No    Review of Systems  Constitutional: No fever/chills Eyes: No visual changes. ENT: No sore throat. Cardiovascular: as above Respiratory: as above Gastrointestinal: No abdominal pain.  No nausea, no vomiting.  No diarrhea.  No constipation. Genitourinary: Negative for dysuria. Musculoskeletal: Negative for back pain. Skin: Negative for rash. Neurological: Negative for headaches, focal weakness or numbness.   ____________________________________________   PHYSICAL EXAM:  VITAL SIGNS: ED Triage Vitals  Enc Vitals Group     BP 03/24/17 0841 138/75     Pulse Rate 03/24/17 0841 79     Resp 03/24/17 0841 20     Temp 03/24/17 0841 98.7 F (37.1 C)  Temp Source 03/24/17 0841 Oral     SpO2 03/24/17 0841 99 %     Weight 03/24/17 0839 285 lb (129.3 kg)     Height --      Head Circumference --      Peak Flow --      Pain Score 03/24/17 0839 10     Pain Loc --      Pain Edu? --      Excl. in GC? --     Constitutional: Alert and oriented. Well appearing and in no acute distress. Eyes: Conjunctivae are normal.  Head: Atraumatic. Nose: No congestion/rhinnorhea. Mouth/Throat: Mucous membranes are moist.  Neck: No stridor.     Cardiovascular: Normal rate, regular rhythm. Grossly normal heart sounds.  Good peripheral circulation With equal and bilateral radial pulses. Respiratory: Normal respiratory effort.  No retractions. Lungs CTAB. Gastrointestinal: Soft and nontender. No distention.  Musculoskeletal: Moderate bilateral lower extremity edema which the patient says his ongoing issue. Tenderness palpation at the left lower thoracic region. No ecchymosis. No rash. Tenderness is moderate. Neurologic:  Normal speech and language. No gross focal neurologic deficits are appreciated. Skin:  Skin is warm, dry and intact. No rash noted. Psychiatric: Mood and affect are normal. Speech and behavior are normal.  ____________________________________________   LABS (all labs ordered are listed, but only abnormal results are displayed)  Labs Reviewed  CBC WITH DIFFERENTIAL/PLATELET - Abnormal; Notable for the following:       Result Value   Neutro Abs 7.5 (*)    All other components within normal limits  COMPREHENSIVE METABOLIC PANEL - Abnormal; Notable for the following:    Glucose, Bld 122 (*)    Total Bilirubin 1.5 (*)    All other components within normal limits  LIPASE, BLOOD  TROPONIN I  URINALYSIS, COMPLETE (UACMP) WITH MICROSCOPIC  APTT  PROTIME-INR   ____________________________________________  EKG  ED ECG REPORT I, Arelia Longest, the attending physician, personally viewed and interpreted this ECG.   Date: 03/24/2017  EKG Time: 0839  Rate: 79  Rhythm: normal sinus rhythm  Axis: left  Intervals:none  ST&T Change: No ST segment elevation or depression. No abnormal T-wave inversion.  ____________________________________________  RADIOLOGY  Extensive multifocal pulmonary embolus bilaterally. ____________________________________________   PROCEDURES  Procedure(s) performed:   Procedures  Critical Care performed:  CRITICAL CARE Performed by: Arelia Longest   Total critical  care time: 35 minutes  Critical care time was exclusive of separately billable procedures and treating other patients.  Critical care was necessary to treat or prevent imminent or life-threatening deterioration.  Critical care was time spent personally by me on the following activities: development of treatment plan with patient and/or surrogate as well as nursing, discussions with consultants, evaluation of patient's response to treatment, examination of patient, obtaining history from patient or surrogate, ordering and performing treatments and interventions, ordering and review of laboratory studies, ordering and review of radiographic studies, pulse oximetry and re-evaluation of patient's condition.  ____________________________________________   INITIAL IMPRESSION / ASSESSMENT AND PLAN / ED COURSE  Pertinent labs & imaging results that were available during my care of the patient were reviewed by me and considered in my medical decision making (see chart for details).    Clinical Course as of Mar 24 1108  Mon Mar 24, 2017  1033 Patient found to have a large clot burden on CAT scan. I discussed the case with Dr. Craige Cotta, critical care [place], who recommends evaluation by critical care  here in [place]. However, after discussion of case and the patient's lab values Dr. Craige CottaSood patient is likely not a candidate for TPA. I also updated patient about his imaging findings and the likely need for admission to the hospital.  [DS]    Clinical Course User Index [DS] Myrna BlazerSchaevitz, Juliette Standre Matthew, MD   ----------------------------------------- 11:10 AM on 03/24/2017 -----------------------------------------  Patient will be admitted to the hospital. After speaking with Dr. Darrol AngelSimons of critical care here in [place] he does not recommend TPA. He recommends a heparin drip and admission to the hospital. I updated the patient has a plan in the diagnosis. The patient is understanding of blood to comply.  Signed out to Dr. Elisabeth PigeonVachhani.  ____________________________________________   FINAL CLINICAL IMPRESSION(S) / ED DIAGNOSES  Final diagnoses:  Shortness of breath  Other acute pulmonary embolism without acute cor pulmonale (HCC)      NEW MEDICATIONS STARTED DURING THIS VISIT:  New Prescriptions   No medications on file     Note:  This document was prepared using Dragon voice recognition software and may include unintentional dictation errors.     Myrna BlazerSchaevitz, Adonis Ryther Matthew, MD 03/24/17 1110

## 2017-03-24 NOTE — ED Triage Notes (Signed)
Pt presents with lower back pain and shortness of breath started last night. Pt states back pain radiates into chest area.

## 2017-03-24 NOTE — ED Notes (Signed)
AAOx3.  Skin warm and dry.  NAD 

## 2017-03-24 NOTE — Progress Notes (Signed)
ANTICOAGULATION CONSULT NOTE - Initial Consult  Pharmacy Consult for Heparin Indication: pulmonary embolus  No Known Allergies  Patient Measurements: Weight: 285 lb (129.3 kg) Heparin Dosing Weight: 108.7 kg  Vital Signs: Temp: 98.7 F (37.1 C) (06/04 0841) Temp Source: Oral (06/04 0841) BP: 142/92 (06/04 0944) Pulse Rate: 73 (06/04 0944)  Labs:  Recent Labs  03/24/17 0855  HGB 14.3  HCT 42.1  PLT 171  CREATININE 1.15  TROPONINI <0.03    Estimated Creatinine Clearance: 91.5 mL/min (by C-G formula based on SCr of 1.15 mg/dL).   Medical History: Past Medical History:  Diagnosis Date  . Glaucoma   . Hx of pulmonary embolus   . Myocardial infarction (HCC)   . Past heart attack      Assessment: 66 yo male with hx of PE currently off anticoagulation with new PE per CTA. Called RN who was at pt's bedside. PTA med list (not current) lists Xarelto; per pt via RN pt stopped Xarelto about 8 months ago, not on any anticoagulants currently or in last 6-8 months. Upon order entry got an alert for possible epidural order and anticoagulant. Per RN pt's history does not mention any epidural/spinal injections and pt without epidural presently. Spoke with pt via phone as well; pt states he has never received an epidural or any spinal injections for pain or numbing via the spine for any procedures. Injections in January were into his knee.    Goal of Therapy:  Heparin level 0.3-0.7 units/ml Monitor platelets by anticoagulation protocol: Yes   Plan:  Stat aPTT and INR Heparin bolus 5000 units IV x1 then 1750 units/hr (=17.5 ml/hr) Heparin level in 6h after start of infusion CBC in AM  Pharmacy will continue to follow.   Crist FatWang, Sara Selvidge L 03/24/2017,10:59 AM

## 2017-03-24 NOTE — ED Notes (Signed)
To CT Scan via wheelchair.  AAOx3.  Skin warm and dry.  NAD

## 2017-03-24 NOTE — Care Management Obs Status (Signed)
MEDICARE OBSERVATION STATUS NOTIFICATION   Patient Details  Name: Jeff GheeGlenn Tsuchiya MRN: [id number] Date of Birth: [birth year]   Medicare Observation Status Notification Given:   Yes    Berna BueCheryl Calia Napp, RN 03/24/2017, 12:50 PM

## 2017-03-25 ENCOUNTER — Inpatient Hospital Stay: Payer: Medicare PPO

## 2017-03-25 DIAGNOSIS — N179 Acute kidney failure, unspecified: Secondary | ICD-10-CM | POA: Diagnosis present

## 2017-03-25 DIAGNOSIS — I252 Old myocardial infarction: Secondary | ICD-10-CM | POA: Diagnosis not present

## 2017-03-25 DIAGNOSIS — J189 Pneumonia, unspecified organism: Secondary | ICD-10-CM | POA: Diagnosis present

## 2017-03-25 DIAGNOSIS — Z79899 Other long term (current) drug therapy: Secondary | ICD-10-CM | POA: Diagnosis not present

## 2017-03-25 DIAGNOSIS — Z86718 Personal history of other venous thrombosis and embolism: Secondary | ICD-10-CM | POA: Diagnosis not present

## 2017-03-25 DIAGNOSIS — I2510 Atherosclerotic heart disease of native coronary artery without angina pectoris: Secondary | ICD-10-CM | POA: Diagnosis present

## 2017-03-25 DIAGNOSIS — Z86711 Personal history of pulmonary embolism: Secondary | ICD-10-CM | POA: Diagnosis not present

## 2017-03-25 DIAGNOSIS — R0602 Shortness of breath: Secondary | ICD-10-CM | POA: Diagnosis present

## 2017-03-25 DIAGNOSIS — I82442 Acute embolism and thrombosis of left tibial vein: Secondary | ICD-10-CM | POA: Diagnosis present

## 2017-03-25 DIAGNOSIS — H409 Unspecified glaucoma: Secondary | ICD-10-CM | POA: Diagnosis present

## 2017-03-25 DIAGNOSIS — Z8249 Family history of ischemic heart disease and other diseases of the circulatory system: Secondary | ICD-10-CM | POA: Diagnosis not present

## 2017-03-25 DIAGNOSIS — I2699 Other pulmonary embolism without acute cor pulmonale: Secondary | ICD-10-CM | POA: Diagnosis present

## 2017-03-25 DIAGNOSIS — M7120 Synovial cyst of popliteal space [Baker], unspecified knee: Secondary | ICD-10-CM | POA: Diagnosis present

## 2017-03-25 LAB — BASIC METABOLIC PANEL
ANION GAP: 9 (ref 5–15)
BUN: 18 mg/dL (ref 6–20)
CHLORIDE: 103 mmol/L (ref 101–111)
CO2: 25 mmol/L (ref 22–32)
Calcium: 9 mg/dL (ref 8.9–10.3)
Creatinine, Ser: 1.32 mg/dL — ABNORMAL HIGH (ref 0.61–1.24)
GFR calc non Af Amer: 55 mL/min — ABNORMAL LOW (ref >60)
Glucose, Bld: 116 mg/dL — ABNORMAL HIGH (ref 65–99)
Potassium: 4.8 mmol/L (ref 3.5–5.1)
SODIUM: 137 mmol/L (ref 135–145)

## 2017-03-25 LAB — HIV ANTIBODY (ROUTINE TESTING W REFLEX): HIV SCREEN 4TH GENERATION: NONREACTIVE

## 2017-03-25 LAB — BETA-2-GLYCOPROTEIN I ABS, IGG/M/A: Beta-2-Glycoprotein I IgA: <9 GPI IgA units (ref 0–25)

## 2017-03-25 LAB — CBC
HEMATOCRIT: 42.6 % (ref 40.0–52.0)
HEMOGLOBIN: 14.5 g/dL (ref 13.0–18.0)
MCH: 32.2 pg (ref 26.0–34.0)
MCHC: 34 g/dL (ref 32.0–36.0)
MCV: 94.6 fL (ref 80.0–100.0)
Platelets: 189 10*3/uL (ref 150–440)
RBC: 4.5 MIL/uL (ref 4.40–5.90)
RDW: 13.4 % (ref 11.5–14.5)
WBC: 11.9 10*3/uL — AB (ref 3.8–10.6)

## 2017-03-25 LAB — CARDIOLIPIN ANTIBODIES, IGG, IGM, IGA
Anticardiolipin IgA: <9 APL U/mL (ref 0–11)
Anticardiolipin IgM: <9 MPL U/mL (ref 0–12)

## 2017-03-25 LAB — HEPARIN LEVEL (UNFRACTIONATED)
HEPARIN UNFRACTIONATED: 0.36 [IU]/mL (ref 0.30–0.70)
HEPARIN UNFRACTIONATED: 0.32 [IU]/mL (ref 0.30–0.70)
HEPARIN UNFRACTIONATED: 0.36 [IU]/mL (ref 0.30–0.70)

## 2017-03-25 LAB — ECHOCARDIOGRAM COMPLETE
Height: 73 in
Weight: 4656 oz

## 2017-03-25 LAB — PROTEIN C, TOTAL: Protein C, Total: 64 % (ref 60–150)

## 2017-03-25 LAB — HOMOCYSTEINE: HOMOCYSTEINE-NORM: 13.8 umol/L (ref 0.0–15.0)

## 2017-03-25 MED ORDER — SODIUM CHLORIDE 0.9 % IV SOLN
INTRAVENOUS | Status: AC
  Administered 2017-03-25: 12:00:00 via INTRAVENOUS

## 2017-03-25 NOTE — Progress Notes (Signed)
[facility] [place] [facility]   PATIENT NAME: Jeff Leonard    MR#:  [id number]  DATE OF BIRTH:  [birth year]  SUBJECTIVE:  CHIEF COMPLAINT:  Patient is reporting chest pain with deep breathing.  REVIEW OF SYSTEMS:  CONSTITUTIONAL: No fever, fatigue or weakness.  EYES: No blurred or double vision.  EARS, NOSE, AND THROAT: No tinnitus or ear pain.  RESPIRATORY: No cough, shortness of breath, wheezing or hemoptysis.  CARDIOVASCULAR: Reports chest pain with deep breathing.  GASTROINTESTINAL: No nausea, vomiting, diarrhea or abdominal pain.  GENITOURINARY: No dysuria, hematuria.  ENDOCRINE: No polyuria, nocturia,  HEMATOLOGY: No anemia, easy bruising or bleeding SKIN: No rash or lesion. MUSCULOSKELETAL: No joint pain or arthritis.   NEUROLOGIC: No tingling, numbness, weakness.  PSYCHIATRY: No anxiety or depression.   DRUG ALLERGIES:  No Known Allergies  VITALS:  Blood pressure 133/80, pulse 81, temperature 98.7 F (37.1 C), temperature source Oral, resp. rate 19, height 6\' 1"  (1.854 m), weight 132 kg (291 lb), SpO2 94 %.  PHYSICAL EXAMINATION:  GENERAL:  66 y.o.-year-old patient lying in the bed with no acute distress.  EYES: Pupils equal, round, reactive to light and accommodation. No scleral icterus. Extraocular muscles intact.  HEENT: Head atraumatic, normocephalic. Oropharynx and nasopharynx clear.  NECK:  Supple, no jugular venous distention. No thyroid enlargement, no tenderness.  LUNGS: Normal breath sounds bilaterally, no wheezing, rales,rhonchi or crepitation. No use of accessory muscles of respiration.  CARDIOVASCULAR: S1, S2 normal. No murmurs, rubs, or gallops.  ABDOMEN: Soft, nontender, nondistended. Bowel sounds present. No organomegaly or mass.  EXTREMITIES: No pedal edema, cyanosis, or clubbing.  NEUROLOGIC: Cranial nerves II through XII are intact. Muscle strength 5/5 in all extremities. Sensation intact. Gait not checked.   PSYCHIATRIC: The patient is alert and oriented x 3.  SKIN: No obvious rash, lesion, or ulcer.    LABORATORY PANEL:   CBC  Recent Labs Lab 03/25/17 0158  WBC 11.9*  HGB 14.5  HCT 42.6  PLT 189   ------------------------------------------------------------------------------------------------------------------  Chemistries   Recent Labs Lab 03/24/17 0855 03/25/17 0158  NA 135 137  K 3.9 4.8  CL 102 103  CO2 26 25  GLUCOSE 122* 116*  BUN 15 18  CREATININE 1.15 1.32*  CALCIUM 8.9 9.0  AST 17  --   ALT 17  --   ALKPHOS 59  --   BILITOT 1.5*  --    ------------------------------------------------------------------------------------------------------------------  Cardiac Enzymes  Recent Labs Lab 03/24/17 0855  TROPONINI <0.03   ------------------------------------------------------------------------------------------------------------------  RADIOLOGY:  Ct Angio Chest Pe W Or Wo Contrast  Result Date: 03/24/2017 CLINICAL DATA:  Shortness of Breath EXAM: CT ANGIOGRAPHY CHEST WITH CONTRAST TECHNIQUE: Multidetector CT imaging of the chest was performed using the standard protocol during bolus administration of intravenous contrast. Multiplanar CT image reconstructions and MIPs were obtained to evaluate the vascular anatomy. CONTRAST:  100 mL Isovue 370 nonionic COMPARISON:  Chest radiograph July 20, 2013. FINDINGS: Cardiovascular: There is extensive pulmonary embolism arising from from the distal left main pulmonary artery extending into multiple upper and lower lobe branches on the left. On the right, there are multiple pulmonary emboli and upper and lower lobe pulmonary arteries which arises distal to the main pulmonary outflow tract. The right ventricle to left ventricle diameter ratio is 1.0, concerning for right heart strain. There is no appreciable thoracic aortic aneurysm or dissection. The visualized great vessels appear unremarkable. Pericardium is not  appreciably thickened. Mediastinum/Nodes: Visualized thyroid appears unremarkable. There  are scattered subcentimeter mediastinal lymph nodes but no frank adenopathy is evident by size criteria. Lungs/Pleura: There is patchy airspace opacity in the lower lobes bilaterally consistent with patchy pneumonia in the bases. There is atelectatic change in the inferior most aspect of the lingula and right middle lobe. There is a small right pleural effusion. Upper Abdomen: There is apparent sludge in the gallbladder. Visualized upper abdominal structures otherwise appear unremarkable. Musculoskeletal: There is degenerative change in the thoracic spine. There are no blastic or lytic bone lesions. Review of the MIP images confirms the above findings. IMPRESSION: Extensive multifocal pulmonary embolus bilaterally with evidence suggesting a degree of right heart strain. Positive for acute PE with CT evidence of right heart strain (RV/LV Ratio = 1.0) consistent with at least submassive (intermediate risk) PE. The presence of right heart strain has been associated with an increased risk of morbidity and mortality. Please activate Code PE by paging [phone]. No thoracic aortic aneurysm or dissection. Patchy infiltrate, likely pneumonia, in the bases. Small right pleural effusion. No evident adenopathy. Apparent sludge in gallbladder. Critical Value/emergent results were called by telephone at the time of interpretation on 03/24/2017 at 10:22 am to Dr. Gladstone Pih , who verbally acknowledged these results. Electronically Signed   By: Bretta Bang III M.D.   On: 03/24/2017 10:22    EKG:   Orders placed or performed during the hospital encounter of 03/24/17  . EKG 12-Lead  . EKG 12-Lead    ASSESSMENT AND PLAN:   * Acute pulmonary emboli    on heparin IV drip  Echocardiogram is pending if it is normal will start him on Xarelto , he needs to be anticoagulated lifetime  as this is a second episode of pulmonary  embolism Outpatient follow-up with hem oncology for coag workup   Check bilateral lower extremity venous Dopplers to rule out clots Appreciate pulmonology recommendations  *Pleuritic chest pain from pulmonary embolism Tramadol as needed  * aki - ivf , rpt BMP am  * Glaucoma   Continue eye drops.      All the records are reviewed and case discussed with Care Management/Social Workerr. Management plans discussed with the patient, family and they are in agreement.  CODE STATUS: fc   TOTAL TIME TAKING CARE OF THIS PATIENT: 36  minutes.   POSSIBLE D/C IN 1-2 DAYS, DEPENDING ON CLINICAL CONDITION.  Note: This dictation was prepared with Dragon dictation along with smaller phrase technology. Any transcriptional errors that result from this process are unintentional.   Ramonita Lab M.D on 03/25/2017 at 10:15 AM  Between 7am to 6pm - Pager - [phone] After 6pm go to [email or web address] - password [email or web address] [facility]  [place] [facility]  Office  [phone]  CC: Primary care physician; Sherrie Mustache

## 2017-03-25 NOTE — Progress Notes (Signed)
ANTICOAGULATION CONSULT NOTE -Follow Up Consult  Pharmacy Consult for Heparin Indication: pulmonary embolus  No Known Allergies  Patient Measurements: Height: 6\' 1"  (185.4 cm) Weight: 291 lb (132 kg) IBW/kg (Calculated) : 79.9 Heparin Dosing Weight: 108.7 kg  Vital Signs: Temp: 98.7 F (37.1 C) (06/05 0419) Temp Source: Oral (06/05 0419) BP: 133/80 (06/05 0419) Pulse Rate: 81 (06/05 0419)  Labs:  Recent Labs  03/24/17 0855 03/24/17 1049 03/24/17 1952 03/25/17 0158 03/25/17 0755  HGB 14.3  --   --  14.5  --   HCT 42.1  --   --  42.6  --   PLT 171  --   --  189  --   APTT  --  43*  --   --   --   LABPROT  --  14.3  --   --   --   INR  --  1.11  --   --   --   HEPARINUNFRC  --   --  0.55 0.36 0.32  CREATININE 1.15  --   --  1.32*  --   TROPONINI <0.03  --   --   --   --     Estimated Creatinine Clearance: 80.5 mL/min (A) (by C-G formula based on SCr of 1.32 mg/dL (H)).   Medical History: Past Medical History:  Diagnosis Date  . Glaucoma   . Hx of pulmonary embolus   . Myocardial infarction (HCC)   . Past heart attack      Assessment: 66 yo male with hx of PE currently off anticoagulation with new PE per CTA. Called RN who was at pt's bedside. PTA med list (not current) lists Xarelto; per pt via RN pt stopped Xarelto about 8 months ago, not on any anticoagulants currently or in last 6-8 months. Upon order entry got an alert for possible epidural order and anticoagulant. Per RN pt's history does not mention any epidural/spinal injections and pt without epidural presently. Spoke with pt via phone as well; pt states he has never received an epidural or any spinal injections for pain or numbing via the spine for any procedures. Injections in January were into his knee.    Goal of Therapy:  Heparin level 0.3-0.7 units/ml Monitor platelets by anticoagulation protocol: Yes   Plan:  Stat aPTT and INR Heparin bolus 5000 units IV x1 then 1750 units/hr (=17.5  ml/hr) Heparin level in 6h after start of infusion CBC in AM    03/24/17 ~ 20:00 HL = 0.55. Will continue current drip rate. Will recheck level in 6 hours on 6/5 at 02:00.    6/5 @ 0200 HL 0.36 therapeutic. Will increase rate to 1800 units/hr from 1750 units/hr as HL was in range but in lower end and patient has acute PE. Will recheck HL @ 0800.  6/5 @ 0800 HL=0.32 therapeutic, but HL has been dropping dispite increase in rate. Will increase rate to 1850 units/hr and recheck in 6 hours.   Pharmacy will continue to follow.   Olene FlossMelissa D Vonn Sliger, Pharm.D, BCPS Clinical Pharmacist  03/25/2017

## 2017-03-25 NOTE — Progress Notes (Signed)
Education provided by printed material and teach back method about Heparin drip and pulmonary embolism. Patient verbalized understanding on current plan of care.

## 2017-03-25 NOTE — Progress Notes (Signed)
ANTICOAGULATION CONSULT NOTE -Follow Up Consult  Pharmacy Consult for Heparin Indication: pulmonary embolus  No Known Allergies  Patient Measurements: Height: 6\' 1"  (185.4 cm) Weight: 291 lb (132 kg) IBW/kg (Calculated) : 79.9 Heparin Dosing Weight: 108.7 kg  Vital Signs: Temp: 98.2 F (36.8 C) (06/05 2002) Temp Source: Oral (06/05 2002) BP: 127/65 (06/05 2002) Pulse Rate: 72 (06/05 2002)  Labs:  Recent Labs  03/24/17 0855 03/24/17 1049  03/25/17 0158 03/25/17 0755 03/25/17 2042  HGB 14.3  --   --  14.5  --   --   HCT 42.1  --   --  42.6  --   --   PLT 171  --   --  189  --   --   APTT  --  43*  --   --   --   --   LABPROT  --  14.3  --   --   --   --   INR  --  1.11  --   --   --   --   HEPARINUNFRC  --   --   < > 0.36 0.32 0.36  CREATININE 1.15  --   --  1.32*  --   --   TROPONINI <0.03  --   --   --   --   --   < > = values in this interval not displayed.  Estimated Creatinine Clearance: 80.5 mL/min (A) (by C-G formula based on SCr of 1.32 mg/dL (H)).   Medical History: Past Medical History:  Diagnosis Date  . Glaucoma   . Hx of pulmonary embolus   . Myocardial infarction (HCC)   . Past heart attack      Assessment: 66 yo male with hx of PE currently off anticoagulation with new PE per CTA. Called RN who was at pt's bedside. PTA med list (not current) lists Xarelto; per pt via RN pt stopped Xarelto about 8 months ago, not on any anticoagulants currently or in last 6-8 months. Upon order entry got an alert for possible epidural order and anticoagulant. Per RN pt's history does not mention any epidural/spinal injections and pt without epidural presently. Spoke with pt via phone as well; pt states he has never received an epidural or any spinal injections for pain or numbing via the spine for any procedures. Injections in January were into his knee.    Goal of Therapy:  Heparin level 0.3-0.7 units/ml Monitor platelets by anticoagulation protocol: Yes    Plan:  Stat aPTT and INR Heparin bolus 5000 units IV x1 then 1750 units/hr (=17.5 ml/hr) Heparin level in 6h after start of infusion CBC in AM    03/24/17 ~ 20:00 HL = 0.55. Will continue current drip rate. Will recheck level in 6 hours on 6/5 at 02:00.    6/5 @ 0200 HL 0.36 therapeutic. Will increase rate to 1800 units/hr from 1750 units/hr as HL was in range but in lower end and patient has acute PE. Will recheck HL @ 0800.  6/5 @ 0800 HL=0.32 therapeutic, but HL has been dropping dispite increase in rate. Will increase rate to 1850 units/hr and recheck in 6 hours.   6/5 : HL @ 20:42 = 0.36.  Will continue this pt on current rate and recheck HL on 6/6 with AM labs.   Woods Gangemi D Clinical Pharmacist  03/25/2017

## 2017-03-25 NOTE — Progress Notes (Signed)
ANTICOAGULATION CONSULT NOTE -Follow Up Consult  Pharmacy Consult for Heparin Indication: pulmonary embolus  No Known Allergies  Patient Measurements: Height: 6\' 1"  (185.4 cm) Weight: 291 lb (132 kg) IBW/kg (Calculated) : 79.9 Heparin Dosing Weight: 108.7 kg  Vital Signs: Temp: 98.3 F (36.8 C) (06/04 2004) Temp Source: Oral (06/04 2004) BP: 167/88 (06/04 2004) Pulse Rate: 75 (06/04 2004)  Labs:  Recent Labs  03/24/17 0855 03/24/17 1049 03/24/17 1952 03/25/17 0158  HGB 14.3  --   --  14.5  HCT 42.1  --   --  42.6  PLT 171  --   --  189  APTT  --  43*  --   --   LABPROT  --  14.3  --   --   INR  --  1.11  --   --   HEPARINUNFRC  --   --  0.55 0.36  CREATININE 1.15  --   --  1.32*  TROPONINI <0.03  --   --   --     Estimated Creatinine Clearance: 80.5 mL/min (A) (by C-G formula based on SCr of 1.32 mg/dL (H)).   Medical History: Past Medical History:  Diagnosis Date  . Glaucoma   . Hx of pulmonary embolus   . Myocardial infarction (HCC)   . Past heart attack      Assessment: 66 yo male with hx of PE currently off anticoagulation with new PE per CTA. Called RN who was at pt's bedside. PTA med list (not current) lists Xarelto; per pt via RN pt stopped Xarelto about 8 months ago, not on any anticoagulants currently or in last 6-8 months. Upon order entry got an alert for possible epidural order and anticoagulant. Per RN pt's history does not mention any epidural/spinal injections and pt without epidural presently. Spoke with pt via phone as well; pt states he has never received an epidural or any spinal injections for pain or numbing via the spine for any procedures. Injections in January were into his knee.    Goal of Therapy:  Heparin level 0.3-0.7 units/ml Monitor platelets by anticoagulation protocol: Yes   Plan:  Stat aPTT and INR Heparin bolus 5000 units IV x1 then 1750 units/hr (=17.5 ml/hr) Heparin level in 6h after start of infusion CBC in AM     03/24/17 ~ 20:00 HL = 0.55. Will continue current drip rate. Will recheck level in 6 hours on 6/5 at 02:00.    6/5 @ 0200 HL 0.36 therapeutic. Will increase rate to 1800 units/hr from 1750 units/hr as HL was in range but in lower end and patient has acute PE. Will recheck HL @ 0800.  Pharmacy will continue to follow.   Thomasene Rippleavid Jeffrie Stander, PharmD, BCPS Clinical Pharmacist 03/25/2017

## 2017-03-26 LAB — BASIC METABOLIC PANEL
ANION GAP: 6 (ref 5–15)
BUN: 23 mg/dL — AB (ref 6–20)
CO2: 26 mmol/L (ref 22–32)
Calcium: 8.7 mg/dL — ABNORMAL LOW (ref 8.9–10.3)
Chloride: 104 mmol/L (ref 101–111)
Creatinine, Ser: 1.37 mg/dL — ABNORMAL HIGH (ref 0.61–1.24)
GFR calc Af Amer: >60 mL/min (ref >60)
GFR calc non Af Amer: 53 mL/min — ABNORMAL LOW (ref >60)
GLUCOSE: 114 mg/dL — AB (ref 65–99)
POTASSIUM: 4.7 mmol/L (ref 3.5–5.1)
Sodium: 136 mmol/L (ref 135–145)

## 2017-03-26 LAB — CBC
HEMATOCRIT: 41 % (ref 40.0–52.0)
HEMOGLOBIN: 13.9 g/dL (ref 13.0–18.0)
MCH: 32.4 pg (ref 26.0–34.0)
MCHC: 33.8 g/dL (ref 32.0–36.0)
MCV: 95.6 fL (ref 80.0–100.0)
Platelets: 187 10*3/uL (ref 150–440)
RBC: 4.29 MIL/uL — ABNORMAL LOW (ref 4.40–5.90)
RDW: 13.7 % (ref 11.5–14.5)
WBC: 11.1 10*3/uL — ABNORMAL HIGH (ref 3.8–10.6)

## 2017-03-26 LAB — DRVVT CONFIRM: DRVVT CONFIRM: 1.3 ratio — AB (ref 0.8–1.2)

## 2017-03-26 LAB — HEPARIN LEVEL (UNFRACTIONATED)
Heparin Unfractionated: 0.19 IU/mL — ABNORMAL LOW (ref 0.30–0.70)
Heparin Unfractionated: 0.43 IU/mL (ref 0.30–0.70)

## 2017-03-26 LAB — PTT-LA MIX: PTT-LA Mix: 56.8 s — ABNORMAL HIGH (ref 0.0–48.9)

## 2017-03-26 LAB — HEXAGONAL PHASE PHOSPHOLIPID: Hexagonal Phase Phospholipid: 17 s — ABNORMAL HIGH (ref 0–11)

## 2017-03-26 LAB — PROTEIN C ACTIVITY: Protein C Activity: 92 % (ref 73–180)

## 2017-03-26 LAB — LUPUS ANTICOAGULANT PANEL
DRVVT: 60.8 s — ABNORMAL HIGH (ref 0.0–47.0)
PTT LA: 59.5 s — AB (ref 0.0–51.9)

## 2017-03-26 LAB — PROTEIN S, TOTAL: PROTEIN S AG TOTAL: 94 % (ref 60–150)

## 2017-03-26 LAB — DRVVT MIX: DRVVT MIX: 47.1 s — AB (ref 0.0–47.0)

## 2017-03-26 LAB — PROTEIN S ACTIVITY: PROTEIN S ACTIVITY: 83 % (ref 63–140)

## 2017-03-26 MED ORDER — RIVAROXABAN 15 MG PO TABS
15.0000 mg | ORAL_TABLET | Freq: Two times a day (BID) | ORAL | Status: DC
  Administered 2017-03-26 – 2017-03-27 (×2): 15 mg via ORAL
  Filled 2017-03-26 (×3): qty 1

## 2017-03-26 MED ORDER — HEPARIN BOLUS VIA INFUSION
3500.0000 [IU] | Freq: Once | INTRAVENOUS | Status: AC
Start: 2017-03-26 — End: 2017-03-26
  Administered 2017-03-26: 05:00:00 3500 [IU] via INTRAVENOUS
  Filled 2017-03-26: qty 3500

## 2017-03-26 MED ORDER — RIVAROXABAN (XARELTO) VTE STARTER PACK (15 & 20 MG): ORAL_TABLET | ORAL | 0 refills | Status: DC

## 2017-03-26 NOTE — Progress Notes (Signed)
ANTICOAGULATION CONSULT NOTE -Follow Up Consult  Pharmacy Consult for Heparin Indication: pulmonary embolus  No Known Allergies  Patient Measurements: Height: 6\' 1"  (185.4 cm) Weight: 291 lb (132 kg) IBW/kg (Calculated) : 79.9 Heparin Dosing Weight: 108.7 kg  Vital Signs: Temp: 99.1 F (37.3 C) (06/06 1024) Temp Source: Oral (06/06 1024) BP: 118/63 (06/06 1024) Pulse Rate: 75 (06/06 1024)  Labs:  Recent Labs  03/24/17 0855 03/24/17 1049  03/25/17 0158  03/25/17 2042 03/26/17 0356 03/26/17 0951  HGB 14.3  --   --  14.5  --   --  13.9  --   HCT 42.1  --   --  42.6  --   --  41.0  --   PLT 171  --   --  189  --   --  187  --   APTT  --  43*  --   --   --   --   --   --   LABPROT  --  14.3  --   --   --   --   --   --   INR  --  1.11  --   --   --   --   --   --   HEPARINUNFRC  --   --   < > 0.36  < > 0.36 0.19* 0.43  CREATININE 1.15  --   --  1.32*  --   --  1.37*  --   TROPONINI <0.03  --   --   --   --   --   --   --   < > = values in this interval not displayed.  Estimated Creatinine Clearance: 77.6 mL/min (A) (by C-G formula based on SCr of 1.37 mg/dL (H)).   Medical History: Past Medical History:  Diagnosis Date  . Glaucoma   . Hx of pulmonary embolus   . Myocardial infarction (HCC)   . Past heart attack      Assessment: 66 yo male with hx of PE currently off anticoagulation with new PE per CTA. Called RN who was at pt's bedside. PTA med list (not current) lists Xarelto; per pt via RN pt stopped Xarelto about 8 months ago, not on any anticoagulants currently or in last 6-8 months. Upon order entry got an alert for possible epidural order and anticoagulant. Per RN pt's history does not mention any epidural/spinal injections and pt without epidural presently. Spoke with pt via phone as well; pt states he has never received an epidural or any spinal injections for pain or numbing via the spine for any procedures. Injections in January were into his knee.     Goal of Therapy:  Heparin level 0.3-0.7 units/ml Monitor platelets by anticoagulation protocol: Yes   Plan:  Stat aPTT and INR Heparin bolus 5000 units IV x1 then 1750 units/hr (=17.5 ml/hr) Heparin level in 6h after start of infusion CBC in AM    03/24/17 ~ 20:00 HL = 0.55. Will continue current drip rate. Will recheck level in 6 hours on 6/5 at 02:00.    6/5 @ 0200 HL 0.36 therapeutic. Will increase rate to 1800 units/hr from 1750 units/hr as HL was in range but in lower end and patient has acute PE. Will recheck HL @ 0800.  6/5 @ 0800 HL=0.32 therapeutic, but HL has been dropping dispite increase in rate. Will increase rate to 1850 units/hr and recheck in 6 hours.   6/5 : HL @ 20:42 = 0.36.  Will continue this pt on current rate and recheck HL on 6/6 with AM labs.   6/6 @ 0400 HL 0.19 subtherapeutic. Will rebolus w/ heparin 3500 units IV x 1 and increase rate to 2000 units/hr. Will recheck HL @ 1000. CBC stable.  6/6 @ 0951 HL= 0.43 therapeutic. Continue drip at same rate and repeat in 6 hours  Thank you for this consult.  Olene Floss, Pharm.D, BCPS Clinical Pharmacist  03/26/2017

## 2017-03-26 NOTE — Care Management (Signed)
Coupon for Free 30-day trial offer for Xarelto given to [name] Tammi Boulier RN MSN CCM Care Management [phone]

## 2017-03-26 NOTE — Progress Notes (Signed)
Pt is stable and ready for discharge. First dose of Xarelto to be given at 1700 and Heparin drip to be stopped. Discharge paperwork completed with Xarelto prescription attached. Reviewed with patient who verbalizes understanding. Patient's wife to transport home.

## 2017-03-26 NOTE — Progress Notes (Signed)
Pt wife just arrived to transport patient home. Pt increased SOB and feels very weak. Pt does not feel ready to go home at this time. VSS. On call MD, Dr. Luberta MutterKonidena notified and stated she will let Dr. Elisabeth PigeonVachhani know discharge is cancelled.

## 2017-03-26 NOTE — Progress Notes (Signed)
ANTICOAGULATION CONSULT NOTE -Follow Up Consult  Pharmacy Consult for Heparin Indication: pulmonary embolus  No Known Allergies  Patient Measurements: Height: 6\' 1"  (185.4 cm) Weight: 291 lb (132 kg) IBW/kg (Calculated) : 79.9 Heparin Dosing Weight: 108.7 kg  Vital Signs: Temp: 99.4 F (37.4 C) (06/06 0417) Temp Source: Oral (06/06 0417) BP: 122/71 (06/06 0417) Pulse Rate: 76 (06/06 0417)  Labs:  Recent Labs  03/24/17 0855 03/24/17 1049  03/25/17 0158 03/25/17 0755 03/25/17 2042 03/26/17 0356  HGB 14.3  --   --  14.5  --   --  13.9  HCT 42.1  --   --  42.6  --   --  41.0  PLT 171  --   --  189  --   --  187  APTT  --  43*  --   --   --   --   --   LABPROT  --  14.3  --   --   --   --   --   INR  --  1.11  --   --   --   --   --   HEPARINUNFRC  --   --   < > 0.36 0.32 0.36 0.19*  CREATININE 1.15  --   --  1.32*  --   --  1.37*  TROPONINI <0.03  --   --   --   --   --   --   < > = values in this interval not displayed.  Estimated Creatinine Clearance: 77.6 mL/min (A) (by C-G formula based on SCr of 1.37 mg/dL (H)).   Medical History: Past Medical History:  Diagnosis Date  . Glaucoma   . Hx of pulmonary embolus   . Myocardial infarction (HCC)   . Past heart attack      Assessment: 66 yo male with hx of PE currently off anticoagulation with new PE per CTA. Called RN who was at pt's bedside. PTA med list (not current) lists Xarelto; per pt via RN pt stopped Xarelto about 8 months ago, not on any anticoagulants currently or in last 6-8 months. Upon order entry got an alert for possible epidural order and anticoagulant. Per RN pt's history does not mention any epidural/spinal injections and pt without epidural presently. Spoke with pt via phone as well; pt states he has never received an epidural or any spinal injections for pain or numbing via the spine for any procedures. Injections in January were into his knee.    Goal of Therapy:  Heparin level 0.3-0.7  units/ml Monitor platelets by anticoagulation protocol: Yes   Plan:  Stat aPTT and INR Heparin bolus 5000 units IV x1 then 1750 units/hr (=17.5 ml/hr) Heparin level in 6h after start of infusion CBC in AM    03/24/17 ~ 20:00 HL = 0.55. Will continue current drip rate. Will recheck level in 6 hours on 6/5 at 02:00.    6/5 @ 0200 HL 0.36 therapeutic. Will increase rate to 1800 units/hr from 1750 units/hr as HL was in range but in lower end and patient has acute PE. Will recheck HL @ 0800.  6/5 @ 0800 HL=0.32 therapeutic, but HL has been dropping dispite increase in rate. Will increase rate to 1850 units/hr and recheck in 6 hours.   6/5 : HL @ 20:42 = 0.36.  Will continue this pt on current rate and recheck HL on 6/6 with AM labs.   6/6 @ 0400 HL 0.19 subtherapeutic. Will rebolus w/ heparin  3500 units IV x 1 and increase rate to 2000 units/hr. Will recheck HL @ 1000. CBC stable.  Thank you for this consult.  Thomasene Ripple, PharmD, BCPS Clinical Pharmacist 03/26/2017

## 2017-03-26 NOTE — Discharge Summary (Signed)
[facility] - [place] at [facility]   PATIENT NAME: Jeff GheeGlenn Rezendes    MR#:  [id number]  DATE OF BIRTH:  1951-07-13  DATE OF ADMISSION:  03/24/2017 ADMITTING PHYSICIAN: Altamese DillingVaibhavkumar Raylei Losurdo, MD  DATE OF DISCHARGE: 03/26/2017   PRIMARY CARE PHYSICIAN: Sherrie MustacheJadali, Fayegh, MD    ADMISSION DIAGNOSIS:  Shortness of breath [R06.02] Other acute pulmonary embolism without acute cor pulmonale (HCC) [I26.99]  DISCHARGE DIAGNOSIS:  Principal Problem:   Pulmonary embolism (HCC) Active Problems:   Pulmonary emboli (HCC)   SECONDARY DIAGNOSIS:   Past Medical History:  Diagnosis Date  . Glaucoma   . Hx of pulmonary embolus   . Myocardial infarction (HCC)   . Past heart attack     HOSPITAL COURSE:   * Acute pulmonary emboli  on heparin IV drip    Outpatient follow-up with hem oncology for coag workup Checked bilateral lower extremity venous Dopplers shows non occlusive left calf DVT.   I spoke to Dr. Wyn Quakerew on phone- he suggest to just cont anticoagulants.   Appreciate pulmonology recommendations  hypercoagulable work ups sent, advise to follow in hematology clinic.  *Pleuritic chest pain from pulmonary embolism Tramadol as needed  * aki - ivf , rpt BMP am  * Glaucoma Continue eye drops.  DISCHARGE CONDITIONS:   Stable.  CONSULTS OBTAINED:    DRUG ALLERGIES:  No Known Allergies  DISCHARGE MEDICATIONS:   Current Discharge Medication List    START taking these medications   Details  Rivaroxaban 15 & 20 MG TBPK Take as directed on package: Start with one 15mg  tablet by mouth twice a day with food. On Day 22, switch to one 20mg  tablet once a day with food. Qty: 51 each, Refills: 0      CONTINUE these medications which have NOT CHANGED   Details  Brinzolamide-Brimonidine (SIMBRINZA) 1-0.2 % SUSP INSTILL 1 DROP IN BOTH EYES BID    timolol (TIMOPTIC) 0.25 % ophthalmic solution Apply to eye.    travoprost, benzalkonium, (TRAVATAN) 0.004 %  ophthalmic solution Place 1 drop into both eyes at bedtime.    gabapentin (NEURONTIN) 300 MG capsule Take 300 mg by mouth.    HYDROcodone-acetaminophen (NORCO/VICODIN) 5-325 MG tablet Take 1 tablet by mouth 3 (three) times daily as needed for moderate pain.    Vitamin D, Ergocalciferol, (DRISDOL) 50000 units CAPS capsule Take by mouth.         DISCHARGE INSTRUCTIONS:    Follow in cancer center.  If you experience worsening of your admission symptoms, develop shortness of breath, life threatening emergency, suicidal or homicidal thoughts you must seek medical attention immediately by calling 911 or calling your MD immediately  if symptoms less severe.  You Must read complete instructions/literature along with all the possible adverse reactions/side effects for all the Medicines you take and that have been prescribed to you. Take any new Medicines after you have completely understood and accept all the possible adverse reactions/side effects.   Please note  You were cared for by a hospitalist during your hospital stay. If you have any questions about your discharge medications or the care you received while you were in the hospital after you are discharged, you can call the unit and asked to speak with the hospitalist on call if the hospitalist that took care of you is not available. Once you are discharged, your primary care physician will handle any further medical issues. Please note that NO REFILLS for any discharge medications will be authorized once you are  discharged, as it is imperative that you return to your primary care physician (or establish a relationship with a primary care physician if you do not have one) for your aftercare needs so that they can reassess your need for medications and monitor your lab values.    Today   CHIEF COMPLAINT:   Chief Complaint  Patient presents with  . Shortness of Breath    HISTORY OF PRESENT ILLNESS:  Jeff Leonard  is a 66 y.o. male  with a known history of Pulmonary emboli myocardial infarction, was on Xarelto from 2014 to 2016, then stopped. Since yesterday started having pain in his chest and back and feeling short of breath. It continued to get worse to the point that today he could not lie down flat and feels very short of breath so decided to come to emergency room. In ER he was not hypoxic but on CT scan of the chest he was noted to have multifocal pulmonary emboli with right heart strain so ER physician spoke to pulmonologist he suggested to start on heparin IV for now and keep in hospital for observation.  VITAL SIGNS:  Blood pressure 118/63, pulse 75, temperature 99.1 F (37.3 C), temperature source Oral, resp. rate 14, height 6\' 1"  (1.854 m), weight 132 kg (291 lb), SpO2 95 %.  I/O:   Intake/Output Summary (Last 24 hours) at 03/26/17 1314 Last data filed at 03/26/17 0800  Gross per 24 hour  Intake              510 ml  Output                0 ml  Net              510 ml    PHYSICAL EXAMINATION:   GENERAL:  66 y.o.-year-old patient lying in the bed with no acute distress.  EYES: Pupils equal, round, reactive to light and accommodation. No scleral icterus. Extraocular muscles intact.  HEENT: Head atraumatic, normocephalic. Oropharynx and nasopharynx clear.  NECK:  Supple, no jugular venous distention. No thyroid enlargement, no tenderness.  LUNGS: Normal breath sounds bilaterally, no wheezing, rales,rhonchi or crepitation. No use of accessory muscles of respiration.  CARDIOVASCULAR: S1, S2 normal. No murmurs, rubs, or gallops.  ABDOMEN: Soft, nontender, nondistended. Bowel sounds present. No organomegaly or mass.  EXTREMITIES: No pedal edema, cyanosis, or clubbing.  NEUROLOGIC: Cranial nerves II through XII are intact. Muscle strength 5/5 in all extremities. Sensation intact. Gait not checked.  PSYCHIATRIC: The patient is alert and oriented x 3.  SKIN: No obvious rash, lesion, or ulcer.   DATA REVIEW:    CBC  Recent Labs Lab 03/26/17 0356  WBC 11.1*  HGB 13.9  HCT 41.0  PLT 187    Chemistries   Recent Labs Lab 03/24/17 0855  03/26/17 0356  NA 135  < > 136  K 3.9  < > 4.7  CL 102  < > 104  CO2 26  < > 26  GLUCOSE 122*  < > 114*  BUN 15  < > 23*  CREATININE 1.15  < > 1.37*  CALCIUM 8.9  < > 8.7*  AST 17  --   --   ALT 17  --   --   ALKPHOS 59  --   --   BILITOT 1.5*  --   --   < > = values in this interval not displayed.  Cardiac Enzymes  Recent Labs Lab 03/24/17 0855  TROPONINI <0.03  Microbiology Results  No results found for this or any previous visit.  RADIOLOGY:  US Venous Img Lower Bilateral  Result Date: 03/25/2017 CLINICAL DATA:  Bilateral lower extremity pain and swelling for the past week. History of prior DVT and pulmonary embolism. Evaluate for acute or chronic DVT. EXAM: BILATERAL LOWER EXTREMITY VENOUS DOPPLER ULTRASOUND TECHNIQUE: Gray-scale sonography with graded compression, as well as color Doppler and duplex ultrasound were performed to evaluate the lower extremity deep venous systems from the level of the common femoral vein and including the common femoral, femoral, profunda femoral, popliteal and calf veins including the posterior tibial, peroneal and gastrocnemius veins when visible. The superficial great saphenous vein was also interrogated. Spectral Doppler was utilized to evaluate flow at rest and with distal augmentation maneuvers in the common femoral, femoral and popliteal veins. COMPARISON:  Left lower extremity venous Doppler ultrasound - 04/02/2026 FINDINGS: RIGHT LOWER EXTREMITY Common Femoral Vein: No evidence of thrombus. Normal compressibility, respiratory phasicity and response to augmentation. Saphenofemoral Junction: No evidence of thrombus. Normal compressibility and flow on color Doppler imaging. Profunda Femoral Vein: No evidence of thrombus. Normal compressibility and flow on color Doppler imaging. Femoral Vein: No evidence of  thrombus. Normal compressibility, respiratory phasicity and response to augmentation. Popliteal Vein: No evidence of thrombus. Normal compressibility, respiratory phasicity and response to augmentation. Calf Veins: No evidence of thrombus. Normal compressibility and flow on color Doppler imaging. Superficial Great Saphenous Vein: No evidence of thrombus. Normal compressibility and flow on color Doppler imaging. Venous Reflux:  None. Other Findings: Note is made of a non pathologically enlarged right inguinal lymph node which measures approximately 0.7 in greatest short axis diameter (image 47). Note is made of a serpiginous approximately 4.8 x 1.7 x 2.4 cm fluid collection with the right popliteal fossa which is favored to represent a Baker cyst. LEFT LOWER EXTREMITY Common Femoral Vein: No evidence of thrombus. Normal compressibility, respiratory phasicity and response to augmentation. Saphenofemoral Junction: No evidence of thrombus. Normal compressibility and flow on color Doppler imaging. Profunda Femoral Vein: No evidence of thrombus. Normal compressibility and flow on color Doppler imaging. Femoral Vein: No evidence of thrombus. Normal compressibility, respiratory phasicity and response to augmentation. Popliteal Vein: No evidence of thrombus. Normal compressibility, respiratory phasicity and response to augmentation. Calf Veins: Note is made of hypoechoic expansile occlusive thrombus within the left posterior tibial vein (representative image 84) and nonocclusive thrombus within one of the paired left peroneal veins (image 87). Superficial Great Saphenous Vein: No evidence of thrombus. Normal compressibility and flow on color Doppler imaging. Venous Reflux:  None. Other Findings: Note is made of a non pathologically enlarged left inguinal lymph node which measures approximately 0.9 cm in diameter and maintains a benign fatty hilum. IMPRESSION: 1. The examination is positive for mixed occlusive and  nonocclusive distal tibial DVT. There is no extension of this distal calf DVT to the more proximal deep venous system of the left lower extremity. 2. No evidence acute or chronic DVT within the right lower extremity. 3. Incidentally noted approximately 4.8 cm right-sided Baker cyst. Electronically Signed   By: Simonne Come M.D.   On: 03/25/2017 15:31    EKG:   Orders placed or performed during the hospital encounter of 03/24/17  . EKG 12-Lead  . EKG 12-Lead  . EKG      Management plans discussed with the patient, family and they are in agreement.  CODE STATUS:     Code Status Orders  Start     Ordered   03/24/17 1315  Full code  Continuous     03/24/17 1314    Code Status History    Date Active Date Inactive Code Status Order ID Comments User Context   This patient has a current code status but no historical code status.    Advance Directive Documentation     Most Recent Value  Type of Advance Directive  Healthcare Power of Attorney, Living will  Pre-existing out of facility DNR order (yellow form or pink MOST form)  -  "MOST" Form in Place?  -      TOTAL TIME TAKING CARE OF THIS PATIENT: 35 minutes.    Altamese Dilling M.D on 03/26/2017 at 1:14 PM  Between 7am to 6pm - Pager - [phone]  After 6pm go to [email or web address] - password [email or web address]  [facility]  Office  [phone]  CC: Primary care physician; Sherrie Mustache, MD   Note: This dictation was prepared with Dragon dictation along with smaller phrase technology. Any transcriptional errors that result from this process are unintentional.

## 2017-03-27 LAB — PROTHROMBIN GENE MUTATION

## 2017-03-27 MED ORDER — AMOXICILLIN-POT CLAVULANATE 875-125 MG PO TABS
1.0000 | ORAL_TABLET | Freq: Two times a day (BID) | ORAL | Status: DC
  Administered 2017-03-27: 10:00:00 1 via ORAL
  Filled 2017-03-27: qty 1

## 2017-03-27 MED ORDER — AMOXICILLIN-POT CLAVULANATE 875-125 MG PO TABS: 1.0000 | ORAL_TABLET | Freq: Two times a day (BID) | ORAL | 0 refills | Status: DC

## 2017-03-27 NOTE — Discharge Summary (Signed)
[facility] - [place] at [facility]   PATIENT NAME: Jeff Leonard    MR#:  [id number]  DATE OF BIRTH:  03-Aug-1951  DATE OF ADMISSION:  03/24/2017 ADMITTING PHYSICIAN: Altamese Dilling, MD  DATE OF DISCHARGE: 03/27/2017  PRIMARY CARE PHYSICIAN: Sherrie Mustache, MD    ADMISSION DIAGNOSIS:  Shortness of breath [R06.02] Other acute pulmonary embolism without acute cor pulmonale (HCC) [I26.99]  DISCHARGE DIAGNOSIS:  Principal Problem:   Pulmonary embolism (HCC) Active Problems:   Pulmonary emboli (HCC)   SECONDARY DIAGNOSIS:   Past Medical History:  Diagnosis Date  . Glaucoma   . Hx of pulmonary embolus   . Myocardial infarction (HCC)   . Past heart attack     HOSPITAL COURSE:   * Acute pulmonary emboli  on heparin IV drip    Outpatient follow-up with hem oncology for coag workup Checked bilateral lower extremity venous Dopplers shows non occlusive left calf DVT.   I spoke to Dr. Wyn Quaker on phone- he suggest to just cont anticoagulants.   Appreciate pulmonology recommendations  hypercoagulable work ups sent, advise to follow in hematology clinic.  * pneumonia   As per CT chest- there were some infiltrates in lungs.   Will give him augmentin.  *Pleuritic chest pain from pulmonary embolism Tramadol as needed  * aki - ivf , rpt BMP am  * Glaucoma Continue eye drops.  DISCHARGE CONDITIONS:   Stable.  CONSULTS OBTAINED:    DRUG ALLERGIES:  No Known Allergies  DISCHARGE MEDICATIONS:   Current Discharge Medication List    START taking these medications   Details  amoxicillin-clavulanate (AUGMENTIN) 875-125 MG tablet Take 1 tablet by mouth every 12 (twelve) hours. Qty: 10 tablet, Refills: 0    Rivaroxaban 15 & 20 MG TBPK Take as directed on package: Start with one 15mg  tablet by mouth twice a day with food. On Day 22, switch to one 20mg  tablet once a day with food. Qty: 51 each, Refills: 0      CONTINUE these medications  which have NOT CHANGED   Details  Brinzolamide-Brimonidine (SIMBRINZA) 1-0.2 % SUSP INSTILL 1 DROP IN BOTH EYES BID    timolol (TIMOPTIC) 0.25 % ophthalmic solution Apply to eye.    travoprost, benzalkonium, (TRAVATAN) 0.004 % ophthalmic solution Place 1 drop into both eyes at bedtime.    gabapentin (NEURONTIN) 300 MG capsule Take 300 mg by mouth.    HYDROcodone-acetaminophen (NORCO/VICODIN) 5-325 MG tablet Take 1 tablet by mouth 3 (three) times daily as needed for moderate pain.    Vitamin D, Ergocalciferol, (DRISDOL) 50000 units CAPS capsule Take by mouth.         DISCHARGE INSTRUCTIONS:    Follow in cancer center.  If you experience worsening of your admission symptoms, develop shortness of breath, life threatening emergency, suicidal or homicidal thoughts you must seek medical attention immediately by calling 911 or calling your MD immediately  if symptoms less severe.  You Must read complete instructions/literature along with all the possible adverse reactions/side effects for all the Medicines you take and that have been prescribed to you. Take any new Medicines after you have completely understood and accept all the possible adverse reactions/side effects.   Please note  You were cared for by a hospitalist during your hospital stay. If you have any questions about your discharge medications or the care you received while you were in the hospital after you are discharged, you can call the unit and asked to speak with the hospitalist  on call if the hospitalist that took care of you is not available. Once you are discharged, your primary care physician will handle any further medical issues. Please note that NO REFILLS for any discharge medications will be authorized once you are discharged, as it is imperative that you return to your primary care physician (or establish a relationship with a primary care physician if you do not have one) for your aftercare needs so that they can  reassess your need for medications and monitor your lab values.    Today   CHIEF COMPLAINT:   Chief Complaint  Patient presents with  . Shortness of Breath    HISTORY OF PRESENT ILLNESS:  Jeff Leonard  is a 66 y.o. male with a known history of Pulmonary emboli myocardial infarction, was on Xarelto from 2014 to 2016, then stopped. Since yesterday started having pain in his chest and back and feeling short of breath. It continued to get worse to the point that today he could not lie down flat and feels very short of breath so decided to come to emergency room. In ER he was not hypoxic but on CT scan of the chest he was noted to have multifocal pulmonary emboli with right heart strain so ER physician spoke to pulmonologist he suggested to start on heparin IV for now and keep in hospital for observation.  VITAL SIGNS:  Blood pressure 113/68, pulse 72, temperature 98 F (36.7 C), temperature source Oral, resp. rate 16, height 6\' 1"  (1.854 m), weight 132 kg (291 lb), SpO2 99 %.  I/O:    Intake/Output Summary (Last 24 hours) at 03/27/17 1331 Last data filed at 03/27/17 1031  Gross per 24 hour  Intake              480 ml  Output                0 ml  Net              480 ml    PHYSICAL EXAMINATION:   GENERAL:  66 y.o.-year-old patient lying in the bed with no acute distress.  EYES: Pupils equal, round, reactive to light and accommodation. No scleral icterus. Extraocular muscles intact.  HEENT: Head atraumatic, normocephalic. Oropharynx and nasopharynx clear.  NECK:  Supple, no jugular venous distention. No thyroid enlargement, no tenderness.  LUNGS: Normal breath sounds bilaterally, no wheezing, rales,rhonchi or crepitation. No use of accessory muscles of respiration.  CARDIOVASCULAR: S1, S2 normal. No murmurs, rubs, or gallops.  ABDOMEN: Soft, nontender, nondistended. Bowel sounds present. No organomegaly or mass.  EXTREMITIES: b/l pedal edema, no cyanosis, or clubbing.  NEUROLOGIC:  Cranial nerves II through XII are intact. Muscle strength 5/5 in all extremities. Sensation intact. Gait not checked.  PSYCHIATRIC: The patient is alert and oriented x 3.  SKIN: No obvious rash, lesion, or ulcer.   DATA REVIEW:   CBC  Recent Labs Lab 03/26/17 0356  WBC 11.1*  HGB 13.9  HCT 41.0  PLT 187    Chemistries   Recent Labs Lab 03/24/17 0855  03/26/17 0356  NA 135  < > 136  K 3.9  < > 4.7  CL 102  < > 104  CO2 26  < > 26  GLUCOSE 122*  < > 114*  BUN 15  < > 23*  CREATININE 1.15  < > 1.37*  CALCIUM 8.9  < > 8.7*  AST 17  --   --   ALT 17  --   --  ALKPHOS 59  --   --   BILITOT 1.5*  --   --   < > = values in this interval not displayed.  Cardiac Enzymes  Recent Labs Lab 03/24/17 0855  TROPONINI <0.03    Microbiology Results  No results found for this or any previous visit.  RADIOLOGY:  US Venous Img Lower Bilateral  Result Date: 03/25/2017 CLINICAL DATA:  Bilateral lower extremity pain and swelling for the past week. History of prior DVT and pulmonary embolism. Evaluate for acute or chronic DVT. EXAM: BILATERAL LOWER EXTREMITY VENOUS DOPPLER ULTRASOUND TECHNIQUE: Gray-scale sonography with graded compression, as well as color Doppler and duplex ultrasound were performed to evaluate the lower extremity deep venous systems from the level of the common femoral vein and including the common femoral, femoral, profunda femoral, popliteal and calf veins including the posterior tibial, peroneal and gastrocnemius veins when visible. The superficial great saphenous vein was also interrogated. Spectral Doppler was utilized to evaluate flow at rest and with distal augmentation maneuvers in the common femoral, femoral and popliteal veins. COMPARISON:  Left lower extremity venous Doppler ultrasound - 04/02/2026 FINDINGS: RIGHT LOWER EXTREMITY Common Femoral Vein: No evidence of thrombus. Normal compressibility, respiratory phasicity and response to augmentation.  Saphenofemoral Junction: No evidence of thrombus. Normal compressibility and flow on color Doppler imaging. Profunda Femoral Vein: No evidence of thrombus. Normal compressibility and flow on color Doppler imaging. Femoral Vein: No evidence of thrombus. Normal compressibility, respiratory phasicity and response to augmentation. Popliteal Vein: No evidence of thrombus. Normal compressibility, respiratory phasicity and response to augmentation. Calf Veins: No evidence of thrombus. Normal compressibility and flow on color Doppler imaging. Superficial Great Saphenous Vein: No evidence of thrombus. Normal compressibility and flow on color Doppler imaging. Venous Reflux:  None. Other Findings: Note is made of a non pathologically enlarged right inguinal lymph node which measures approximately 0.7 in greatest short axis diameter (image 47). Note is made of a serpiginous approximately 4.8 x 1.7 x 2.4 cm fluid collection with the right popliteal fossa which is favored to represent a Baker cyst. LEFT LOWER EXTREMITY Common Femoral Vein: No evidence of thrombus. Normal compressibility, respiratory phasicity and response to augmentation. Saphenofemoral Junction: No evidence of thrombus. Normal compressibility and flow on color Doppler imaging. Profunda Femoral Vein: No evidence of thrombus. Normal compressibility and flow on color Doppler imaging. Femoral Vein: No evidence of thrombus. Normal compressibility, respiratory phasicity and response to augmentation. Popliteal Vein: No evidence of thrombus. Normal compressibility, respiratory phasicity and response to augmentation. Calf Veins: Note is made of hypoechoic expansile occlusive thrombus within the left posterior tibial vein (representative image 84) and nonocclusive thrombus within one of the paired left peroneal veins (image 87). Superficial Great Saphenous Vein: No evidence of thrombus. Normal compressibility and flow on color Doppler imaging. Venous Reflux:  None. Other  Findings: Note is made of a non pathologically enlarged left inguinal lymph node which measures approximately 0.9 cm in diameter and maintains a benign fatty hilum. IMPRESSION: 1. The examination is positive for mixed occlusive and nonocclusive distal tibial DVT. There is no extension of this distal calf DVT to the more proximal deep venous system of the left lower extremity. 2. No evidence acute or chronic DVT within the right lower extremity. 3. Incidentally noted approximately 4.8 cm right-sided Baker cyst. Electronically Signed   By: Simonne Come M.D.   On: 03/25/2017 15:31    EKG:   Orders placed or performed during the hospital encounter of 03/24/17  .  EKG 12-Lead  . EKG 12-Lead  . EKG    Management plans discussed with the patient, family and they are in agreement.  CODE STATUS:     Code Status Orders        Start     Ordered   03/24/17 1315  Full code  Continuous     03/24/17 1314    Code Status History    Date Active Date Inactive Code Status Order ID Comments User Context   This patient has a current code status but no historical code status.    Advance Directive Documentation     Most Recent Value  Type of Advance Directive  Healthcare Power of Attorney, Living will  Pre-existing out of facility DNR order (yellow form or pink MOST form)  -  "MOST" Form in Place?  -      TOTAL TIME TAKING CARE OF THIS PATIENT: 35 minutes.    Altamese Dilling M.D on 03/27/2017 at 1:31 PM  Between 7am to 6pm - Pager - [phone]  After 6pm go to [email or web address] - password [email or web address]  [facility] [place] [facility]  Office  [phone]  CC: Primary care physician; Sherrie Mustache, MD   Note: This dictation was prepared with Dragon dictation along with smaller phrase technology. Any transcriptional errors that result from this process are unintentional.

## 2017-03-27 NOTE — Progress Notes (Signed)
Pt is being discharged home. Discharge papers given and explained to pt. Pt verbalized understanding. Meds and f/u appointments reviewed with pt. RX given. Pt awaiting transportation.

## 2017-03-27 NOTE — Discharge Instructions (Signed)
Information on my medicine - XARELTO (rivaroxaban)  This medication education was reviewed with me or my healthcare representative as part of my discharge preparation.  The pharmacist that spoke with me during my hospital stay was:  Olene FlossMelissa D Lucresha Dismuke, [facility]  WHY WAS XARELTO PRESCRIBED FOR YOU? Xarelto was prescribed to treat blood clots that may have been found in the veins of your legs (deep vein thrombosis) or in your lungs (pulmonary embolism) and to reduce the risk of them occurring again.  What do you need to know about Xarelto? The starting dose is one 15 mg tablet taken TWICE daily with food for the FIRST 21 DAYS then on (enter date)  6/28  the dose is changed to one 20 mg tablet taken ONCE A DAY with your evening meal.  DO NOT stop taking Xarelto without talking to the health care provider who prescribed the medication.  Refill your prescription for 20 mg tablets before you run out.  After discharge, you should have regular check-up appointments with your healthcare provider that is prescribing your Xarelto.  In the future your dose may need to be changed if your kidney function changes by a significant amount.  What do you do if you miss a dose? If you are taking Xarelto TWICE DAILY and you miss a dose, take it as soon as you remember. You may take two 15 mg tablets (total 30 mg) at the same time then resume your regularly scheduled 15 mg twice daily the next day.  If you are taking Xarelto ONCE DAILY and you miss a dose, take it as soon as you remember on the same day then continue your regularly scheduled once daily regimen the next day. Do not take two doses of Xarelto at the same time.   Important Safety Information Xarelto is a blood thinner medicine that can cause bleeding. You should call your healthcare provider right away if you experience any of the following: ? Bleeding from an injury or your nose that does not stop. ? Unusual colored urine (red or dark brown) or  unusual colored stools (red or black). ? Unusual bruising for unknown reasons. ? A serious fall or if you hit your head (even if there is no bleeding).  Some medicines may interact with Xarelto and might increase your risk of bleeding while on Xarelto. To help avoid this, consult your healthcare provider or pharmacist prior to using any new prescription or non-prescription medications, including herbals, vitamins, non-steroidal anti-inflammatory drugs (NSAIDs) and supplements.  This website has more information on Xarelto: [email or web address].xarelto.[email or web address].

## 2017-03-27 NOTE — Progress Notes (Signed)
SATURATION QUALIFICATIONS: (This note is used to comply with regulatory documentation for home oxygen)  Patient Saturations on Room Air at Rest = 93%  Patient Saturations on Room Air while Ambulating = 90-92%  Patient Saturations on 0 Liters of oxygen while Ambulating = 90-92%  Please briefly explain why patient needs home oxygen:

## 2017-03-27 NOTE — Progress Notes (Signed)
PT Cancellation Note  Patient Details Name: Jeff Leonard MRN: [id number] DOB: 19-May-1951   Cancelled Treatment:    Reason Eval/Treat Not Completed: Other (comment).  Pt has been d/c home.     Encarnacion ChuAshley Abashian PT, DPT 03/27/2017, 4:32 PM

## 2017-03-27 NOTE — Progress Notes (Signed)
[facility] [place] [facility]   PATIENT NAME: Jeff Leonard    MR#:  [id number]  DATE OF BIRTH:  10/18/1951  SUBJECTIVE:  CHIEF COMPLAINT:  Felt better this morning, so plan was to d/c home. But in evening again complained of chest tightness and SOB, so canceled discharge.  REVIEW OF SYSTEMS:  CONSTITUTIONAL: No fever, fatigue or weakness.  EYES: No blurred or double vision.  EARS, NOSE, AND THROAT: No tinnitus or ear pain.  RESPIRATORY: No cough, shortness of breath, wheezing or hemoptysis.  CARDIOVASCULAR: Reports chest pain with deep breathing.  GASTROINTESTINAL: No nausea, vomiting, diarrhea or abdominal pain.  GENITOURINARY: No dysuria, hematuria.  ENDOCRINE: No polyuria, nocturia,  HEMATOLOGY: No anemia, easy bruising or bleeding SKIN: No rash or lesion. MUSCULOSKELETAL: No joint pain or arthritis.   NEUROLOGIC: No tingling, numbness, weakness.  PSYCHIATRY: No anxiety or depression.   DRUG ALLERGIES:  No Known Allergies  VITALS:  Blood pressure 122/71, pulse 75, temperature 98.2 F (36.8 C), temperature source Oral, resp. rate 18, height 6\' 1"  (1.854 m), weight 132 kg (291 lb), SpO2 100 %.  PHYSICAL EXAMINATION:  GENERAL:  66 y.o.-year-old patient lying in the bed with no acute distress.  EYES: Pupils equal, round, reactive to light and accommodation. No scleral icterus. Extraocular muscles intact.  HEENT: Head atraumatic, normocephalic. Oropharynx and nasopharynx clear.  NECK:  Supple, no jugular venous distention. No thyroid enlargement, no tenderness.  LUNGS: Normal breath sounds bilaterally, no wheezing, rales,rhonchi or crepitation. No use of accessory muscles of respiration.  CARDIOVASCULAR: S1, S2 normal. No murmurs, rubs, or gallops.  ABDOMEN: Soft, nontender, nondistended. Bowel sounds present. No organomegaly or mass.  EXTREMITIES: No pedal edema, cyanosis, or clubbing.  NEUROLOGIC: Cranial nerves II through XII are intact. Muscle  strength 5/5 in all extremities. Sensation intact. Gait not checked.  PSYCHIATRIC: The patient is alert and oriented x 3.  SKIN: No obvious rash, lesion, or ulcer.    LABORATORY PANEL:   CBC  Recent Labs Lab 03/26/17 0356  WBC 11.1*  HGB 13.9  HCT 41.0  PLT 187   ------------------------------------------------------------------------------------------------------------------  Chemistries   Recent Labs Lab 03/24/17 0855  03/26/17 0356  NA 135  < > 136  K 3.9  < > 4.7  CL 102  < > 104  CO2 26  < > 26  GLUCOSE 122*  < > 114*  BUN 15  < > 23*  CREATININE 1.15  < > 1.37*  CALCIUM 8.9  < > 8.7*  AST 17  --   --   ALT 17  --   --   ALKPHOS 59  --   --   BILITOT 1.5*  --   --   < > = values in this interval not displayed. ------------------------------------------------------------------------------------------------------------------  Cardiac Enzymes  Recent Labs Lab 03/24/17 0855  TROPONINI <0.03   ------------------------------------------------------------------------------------------------------------------  RADIOLOGY:  US Venous Img Lower Bilateral  Result Date: 03/25/2017 CLINICAL DATA:  Bilateral lower extremity pain and swelling for the past week. History of prior DVT and pulmonary embolism. Evaluate for acute or chronic DVT. EXAM: BILATERAL LOWER EXTREMITY VENOUS DOPPLER ULTRASOUND TECHNIQUE: Gray-scale sonography with graded compression, as well as color Doppler and duplex ultrasound were performed to evaluate the lower extremity deep venous systems from the level of the common femoral vein and including the common femoral, femoral, profunda femoral, popliteal and calf veins including the posterior tibial, peroneal and gastrocnemius veins when visible. The superficial great saphenous vein was also  interrogated. Spectral Doppler was utilized to evaluate flow at rest and with distal augmentation maneuvers in the common femoral, femoral and popliteal veins.  COMPARISON:  Left lower extremity venous Doppler ultrasound - 04/02/2026 FINDINGS: RIGHT LOWER EXTREMITY Common Femoral Vein: No evidence of thrombus. Normal compressibility, respiratory phasicity and response to augmentation. Saphenofemoral Junction: No evidence of thrombus. Normal compressibility and flow on color Doppler imaging. Profunda Femoral Vein: No evidence of thrombus. Normal compressibility and flow on color Doppler imaging. Femoral Vein: No evidence of thrombus. Normal compressibility, respiratory phasicity and response to augmentation. Popliteal Vein: No evidence of thrombus. Normal compressibility, respiratory phasicity and response to augmentation. Calf Veins: No evidence of thrombus. Normal compressibility and flow on color Doppler imaging. Superficial Great Saphenous Vein: No evidence of thrombus. Normal compressibility and flow on color Doppler imaging. Venous Reflux:  None. Other Findings: Note is made of a non pathologically enlarged right inguinal lymph node which measures approximately 0.7 in greatest short axis diameter (image 47). Note is made of a serpiginous approximately 4.8 x 1.7 x 2.4 cm fluid collection with the right popliteal fossa which is favored to represent a Baker cyst. LEFT LOWER EXTREMITY Common Femoral Vein: No evidence of thrombus. Normal compressibility, respiratory phasicity and response to augmentation. Saphenofemoral Junction: No evidence of thrombus. Normal compressibility and flow on color Doppler imaging. Profunda Femoral Vein: No evidence of thrombus. Normal compressibility and flow on color Doppler imaging. Femoral Vein: No evidence of thrombus. Normal compressibility, respiratory phasicity and response to augmentation. Popliteal Vein: No evidence of thrombus. Normal compressibility, respiratory phasicity and response to augmentation. Calf Veins: Note is made of hypoechoic expansile occlusive thrombus within the left posterior tibial vein (representative image 84)  and nonocclusive thrombus within one of the paired left peroneal veins (image 87). Superficial Great Saphenous Vein: No evidence of thrombus. Normal compressibility and flow on color Doppler imaging. Venous Reflux:  None. Other Findings: Note is made of a non pathologically enlarged left inguinal lymph node which measures approximately 0.9 cm in diameter and maintains a benign fatty hilum. IMPRESSION: 1. The examination is positive for mixed occlusive and nonocclusive distal tibial DVT. There is no extension of this distal calf DVT to the more proximal deep venous system of the left lower extremity. 2. No evidence acute or chronic DVT within the right lower extremity. 3. Incidentally noted approximately 4.8 cm right-sided Baker cyst. Electronically Signed   By: Simonne ComeJohn  Watts M.D.   On: 03/25/2017 15:31    EKG:   Orders placed or performed during the hospital encounter of 03/24/17  . EKG 12-Lead  . EKG 12-Lead  . EKG    ASSESSMENT AND PLAN:   * Acute pulmonary emboli    on heparin IV drip  Echocardiogram reviewed- right heart strain.  start him on Xarelto , he needs to be anticoagulated lifetime  as this is a second episode of pulmonary embolism Outpatient follow-up with hem oncology for coag workup   Checked bilateral lower extremity venous Dopplers - have left calf non occlusive thrombus, spoke to vascular- suggest to treat with anticoagulants. Appreciate pulmonology recommendations  *Pleuritic chest pain from pulmonary embolism Tramadol as needed  * aki - ivf , rpt BMP am  * Glaucoma   Continue eye drops.    All the records are reviewed and case discussed with Care Management/Social Workerr. Management plans discussed with the patient, family and they are in agreement.  CODE STATUS: fc   TOTAL TIME TAKING CARE OF THIS PATIENT: 35  minutes.  POSSIBLE D/C IN 1-2 DAYS, DEPENDING ON CLINICAL CONDITION.  Note: This dictation was prepared with Dragon dictation along with smaller  phrase technology. Any transcriptional errors that result from this process are unintentional.   Altamese Dilling M.D on 03/27/2017 at 7:33 AM  Between 7am to 6pm - Pager - [phone] After 6pm go to [email or web address] - password [email or web address] [facility]  [facility]  Office  [phone]  CC: Primary care physician; Sherrie Mustache, MD

## 2017-03-28 LAB — FACTOR 5 LEIDEN

## 2017-03-31 ENCOUNTER — Emergency Department: Payer: Medicare PPO

## 2017-03-31 ENCOUNTER — Observation Stay
Admission: EM | Admit: 2017-03-31 | Discharge: 2017-04-02 | Disposition: A | Discharge status: Home / Self Care | Payer: Medicare PPO | Attending: Internal Medicine | Admitting: Internal Medicine

## 2017-03-31 DIAGNOSIS — I252 Old myocardial infarction: Secondary | ICD-10-CM | POA: Diagnosis not present

## 2017-03-31 DIAGNOSIS — Z86711 Personal history of pulmonary embolism: Secondary | ICD-10-CM | POA: Insufficient documentation

## 2017-03-31 DIAGNOSIS — R0602 Shortness of breath: Secondary | ICD-10-CM

## 2017-03-31 DIAGNOSIS — I2609 Other pulmonary embolism with acute cor pulmonale: Secondary | ICD-10-CM | POA: Diagnosis not present

## 2017-03-31 DIAGNOSIS — I2699 Other pulmonary embolism without acute cor pulmonale: Secondary | ICD-10-CM

## 2017-03-31 DIAGNOSIS — J90 Pleural effusion, not elsewhere classified: Secondary | ICD-10-CM | POA: Insufficient documentation

## 2017-03-31 DIAGNOSIS — G629 Polyneuropathy, unspecified: Secondary | ICD-10-CM | POA: Insufficient documentation

## 2017-03-31 DIAGNOSIS — Z79899 Other long term (current) drug therapy: Secondary | ICD-10-CM | POA: Diagnosis not present

## 2017-03-31 DIAGNOSIS — H409 Unspecified glaucoma: Secondary | ICD-10-CM | POA: Insufficient documentation

## 2017-03-31 DIAGNOSIS — Z7902 Long term (current) use of antithrombotics/antiplatelets: Secondary | ICD-10-CM | POA: Insufficient documentation

## 2017-03-31 DIAGNOSIS — R042 Hemoptysis: Secondary | ICD-10-CM | POA: Diagnosis present

## 2017-03-31 LAB — BASIC METABOLIC PANEL
Anion gap: 9 (ref 5–15)
BUN: 18 mg/dL (ref 6–20)
CO2: 26 mmol/L (ref 22–32)
CREATININE: 1.63 mg/dL — AB (ref 0.61–1.24)
Calcium: 9.6 mg/dL (ref 8.9–10.3)
Chloride: 102 mmol/L (ref 101–111)
GFR calc Af Amer: 50 mL/min — ABNORMAL LOW (ref >60)
GFR, EST NON AFRICAN AMERICAN: 43 mL/min — AB (ref >60)
GLUCOSE: 96 mg/dL (ref 65–99)
Potassium: 4.4 mmol/L (ref 3.5–5.1)
SODIUM: 137 mmol/L (ref 135–145)

## 2017-03-31 LAB — CBC
HCT: 41.2 % (ref 40.0–52.0)
Hemoglobin: 14.5 g/dL (ref 13.0–18.0)
MCH: 33.4 pg (ref 26.0–34.0)
MCHC: 35.2 g/dL (ref 32.0–36.0)
MCV: 94.9 fL (ref 80.0–100.0)
PLATELETS: 343 10*3/uL (ref 150–440)
RBC: 4.34 MIL/uL — ABNORMAL LOW (ref 4.40–5.90)
RDW: 13.5 % (ref 11.5–14.5)
WBC: 10.6 10*3/uL (ref 3.8–10.6)

## 2017-03-31 LAB — TROPONIN I: Troponin I: <0.03 ng/mL (ref <0.03)

## 2017-03-31 NOTE — ED Notes (Signed)
Pt to Xray.

## 2017-03-31 NOTE — ED Triage Notes (Signed)
Pt present with shortness of breath chest tightness coughing up blood reports discharge from hospital on Friday for PE. Pt gets short of breath with exertion and pauses several times to talk

## 2017-04-01 ENCOUNTER — Emergency Department: Payer: Medicare PPO

## 2017-04-01 ENCOUNTER — Encounter: Payer: Self-pay | Admitting: Radiology

## 2017-04-01 DIAGNOSIS — R042 Hemoptysis: Secondary | ICD-10-CM | POA: Diagnosis not present

## 2017-04-01 DIAGNOSIS — I2699 Other pulmonary embolism without acute cor pulmonale: Secondary | ICD-10-CM

## 2017-04-01 LAB — CBC
HEMATOCRIT: 37.3 % — AB (ref 40.0–52.0)
HEMOGLOBIN: 12.6 g/dL — AB (ref 13.0–18.0)
MCH: 32.3 pg (ref 26.0–34.0)
MCHC: 33.7 g/dL (ref 32.0–36.0)
MCV: 95.8 fL (ref 80.0–100.0)
Platelets: 327 10*3/uL (ref 150–440)
RBC: 3.9 MIL/uL — ABNORMAL LOW (ref 4.40–5.90)
RDW: 13.3 % (ref 11.5–14.5)
WBC: 11.6 10*3/uL — AB (ref 3.8–10.6)

## 2017-04-01 LAB — BASIC METABOLIC PANEL
ANION GAP: 8 (ref 5–15)
BUN: 18 mg/dL (ref 6–20)
CHLORIDE: 104 mmol/L (ref 101–111)
CO2: 26 mmol/L (ref 22–32)
Calcium: 9.1 mg/dL (ref 8.9–10.3)
Creatinine, Ser: 1.45 mg/dL — ABNORMAL HIGH (ref 0.61–1.24)
GFR calc Af Amer: 57 mL/min — ABNORMAL LOW (ref >60)
GFR calc non Af Amer: 49 mL/min — ABNORMAL LOW (ref >60)
Glucose, Bld: 121 mg/dL — ABNORMAL HIGH (ref 65–99)
POTASSIUM: 4.4 mmol/L (ref 3.5–5.1)
SODIUM: 138 mmol/L (ref 135–145)

## 2017-04-01 MED ORDER — TRAVOPROST 0.004 % OP SOLN: 1.0000 [drp] | Freq: Every day | OPHTHALMIC | Status: DC

## 2017-04-01 MED ORDER — ONDANSETRON HCL 4 MG/2ML IJ SOLN: 4.0000 mg | Freq: Four times a day (QID) | INTRAMUSCULAR | Status: DC | PRN

## 2017-04-01 MED ORDER — SODIUM CHLORIDE 0.9 % IV SOLN
INTRAVENOUS | Status: DC
  Administered 2017-04-01: 04:00:00 via INTRAVENOUS

## 2017-04-01 MED ORDER — MOMETASONE FURO-FORMOTEROL FUM 100-5 MCG/ACT IN AERO
2.0000 | INHALATION_SPRAY | Freq: Two times a day (BID) | RESPIRATORY_TRACT | Status: DC
  Administered 2017-04-01 – 2017-04-02 (×2): 2 via RESPIRATORY_TRACT
  Filled 2017-04-01: qty 8.8

## 2017-04-01 MED ORDER — GABAPENTIN 300 MG PO CAPS
600.0000 mg | ORAL_CAPSULE | Freq: Three times a day (TID) | ORAL | Status: DC
  Administered 2017-04-01 – 2017-04-02 (×4): 600 mg via ORAL
  Filled 2017-04-01 (×4): qty 2

## 2017-04-01 MED ORDER — ACETAMINOPHEN 650 MG RE SUPP: 650.0000 mg | Freq: Four times a day (QID) | RECTAL | Status: DC | PRN

## 2017-04-01 MED ORDER — HYDROCODONE-ACETAMINOPHEN 5-325 MG PO TABS: 1.0000 | ORAL_TABLET | Freq: Three times a day (TID) | ORAL | Status: DC | PRN

## 2017-04-01 MED ORDER — IOPAMIDOL (ISOVUE-370) INJECTION 76%: 60.0000 mL | Freq: Once | INTRAVENOUS | Status: DC | PRN

## 2017-04-01 MED ORDER — NON FORMULARY: 1.0000 [drp] | Freq: Two times a day (BID) | Status: DC

## 2017-04-01 MED ORDER — BRINZOLAMIDE 1 % OP SUSP
1.0000 [drp] | Freq: Two times a day (BID) | OPHTHALMIC | Status: DC
  Administered 2017-04-01 – 2017-04-02 (×3): 1 [drp] via OPHTHALMIC
  Filled 2017-04-01: qty 10

## 2017-04-01 MED ORDER — LATANOPROST 0.005 % OP SOLN
1.0000 [drp] | Freq: Every day | OPHTHALMIC | Status: DC
  Administered 2017-04-01: 1 [drp] via OPHTHALMIC
  Filled 2017-04-01: qty 2.5

## 2017-04-01 MED ORDER — ONDANSETRON HCL 4 MG PO TABS: 4.0000 mg | ORAL_TABLET | Freq: Four times a day (QID) | ORAL | Status: DC | PRN

## 2017-04-01 MED ORDER — METHYLPREDNISOLONE SODIUM SUCC 40 MG IJ SOLR
40.0000 mg | Freq: Every day | INTRAMUSCULAR | Status: DC
  Administered 2017-04-01: 40 mg via INTRAVENOUS
  Filled 2017-04-01: qty 1

## 2017-04-01 MED ORDER — IOPAMIDOL (ISOVUE-370) INJECTION 76%
75.0000 mL | Freq: Once | INTRAVENOUS | Status: AC | PRN
  Administered 2017-04-01: 75 mL via INTRAVENOUS

## 2017-04-01 MED ORDER — TIMOLOL MALEATE 0.5 % OP SOLN
1.0000 [drp] | Freq: Two times a day (BID) | OPHTHALMIC | Status: DC
  Administered 2017-04-01 – 2017-04-02 (×3): 1 [drp] via OPHTHALMIC
  Filled 2017-04-01: qty 5

## 2017-04-01 MED ORDER — BRIMONIDINE TARTRATE 0.2 % OP SOLN
1.0000 [drp] | Freq: Two times a day (BID) | OPHTHALMIC | Status: DC
  Administered 2017-04-01 – 2017-04-02 (×3): 1 [drp] via OPHTHALMIC
  Filled 2017-04-01: qty 5

## 2017-04-01 MED ORDER — LATANOPROST 0.005 % OP SOLN
1.0000 [drp] | Freq: Every day | OPHTHALMIC | Status: DC
  Filled 2017-04-01: qty 2.5

## 2017-04-01 MED ORDER — TIMOLOL MALEATE 0.25 % OP SOLN
1.0000 [drp] | Freq: Every day | OPHTHALMIC | Status: DC
  Administered 2017-04-01: 1 [drp] via OPHTHALMIC
  Filled 2017-04-01: qty 5

## 2017-04-01 MED ORDER — ACETAMINOPHEN 325 MG PO TABS: 650.0000 mg | ORAL_TABLET | Freq: Four times a day (QID) | ORAL | Status: DC | PRN

## 2017-04-01 MED ORDER — IPRATROPIUM-ALBUTEROL 0.5-2.5 (3) MG/3ML IN SOLN
3.0000 mL | Freq: Four times a day (QID) | RESPIRATORY_TRACT | Status: DC
  Administered 2017-04-01 – 2017-04-02 (×5): 3 mL via RESPIRATORY_TRACT
  Filled 2017-04-01 (×4): qty 3

## 2017-04-01 MED ORDER — AMOXICILLIN-POT CLAVULANATE 875-125 MG PO TABS
1.0000 | ORAL_TABLET | Freq: Two times a day (BID) | ORAL | Status: DC
  Administered 2017-04-01 – 2017-04-02 (×3): 1 via ORAL
  Filled 2017-04-01 (×3): qty 1

## 2017-04-01 MED ORDER — BUDESONIDE 0.5 MG/2ML IN SUSP
0.5000 mg | Freq: Two times a day (BID) | RESPIRATORY_TRACT | Status: DC
  Administered 2017-04-01 – 2017-04-02 (×2): 0.5 mg via RESPIRATORY_TRACT
  Filled 2017-04-01: qty 2

## 2017-04-01 MED ORDER — RIVAROXABAN 15 MG PO TABS
15.0000 mg | ORAL_TABLET | Freq: Two times a day (BID) | ORAL | Status: DC
  Administered 2017-04-01 – 2017-04-02 (×3): 15 mg via ORAL
  Filled 2017-04-01 (×3): qty 1

## 2017-04-01 NOTE — Progress Notes (Signed)
Patient ID: Jeff Leonard, male   DOB: [birth year], 66 y.o.   MRN: [id number]  [facility] PROGRESS NOTE  Jeff Leonard FAO:[id number] DOB: May 12, 1951 DOA: 03/31/2017 PCP: Sherrie Mustache, MD  HPI/Subjective: Patient came into the ER with 3 episodes of coughing up a little blood. Patient was recently diagnosed with pulmonary embolism with pulmonary infarct. Was seen by pulmonary at that time.  Objective: Vitals:   04/01/17 0754 04/01/17 1132  BP: 108/74 115/70  Pulse: 72 73  Resp: 18 20  Temp: 98.4 F (36.9 C) 97.8 F (36.6 C)    Filed Weights   03/31/17 2202  Weight: 132 kg (291 lb)    ROS: Review of Systems  Constitutional: Negative for chills and fever.  Eyes: Negative for blurred vision.  Respiratory: Positive for cough, hemoptysis, shortness of breath and wheezing.   Cardiovascular: Negative for chest pain.  Gastrointestinal: Negative for abdominal pain, constipation, diarrhea, nausea and vomiting.  Genitourinary: Negative for dysuria.  Musculoskeletal: Negative for joint pain.  Neurological: Negative for dizziness and headaches.   Exam: Physical Exam  Constitutional: He is oriented to person, place, and time.  HENT:  Nose: No mucosal edema.  Mouth/Throat: No oropharyngeal exudate or posterior oropharyngeal edema.  Eyes: Conjunctivae, EOM and lids are normal. Pupils are equal, round, and reactive to light.  Neck: No JVD present. Carotid bruit is not present. No edema present. No thyroid mass and no thyromegaly present.  Cardiovascular: S1 normal and S2 normal.  Exam reveals no gallop.   No murmur heard. Pulses:      Dorsalis pedis pulses are 2+ on the right side, and 2+ on the left side.  Respiratory: No respiratory distress. He has decreased breath sounds in the right upper field, the right middle field, the right lower field, the left upper field, the left middle field and the left lower field. He has wheezes in the right upper field and the left upper field. He  has rhonchi in the right lower field and the left lower field. He has no rales.  GI: Soft. Bowel sounds are normal. There is no tenderness.  Musculoskeletal:       Right ankle: He exhibits swelling.       Left ankle: He exhibits swelling.  Lymphadenopathy:    He has no cervical adenopathy.  Neurological: He is alert and oriented to person, place, and time. No cranial nerve deficit.  Skin: Skin is warm. No rash noted. Nails show no clubbing.  Psychiatric: He has a normal mood and affect.      Data Reviewed: Basic Metabolic Panel:  Recent Labs Lab 03/26/17 0356 03/31/17 2321 04/01/17 0421  NA 136 137 138  K 4.7 4.4 4.4  CL 104 102 104  CO2 26 26 26   GLUCOSE 114* 96 121*  BUN 23* 18 18  CREATININE 1.37* 1.63* 1.45*  CALCIUM 8.7* 9.6 9.1   CBC:  Recent Labs Lab 03/26/17 0356 03/31/17 2321 04/01/17 0421  WBC 11.1* 10.6 11.6*  HGB 13.9 14.5 12.6*  HCT 41.0 41.2 37.3*  MCV 95.6 94.9 95.8  PLT 187 343 327   Cardiac Enzymes:  Recent Labs Lab 03/31/17 2321  TROPONINI <0.03     Studies: Dg Chest 2 View  Result Date: 03/31/2017 CLINICAL DATA:  Shortness of breath, hemoptysis. Recent diagnosis of pulmonary embolism. Evaluate chest tightness. EXAM: CHEST  2 VIEW COMPARISON:  CT chest March 24, 2017 FINDINGS: Cardiomediastinal silhouette is normal. Small RIGHT pleural effusion with bibasilar strandy densities. Mild bronchitic changes. No  pneumothorax. Soft tissue planes and included osseous structures are nonsuspicious. IMPRESSION: Similar small RIGHT pleural effusion with bibasilar atelectasis and bronchitic changes. Electronically Signed   By: Awilda Metroourtnay  Bloomer M.D.   On: 03/31/2017 22:31   Ct Angio Chest Pe W Or Wo Contrast  Result Date: 04/01/2017 CLINICAL DATA:  Acute onset of shortness of breath and generalized chest tightness. Hemoptysis. Initial encounter. EXAM: CT ANGIOGRAPHY CHEST WITH CONTRAST TECHNIQUE: Multidetector CT imaging of the chest was performed using  the standard protocol during bolus administration of intravenous contrast. Multiplanar CT image reconstructions and MIPs were obtained to evaluate the vascular anatomy. CONTRAST:  75 mL of Isovue 370 IV contrast COMPARISON:  CTA of the chest performed 03/24/2017, and chest radiograph performed 03/31/2017 FINDINGS: Cardiovascular: Pulmonary emboli are again noted within the pulmonary arteries to the right upper and lower lobes, and at the left main pulmonary artery. This is slightly improved from the prior study. Underlying right heart strain is again noted. The heart remains normal in size. The thoracic aorta is grossly unremarkable. The great vessels are within normal limits. Mediastinum/Nodes: Visualized subcarinal and azygoesophageal recess nodes remain borderline normal in size. No pericardial effusion is identified. The visualized portions of the thyroid gland are grossly unremarkable. No axillary lymphadenopathy is appreciated. Lungs/Pleura: A small right pleural effusion is noted, increased in size from the prior study. Underlying pulmonary infarct at the right lung base is somewhat worsened, and superimposed pneumonia cannot be excluded. The left lung appears relatively clear. No pneumothorax is seen. Upper Abdomen: The liver and spleen are unremarkable in appearance. The gallbladder is grossly unremarkable in appearance. The visualized portions of the pancreas and adrenal glands are within normal limits. Mild left renal atrophy is noted, with left renal cysts. Musculoskeletal: No acute osseous abnormalities are identified. The visualized musculature is unremarkable in appearance. Review of the MIP images confirms the above findings. IMPRESSION: 1. Pulmonary emboli again noted within the pulmonary arteries to the right upper and lower lobes, and at the left main pulmonary artery. Extent of pulmonary emboli is slightly improved from the prior study. Underlying right heart strain again noted. 2. Small right  pleural effusion has increased in size from the prior study. Underlying pulmonary infarct at the right lung base is somewhat worsened, and superimposed pneumonia cannot be excluded. 3. Mild left renal atrophy, with left renal cysts. These results were called by telephone at the time of interpretation on 04/01/2017 at 2:05 am to Dr. Lucrezia EuropeALLISON WEBSTER, who verbally acknowledged these results. Electronically Signed   By: Roanna RaiderJeffery  Chang M.D.   On: 04/01/2017 02:06    Scheduled Meds: . amoxicillin-clavulanate  1 tablet Oral Q12H  . brinzolamide  1 drop Both Eyes BID   And  . brimonidine  1 drop Both Eyes BID  . budesonide (PULMICORT) nebulizer solution  0.5 mg Nebulization BID  . gabapentin  600 mg Oral TID  . ipratropium-albuterol  3 mL Nebulization Q6H  . latanoprost  1 drop Both Eyes QHS  . latanoprost  1 drop Both Eyes QHS  . methylPREDNISolone (SOLU-MEDROL) injection  40 mg Intravenous Daily  . Rivaroxaban  15 mg Oral BID  . timolol  1 drop Both Eyes Daily  . timolol  1 drop Both Eyes BID   :  Assessment/Plan:  1. Hemoptysis. Small amount. Await pulmonary consultation. Case discussed with Dr. Ardyth Manam pulmonary. 2. Recent diagnosis of pulmonary embolism with pulmonary infarct. Restart Xaralto. 3. Possible pneumonia finish up course of Augmentin. 4. Poor air entry. Start  Solu-Medrol and nebulizer treatments. 5. Glaucoma continue eyedrops 6. Neuropathy on gabapentin  Code Status:     Code Status Orders        Start     Ordered   04/01/17 0419  Full code  Continuous     04/01/17 0419    Code Status History    Date Active Date Inactive Code Status Order ID Comments User Context   03/24/2017  1:14 PM 03/27/2017  8:29 PM Full Code [id number]  Altamese Dilling, MD Inpatient    Advance Directive Documentation     Most Recent Value  Type of Advance Directive  Living will, Healthcare Power of Attorney  Pre-existing out of facility DNR order (yellow form or pink MOST form)  -  "MOST"  Form in Place?  -     Family Communication: Family at bedside Disposition Plan: Watch for further signs of hemoptysis. Will need to move better air prior to disposition  Consultants:  Pulmonary  Antibiotics:  Augmentin  Time spent: 38 minutes  Alford Highland  [employer]

## 2017-04-01 NOTE — Consult Note (Signed)
Name: Jeff Leonard MRN: [id number] DOB: Oct 30, 1950     CONSULTATION DATE: 03/31/2017  REFERRING MD :  Pyreddy  CHIEF COMPLAINT:    STUDIES:  CT of the chest shows bilateral pulmonary embolism along with a right-sided pleural effusion along with her right sided lower lobe opacification Ultrasound of his lower extremities is positive for nonocclusive DVT   HISTORY OF PRESENT ILLNESS:    Jeff Leonard  is a 66 y.o. male with a known history of Pulmonary embolism, glaucoma, myocardial infarction presented to the emergency room for coughing of blood.  -Patient was recently diagnosed with pulmonary embolus was in last week and started on oral xarelto for anticoagulation. - Yesterday he coughed up blood and he had 3 episodes of coughing of blood.  -The quantity of blood coughed up for slight 20-30 mL.  -Patient presented to the emergency room and was evaluated with a CT chest which showed pulmonary embolism but improved from prior CAT scan.  -He took oral Xarelto yesterday according to the scheduled dosage.  -No complaints of any chest pain. Has an episode of shortness of breath when he coughed up of blood. No fever or chills.   There is no evidence of infection at this time First Episode of PE was in 2014 and was on Xarelto and was taken off in 2017 Patient with acute SOB 1 week ago and then dx with PE and DVT and placed On xarelto  Patient states he feels much better with oxygen therapy and BD therapy  PAST MEDICAL HISTORY :   has a past medical history of Glaucoma; pulmonary embolus; Myocardial infarction [facility](HCC); and Past heart attack.  has a past surgical history that includes Shoulder surgery. Prior to Admission medications   Medication Sig Start Date End Date Taking? Authorizing Provider  amoxicillin-clavulanate (AUGMENTIN) 875-125 MG tablet Take 1 tablet by mouth every 12 (twelve) hours. 03/27/17 04/01/17 Yes Altamese DillingVachhani, Vaibhavkumar, MD  Brinzolamide-Brimonidine [facility](SIMBRINZA) 1-0.2 %  SUSP INSTILL 1 DROP IN BOTH EYES BID 07/13/15  Yes [provider]  ergocalciferol (VITAMIN D2) 50000 units capsule Take 1 capsule by mouth once a week.   Yes [provider]  gabapentin (NEURONTIN) 300 MG capsule Take 300 mg by mouth. 12/21/14  Yes [provider]  HYDROcodone-acetaminophen (NORCO/VICODIN) 5-325 MG tablet Take 1 tablet by mouth 3 (three) times daily as needed for moderate pain.   Yes [provider]  latanoprost (XALATAN) 0.005 % ophthalmic solution PLACE 1 DROP INTO BOTH EYES TWICE A DAY 02/13/17  Yes [provider]  Rivaroxaban 15 & 20 MG TBPK Take as directed on package: Start with one 15mg  tablet by mouth twice a day with food. On Day 22, switch to one 20mg  tablet once a day with food. 03/26/17  Yes Altamese DillingVachhani, Vaibhavkumar, MD  timolol (TIMOPTIC) 0.5 % ophthalmic solution INSTILL 1 DROP INTO BOTH EYES EVERY MORNING 02/14/17  Yes [provider]  travoprost, benzalkonium, (TRAVATAN) 0.004 % ophthalmic solution Place 1 drop into both eyes at bedtime.   Yes [provider]   No Known Allergies  FAMILY HISTORY:  family history includes Asthma in his maternal grandfather; Cancer in his paternal uncle and paternal uncle; Heart disease (age of onset: 2361) in his father; Hypertension in his mother. SOCIAL HISTORY:  reports that he has never smoked. He has never used smokeless tobacco. He reports that he does not drink alcohol or use drugs.  REVIEW OF SYSTEMS:   Constitutional: Negative for fever, chills, weight loss, malaise/fatigue and  diaphoresis.  HENT: Negative for hearing loss, ear pain, nosebleeds, congestion, sore throat, neck pain, tinnitus and ear discharge.   Eyes: Negative for blurred vision, double vision, photophobia, pain, discharge and redness.  Respiratory: +hemoptysis, -sputum production,+ shortness of breath, -wheezing and -stridor.   Cardiovascular: Negative for chest pain, palpitations, orthopnea,  claudication, leg swelling and PND.  Gastrointestinal: Negative for heartburn, nausea, vomiting, abdominal pain, diarrhea, constipation, blood in stool and melena.  Genitourinary: Negative for dysuria, urgency, frequency, hematuria and flank pain.  Musculoskeletal: Negative for myalgias, back pain, joint pain and falls.  Skin: Negative for itching and rash.  Neurological: Negative for dizziness, tingling, tremors, sensory change, speech change, focal weakness, seizures, loss of consciousness, weakness and headaches.  Endo/Heme/Allergies: Negative for environmental allergies and polydipsia. Does not bruise/bleed easily.  ALL OTHER ROS ARE NEGATIVE    VITAL SIGNS: Temp:  [97.8 F (36.6 C)-98.4 F (36.9 C)] 97.8 F (36.6 C) (06/12 1132) Pulse Rate:  [67-85] 73 (06/12 1132) Resp:  [18-35] 20 (06/12 1132) BP: (107-123)/(64-83) 115/70 (06/12 1132) SpO2:  [94 %-100 %] 94 % (06/12 1447) FiO2 (%):  [28 %] 28 % (06/12 1447) Weight:  [291 lb (132 kg)] 291 lb (132 kg) (06/11 2202)  Physical Examination:   GENERAL:NAD, no fevers, chills, no weakness no fatigue HEAD: Normocephalic, atraumatic.  EYES: Pupils equal, round, reactive to light. Extraocular muscles intact. No scleral icterus.  MOUTH: Moist mucosal membrane.   EAR, NOSE, THROAT: Clear without exudates. No external lesions.  NECK: Supple. No thyromegaly. No nodules. No JVD.  PULMONARY:CTA B/L no wheezes, no crackles, no rhonchi CARDIOVASCULAR: S1 and S2. Regular rate and rhythm. No murmurs, rubs, or gallops. No edema.  GASTROINTESTINAL: Soft, nontender, nondistended. No masses. Positive bowel sounds.  MUSCULOSKELETAL: No swelling, clubbing, or edema. Range of motion full in all extremities.  NEUROLOGIC: Cranial nerves II through XII are intact. No gross focal neurological deficits.  SKIN: No ulceration, lesions, rashes, or cyanosis. Skin warm and dry. Turgor intact.  PSYCHIATRIC: Mood, affect within normal limits. The patient is  awake, alert and oriented x 3. Insight, judgment intact.       Recent Labs Lab 03/26/17 0356 03/31/17 2321 04/01/17 0421  NA 136 137 138  K 4.7 4.4 4.4  CL 104 102 104  CO2 26 26 26   BUN 23* 18 18  CREATININE 1.37* 1.63* 1.45*  GLUCOSE 114* 96 121*    Recent Labs Lab 03/26/17 0356 03/31/17 2321 04/01/17 0421  HGB 13.9 14.5 12.6*  HCT 41.0 41.2 37.3*  WBC 11.1* 10.6 11.6*  PLT 187 343 327   Dg Chest 2 View  Result Date: 03/31/2017 CLINICAL DATA:  Shortness of breath, hemoptysis. Recent diagnosis of pulmonary embolism. Evaluate chest tightness. EXAM: CHEST  2 VIEW COMPARISON:  CT chest March 24, 2017 FINDINGS: Cardiomediastinal silhouette is normal. Small RIGHT pleural effusion with bibasilar strandy densities. Mild bronchitic changes. No pneumothorax. Soft tissue planes and included osseous structures are nonsuspicious. IMPRESSION: Similar small RIGHT pleural effusion with bibasilar atelectasis and bronchitic changes. Electronically Signed   By: Awilda Metro M.D.   On: 03/31/2017 22:31   Ct Angio Chest Pe W Or Wo Contrast  Result Date: 04/01/2017 CLINICAL DATA:  Acute onset of shortness of breath and generalized chest tightness. Hemoptysis. Initial encounter. EXAM: CT ANGIOGRAPHY CHEST WITH CONTRAST TECHNIQUE: Multidetector CT imaging of the chest was performed using the standard protocol during bolus administration of intravenous contrast. Multiplanar CT image reconstructions and MIPs were obtained to evaluate the vascular  anatomy. CONTRAST:  75 mL of Isovue 370 IV contrast COMPARISON:  CTA of the chest performed 03/24/2017, and chest radiograph performed 03/31/2017 FINDINGS: Cardiovascular: Pulmonary emboli are again noted within the pulmonary arteries to the right upper and lower lobes, and at the left main pulmonary artery. This is slightly improved from the prior study. Underlying right heart strain is again noted. The heart remains normal in size. The thoracic aorta is  grossly unremarkable. The great vessels are within normal limits. Mediastinum/Nodes: Visualized subcarinal and azygoesophageal recess nodes remain borderline normal in size. No pericardial effusion is identified. The visualized portions of the thyroid gland are grossly unremarkable. No axillary lymphadenopathy is appreciated. Lungs/Pleura: A small right pleural effusion is noted, increased in size from the prior study. Underlying pulmonary infarct at the right lung base is somewhat worsened, and superimposed pneumonia cannot be excluded. The left lung appears relatively clear. No pneumothorax is seen. Upper Abdomen: The liver and spleen are unremarkable in appearance. The gallbladder is grossly unremarkable in appearance. The visualized portions of the pancreas and adrenal glands are within normal limits. Mild left renal atrophy is noted, with left renal cysts. Musculoskeletal: No acute osseous abnormalities are identified. The visualized musculature is unremarkable in appearance. Review of the MIP images confirms the above findings. IMPRESSION: 1. Pulmonary emboli again noted within the pulmonary arteries to the right upper and lower lobes, and at the left main pulmonary artery. Extent of pulmonary emboli is slightly improved from the prior study. Underlying right heart strain again noted. 2. Small right pleural effusion has increased in size from the prior study. Underlying pulmonary infarct at the right lung base is somewhat worsened, and superimposed pneumonia cannot be excluded. 3. Mild left renal atrophy, with left renal cysts. These results were called by telephone at the time of interpretation on 04/01/2017 at 2:05 am to Dr. Lucrezia Europe, who verbally acknowledged these results. Electronically Signed   By: Roanna Raider M.D.   On: 04/01/2017 02:06   I have Independently reviewed images of CT chest from today   on 04/01/2017 Interpretation:b/l PE, RLL effusion, RLL opacity  ASSESSMENT /  PLAN: 66 year old male admitted to the hospital for submassive hemoptysis in the setting of a diagnosis of bilateral pulmonary embolism with underlying nonocclusive DVT in the lower extremities  His submassive hemoptysis could be coming from acute BRonchitis  If patient does not tolerate oral anticoagulation and hemoptysis is massive, he will be a candidate for IVC filter  #1 continue oral anticoagulation but if patient cannot tolerate this he will need vascular surgery consult for IVC filter placement #2 oxygen as needed #3 no indication for Bronch or systemic steroids at this time #4 will start LABA/IC therapy with [facility]  Patient/Family are satisfied with Plan of action and management. All questions answered  Lucie Leather, M.D.  [facility]  Medical Director [facility] Medical Director [facility] Cardio-Pulmonary Department

## 2017-04-01 NOTE — H&P (Signed)
[facility] Physicians - [place] at [facility]   PATIENT NAME: Jeff Leonard    MR#:  [id number]  DATE OF BIRTH:  May 29, 1951  DATE OF ADMISSION:  03/31/2017  PRIMARY CARE PHYSICIAN: Sherrie Mustache, MD   REQUESTING/REFERRING PHYSICIAN:   CHIEF COMPLAINT:   Chief Complaint  Patient presents with  . Cough  . Shortness of Breath  . Hemoptysis    Cough     HISTORY OF PRESENT ILLNESS: Jeff Leonard  is a 66 y.o. male with a known history of Pulmonary embolism, glaucoma, myocardial infarction presented to the emergency room for coughing of blood. Patient was recently diagnosed with pulmonary was in last week and started on oral xarelto for anticoagulation. Yesterday he coughed up blood and he had 3 episodes of coughing of blood. The quantity of blood coughed up for slight 20-30 mL. Patient presented to the emergency room and was evaluated with a CT chest which showed pulmonary embolism but improved from prior CAT scan. He took oral Xarelto yesterday according to the scheduled dosage. No complaints of any chest pain. Has an episode of shortness of breath when he coughed up of blood. No fever or chills. CT chest which showed pulmonary infarct. Hospitalist service was consulted for further care of the patient. No complaints of any vomiting of blood and rectal bleed.  PAST MEDICAL HISTORY:   Past Medical History:  Diagnosis Date  . Glaucoma   . Hx of pulmonary embolus   . Myocardial infarction (HCC)   . Past heart attack     PAST SURGICAL HISTORY: Past Surgical History:  Procedure Laterality Date  . SHOULDER SURGERY     left shoulder     SOCIAL HISTORY:  Social History  Substance Use Topics  . Smoking status: Never Smoker  . Smokeless tobacco: Never Used  . Alcohol use No    FAMILY HISTORY:  Family History  Problem Relation Age of Onset  . Heart disease Father 11  . Hypertension Mother   . Asthma Maternal Grandfather   . Cancer Paternal Uncle        lung  .  Cancer Paternal Uncle     DRUG ALLERGIES: No Known Allergies  REVIEW OF SYSTEMS:   CONSTITUTIONAL: No fever, fatigue or weakness.  EYES: No blurred or double vision.  EARS, NOSE, AND THROAT: No tinnitus or ear pain.  RESPIRATORY: Has cough, shortness of breath, hemoptysis.  No wheezing CARDIOVASCULAR: No chest pain, orthopnea, edema.  GASTROINTESTINAL: No nausea, vomiting, diarrhea or abdominal pain.  GENITOURINARY: No dysuria, hematuria.  ENDOCRINE: No polyuria, nocturia,  HEMATOLOGY: No anemia, easy bruising or bleeding SKIN: No rash or lesion. MUSCULOSKELETAL: No joint pain or arthritis.   NEUROLOGIC: No tingling, numbness, weakness.  PSYCHIATRY: No anxiety or depression.   MEDICATIONS AT HOME:  Prior to Admission medications   Medication Sig Start Date End Date Taking? Authorizing Provider  amoxicillin-clavulanate (AUGMENTIN) 875-125 MG tablet Take 1 tablet by mouth every 12 (twelve) hours. 03/27/17 04/01/17  Altamese Dilling, MD  Brinzolamide-Brimonidine [facility]) 1-0.2 % SUSP INSTILL 1 DROP IN BOTH EYES BID 07/13/15   [provider]  gabapentin (NEURONTIN) 300 MG capsule Take 300 mg by mouth. 12/21/14   [provider]  HYDROcodone-acetaminophen (NORCO/VICODIN) 5-325 MG tablet Take 1 tablet by mouth 3 (three) times daily as needed for moderate pain.    [provider]  Rivaroxaban 15 & 20 MG TBPK Take as directed on package: Start with one 15mg  tablet by mouth twice a day with  food. On Day 22, switch to one 20mg  tablet once a day with food. 03/26/17   Altamese Dilling, MD  timolol (TIMOPTIC) 0.25 % ophthalmic solution Apply to eye.    [provider]  travoprost, benzalkonium, (TRAVATAN) 0.004 % ophthalmic solution Place 1 drop into both eyes at bedtime.    [provider]  Vitamin D, Ergocalciferol, (DRISDOL) 50000 units CAPS capsule Take by mouth.    [provider]      PHYSICAL EXAMINATION:   VITAL SIGNS:  Blood pressure 113/70, pulse 71, temperature 97.9 F (36.6 C), temperature source Oral, resp. rate (!) 25, height 6\' 1"  (1.854 m), weight 132 kg (291 lb), SpO2 97 %.  GENERAL:  66 y.o.-year-old patient lying in the bed with no acute distress.  EYES: Pupils equal, round, reactive to light and accommodation. No scleral icterus. Extraocular muscles intact.  HEENT: Head atraumatic, normocephalic. Oropharynx and nasopharynx clear.  NECK:  Supple, no jugular venous distention. No thyroid enlargement, no tenderness.  LUNGS: Decreased breath sounds bilaterally, no wheezing, rales,rhonchi or crepitation. No use of accessory muscles of respiration.  CARDIOVASCULAR: S1, S2 normal. No murmurs, rubs, or gallops.  ABDOMEN: Soft, nontender, nondistended. Bowel sounds present. No organomegaly or mass.  EXTREMITIES: No pedal edema, cyanosis, or clubbing.  NEUROLOGIC: Cranial nerves II through XII are intact. Muscle strength 5/5 in all extremities. Sensation intact. Gait not checked.  PSYCHIATRIC: The patient is alert and oriented x 3.  SKIN: No obvious rash, lesion, or ulcer.   LABORATORY PANEL:   CBC  Recent Labs Lab 03/26/17 0356 03/31/17 2321  WBC 11.1* 10.6  HGB 13.9 14.5  HCT 41.0 41.2  PLT 187 343  MCV 95.6 94.9  MCH 32.4 33.4  MCHC 33.8 35.2  RDW 13.7 13.5   ------------------------------------------------------------------------------------------------------------------  Chemistries   Recent Labs Lab 03/26/17 0356 03/31/17 2321  NA 136 137  K 4.7 4.4  CL 104 102  CO2 26 26  GLUCOSE 114* 96  BUN 23* 18  CREATININE 1.37* 1.63*  CALCIUM 8.7* 9.6   ------------------------------------------------------------------------------------------------------------------ estimated creatinine clearance is 65.2 mL/min (A) (by C-G formula based on SCr of 1.63 mg/dL (H)). ------------------------------------------------------------------------------------------------------------------ No  results for input(s): TSH, T4TOTAL, T3FREE, THYROIDAB in the last 72 hours.  Invalid input(s): FREET3   Coagulation profile No results for input(s): INR, PROTIME in the last 168 hours. ------------------------------------------------------------------------------------------------------------------- No results for input(s): DDIMER in the last 72 hours. -------------------------------------------------------------------------------------------------------------------  Cardiac Enzymes  Recent Labs Lab 03/31/17 2321  TROPONINI <0.03   ------------------------------------------------------------------------------------------------------------------ Invalid input(s): POCBNP  ---------------------------------------------------------------------------------------------------------------  Urinalysis    Component Value Date/Time   COLORURINE YELLOW (A) 03/24/2017 1438   APPEARANCEUR CLEAR (A) 03/24/2017 1438   LABSPEC 1.040 (H) 03/24/2017 1438   PHURINE 5.0 03/24/2017 1438   GLUCOSEU NEGATIVE 03/24/2017 1438   HGBUR NEGATIVE 03/24/2017 1438   BILIRUBINUR NEGATIVE 03/24/2017 1438   KETONESUR NEGATIVE 03/24/2017 1438   PROTEINUR NEGATIVE 03/24/2017 1438   NITRITE NEGATIVE 03/24/2017 1438   LEUKOCYTESUR NEGATIVE 03/24/2017 1438     RADIOLOGY: Dg Chest 2 View  Result Date: 03/31/2017 CLINICAL DATA:  Shortness of breath, hemoptysis. Recent diagnosis of pulmonary embolism. Evaluate chest tightness. EXAM: CHEST  2 VIEW COMPARISON:  CT chest March 24, 2017 FINDINGS: Cardiomediastinal silhouette is normal. Small RIGHT pleural effusion with bibasilar strandy densities. Mild bronchitic changes. No pneumothorax. Soft tissue planes and included osseous structures are nonsuspicious. IMPRESSION: Similar small RIGHT pleural effusion with bibasilar atelectasis and bronchitic changes. Electronically Signed   By: Michel Santee.D.  On: 03/31/2017 22:31   Ct Angio Chest Pe W Or Wo  Contrast  Result Date: 04/01/2017 CLINICAL DATA:  Acute onset of shortness of breath and generalized chest tightness. Hemoptysis. Initial encounter. EXAM: CT ANGIOGRAPHY CHEST WITH CONTRAST TECHNIQUE: Multidetector CT imaging of the chest was performed using the standard protocol during bolus administration of intravenous contrast. Multiplanar CT image reconstructions and MIPs were obtained to evaluate the vascular anatomy. CONTRAST:  75 mL of Isovue 370 IV contrast COMPARISON:  CTA of the chest performed 03/24/2017, and chest radiograph performed 03/31/2017 FINDINGS: Cardiovascular: Pulmonary emboli are again noted within the pulmonary arteries to the right upper and lower lobes, and at the left main pulmonary artery. This is slightly improved from the prior study. Underlying right heart strain is again noted. The heart remains normal in size. The thoracic aorta is grossly unremarkable. The great vessels are within normal limits. Mediastinum/Nodes: Visualized subcarinal and azygoesophageal recess nodes remain borderline normal in size. No pericardial effusion is identified. The visualized portions of the thyroid gland are grossly unremarkable. No axillary lymphadenopathy is appreciated. Lungs/Pleura: A small right pleural effusion is noted, increased in size from the prior study. Underlying pulmonary infarct at the right lung base is somewhat worsened, and superimposed pneumonia cannot be excluded. The left lung appears relatively clear. No pneumothorax is seen. Upper Abdomen: The liver and spleen are unremarkable in appearance. The gallbladder is grossly unremarkable in appearance. The visualized portions of the pancreas and adrenal glands are within normal limits. Mild left renal atrophy is noted, with left renal cysts. Musculoskeletal: No acute osseous abnormalities are identified. The visualized musculature is unremarkable in appearance. Review of the MIP images confirms the above findings. IMPRESSION: 1.  Pulmonary emboli again noted within the pulmonary arteries to the right upper and lower lobes, and at the left main pulmonary artery. Extent of pulmonary emboli is slightly improved from the prior study. Underlying right heart strain again noted. 2. Small right pleural effusion has increased in size from the prior study. Underlying pulmonary infarct at the right lung base is somewhat worsened, and superimposed pneumonia cannot be excluded. 3. Mild left renal atrophy, with left renal cysts. These results were called by telephone at the time of interpretation on 04/01/2017 at 2:05 am to Dr. Lucrezia Europe, who verbally acknowledged these results. Electronically Signed   By: Roanna Raider M.D.   On: 04/01/2017 02:06    EKG: Orders placed or performed during the hospital encounter of 03/31/17  . ED EKG within 10 minutes  . ED EKG within 10 minutes    IMPRESSION AND PLAN: 66 year old male patient with history of pulmonary embolism, glaucoma, myocardial infarction presented to the emergency room with coughing of blood. Admitting diagnosis 1. Hemoptysis 2. Glaucoma 3. Pulmonary embolism 4. Dyspnea Treatment plan Admit patient to telemetry Hold result of for anti-coagulation for now Pulmonology consultation for further recommendation if patient will need Bronchoscopy Monitor hemoglobin and hematocrit Oxygen via nasal cannula Gentle IV fluid hydration Keep patient nothing by mouth DVT prophylaxis with sequential compression device to lower extremities Supportive care  All the records are reviewed and case discussed with ED provider. Management plans discussed with the patient, family and they are in agreement.  CODE STATUS:FULL CODE Code Status History    Date Active Date Inactive Code Status Order ID Comments User Context   03/24/2017  1:14 PM 03/27/2017  8:29 PM Full Code [id number]  Altamese Dilling, MD Inpatient    Advance Directive Documentation     Most  Recent Value  Type of  Advance Directive  Healthcare Power of Attorney  Pre-existing out of facility DNR order (yellow form or pink MOST form)  -  "MOST" Form in Place?  -       TOTAL TIME TAKING CARE OF THIS PATIENT: 50 minutes.    Ihor AustinPavan Pyreddy M.D on 04/01/2017 at 2:43 AM  Between 7am to 6pm - Pager - [phone]  After 6pm go to [email or web address] - password [email or web address] [facility]  [facility]  Office  [phone]  CC: Primary care physician; Sherrie MustacheJadali, Fayegh, MD

## 2017-04-01 NOTE — Care Management (Signed)
[facility]arel[facility] for pulmonary embolus and dvt.  Returns a and readmitted 6/12 for hemoptysis and shortness of breath requiring supplemental oxygen. Pulmonary consulted.  If patient does not tolerate anticoagulation- may need to consider ivc filter. Discussed the need to assess for home 02 during progression.  At present, there is not a chronic cardiac/respiratory diagnosis that would qualify patient for oxygen under medicare criteria.

## 2017-04-01 NOTE — ED Provider Notes (Signed)
[facility] Emergency Department Provider Note   ____________________________________________   First MD Initiated Contact with Patient 03/31/17 2322     (approximate)  I have reviewed the triage vital signs and the nursing notes.   HISTORY  Chief Complaint Cough; Shortness of Breath; and Hemoptysis (Cough )    HPI Jeff Leonard is a 66 y.o. male who comes into the hospital today coughing up blood. The patient was seen and admitted to the hospital. He was discharged on Thursday after being diagnosed with a pulmonary embolus. He reports that the coughing up of blood started today. He's had 3 episodes where he coughed up a few clots of blood. He has had some shortness of breath. He reports that that since his diagnosis. At times it is better and at times it is worse. The doctor told him to take some time to get better but he is not sure exactly what's going on. The patient has had a pulmonary embolus back in 2014. He states that they never found out why he had it. The patient was started on Xarelto and he states that he has been taking his medication. The patient has some chest tightness on the right side and up into his back. It feels like lightening. He is supposed to see Dr. Orpah Clinton and Dr. Orlie Dakin. The patient is here for evaluation.The patient rates his pain a 10 out of 10 in intensity.   Past Medical History:  Diagnosis Date  . Glaucoma   . Hx of pulmonary embolus   . Myocardial infarction (HCC)   . Past heart attack     Patient Active Problem List   Diagnosis Date Noted  . Hemoptysis 04/01/2017  . Pulmonary embolism (HCC) 03/24/2017  . Pulmonary emboli (HCC) 03/24/2017  . Morbid obesity (HCC) 05/04/2014  . DVT (deep venous thrombosis) (HCC) 05/04/2014  . Shortness of breath 03/21/2014  . Pulmonary embolism 03/21/2014    Past Surgical History:  Procedure Laterality Date  . SHOULDER SURGERY     left shoulder     Prior to Admission  medications   Medication Sig Start Date End Date Taking? Authorizing Provider  amoxicillin-clavulanate (AUGMENTIN) 875-125 MG tablet Take 1 tablet by mouth every 12 (twelve) hours. 03/27/17 04/01/17 Yes Altamese Dilling, MD  Brinzolamide-Brimonidine [facility]) 1-0.2 % SUSP INSTILL 1 DROP IN BOTH EYES BID 07/13/15  Yes [provider]  ergocalciferol (VITAMIN D2) 50000 units capsule Take 1 capsule by mouth once a week.   Yes [provider]  gabapentin (NEURONTIN) 300 MG capsule Take 300 mg by mouth. 12/21/14  Yes [provider]  HYDROcodone-acetaminophen (NORCO/VICODIN) 5-325 MG tablet Take 1 tablet by mouth 3 (three) times daily as needed for moderate pain.   Yes [provider]  latanoprost (XALATAN) 0.005 % ophthalmic solution PLACE 1 DROP INTO BOTH EYES TWICE A DAY 02/13/17  Yes [provider]  Rivaroxaban 15 & 20 MG TBPK Take as directed on package: Start with one 15mg  tablet by mouth twice a day with food. On Day 22, switch to one 20mg  tablet once a day with food. 03/26/17  Yes Altamese Dilling, MD  timolol (TIMOPTIC) 0.5 % ophthalmic solution INSTILL 1 DROP INTO BOTH EYES EVERY MORNING 02/14/17  Yes [provider]  travoprost, benzalkonium, (TRAVATAN) 0.004 % ophthalmic solution Place 1 drop into both eyes at bedtime.   Yes [provider]    Allergies Patient has no known allergies.  Family History  Problem Relation Age of Onset  .  Heart disease Father 69  . Hypertension Mother   . Asthma Maternal Grandfather   . Cancer Paternal Uncle        lung  . Cancer Paternal Uncle     Social History Social History  Substance Use Topics  . Smoking status: Never Smoker  . Smokeless tobacco: Never Used  . Alcohol use No    Review of Systems  Constitutional: No fever/chills Eyes: No visual changes. ENT: No sore throat. Cardiovascular: chest pain. Respiratory:  shortness of breath Coughing up  blood Gastrointestinal: No abdominal pain.  No nausea, no vomiting.  No diarrhea.  No constipation. Genitourinary: Negative for dysuria. Musculoskeletal: Negative for back pain. Skin: Negative for rash. Neurological: Negative for headaches, focal weakness or numbness.  ____________________________________________   PHYSICAL EXAM:  VITAL SIGNS: ED Triage Vitals  Enc Vitals Group     BP 03/31/17 2159 113/71     Pulse Rate 03/31/17 2159 85     Resp 03/31/17 2159 (!) 24     Temp 03/31/17 2159 97.9 F (36.6 C)     Temp Source 03/31/17 2159 Oral     SpO2 03/31/17 2159 94 %     Weight 03/31/17 2202 291 lb (132 kg)     Height 03/31/17 2202 6\' 1"  (1.854 m)     Head Circumference --      Peak Flow --      Pain Score 03/31/17 2158 10     Pain Loc --      Pain Edu? --      Excl. in GC? --     Constitutional: Alert and oriented. Well appearing and in no acute distress. Eyes: Conjunctivae are normal. PERRL. EOMI. Head: Atraumatic. Nose: No congestion/rhinnorhea. Mouth/Throat: Mucous membranes are moist.  Oropharynx non-erythematous. Cardiovascular: Normal rate, regular rhythm. Grossly normal heart sounds.  Good peripheral circulation. Respiratory: Increased respiratory effort, with tachypnea No retractions. Lungs CTAB. Patient speaking in 2 word sentences Gastrointestinal: Soft and nontender. No distention. Positive bowel sounds Musculoskeletal: No lower extremity tenderness nor edema.   Neurologic:  Normal speech and language. No gross focal neurologic deficits are appreciated.  Skin:  Skin is warm, dry and intact. No rash noted. Psychiatric: Mood and affect are normal.   ____________________________________________   LABS (all labs ordered are listed, but only abnormal results are displayed)  Labs Reviewed  BASIC METABOLIC PANEL - Abnormal; Notable for the following:       Result Value   Creatinine, Ser 1.63 (*)    GFR calc non Af Amer 43 (*)    GFR calc Af Amer 50 (*)     All other components within normal limits  CBC - Abnormal; Notable for the following:    RBC 4.34 (*)    All other components within normal limits  BLOOD GAS, VENOUS - Abnormal; Notable for the following:    Acid-Base Excess 2.5 (*)    All other components within normal limits  BASIC METABOLIC PANEL - Abnormal; Notable for the following:    Glucose, Bld 121 (*)    Creatinine, Ser 1.45 (*)    GFR calc non Af Amer 49 (*)    GFR calc Af Amer 57 (*)    All other components within normal limits  CBC - Abnormal; Notable for the following:    WBC 11.6 (*)    RBC 3.90 (*)    Hemoglobin 12.6 (*)    HCT 37.3 (*)    All other components within normal limits  TROPONIN I  ____________________________________________  EKG  ED ECG REPORT I, Rebecka Apley, the attending physician, personally viewed and interpreted this ECG.   Date: 03/31/2017  EKG Time: 2212  Rate: 85  Rhythm: normal sinus rhythm  Axis: left axis deviation  Intervals:none  ST&T Change: none  ____________________________________________  RADIOLOGY  Dg Chest 2 View  Result Date: 03/31/2017 CLINICAL DATA:  Shortness of breath, hemoptysis. Recent diagnosis of pulmonary embolism. Evaluate chest tightness. EXAM: CHEST  2 VIEW COMPARISON:  CT chest March 24, 2017 FINDINGS: Cardiomediastinal silhouette is normal. Small RIGHT pleural effusion with bibasilar strandy densities. Mild bronchitic changes. No pneumothorax. Soft tissue planes and included osseous structures are nonsuspicious. IMPRESSION: Similar small RIGHT pleural effusion with bibasilar atelectasis and bronchitic changes. Electronically Signed   By: Awilda Metro M.D.   On: 03/31/2017 22:31   Ct Angio Chest Pe W Or Wo Contrast  Result Date: 04/01/2017 CLINICAL DATA:  Acute onset of shortness of breath and generalized chest tightness. Hemoptysis. Initial encounter. EXAM: CT ANGIOGRAPHY CHEST WITH CONTRAST TECHNIQUE: Multidetector CT imaging of the chest was  performed using the standard protocol during bolus administration of intravenous contrast. Multiplanar CT image reconstructions and MIPs were obtained to evaluate the vascular anatomy. CONTRAST:  75 mL of Isovue 370 IV contrast COMPARISON:  CTA of the chest performed 03/24/2017, and chest radiograph performed 03/31/2017 FINDINGS: Cardiovascular: Pulmonary emboli are again noted within the pulmonary arteries to the right upper and lower lobes, and at the left main pulmonary artery. This is slightly improved from the prior study. Underlying right heart strain is again noted. The heart remains normal in size. The thoracic aorta is grossly unremarkable. The great vessels are within normal limits. Mediastinum/Nodes: Visualized subcarinal and azygoesophageal recess nodes remain borderline normal in size. No pericardial effusion is identified. The visualized portions of the thyroid gland are grossly unremarkable. No axillary lymphadenopathy is appreciated. Lungs/Pleura: A small right pleural effusion is noted, increased in size from the prior study. Underlying pulmonary infarct at the right lung base is somewhat worsened, and superimposed pneumonia cannot be excluded. The left lung appears relatively clear. No pneumothorax is seen. Upper Abdomen: The liver and spleen are unremarkable in appearance. The gallbladder is grossly unremarkable in appearance. The visualized portions of the pancreas and adrenal glands are within normal limits. Mild left renal atrophy is noted, with left renal cysts. Musculoskeletal: No acute osseous abnormalities are identified. The visualized musculature is unremarkable in appearance. Review of the MIP images confirms the above findings. IMPRESSION: 1. Pulmonary emboli again noted within the pulmonary arteries to the right upper and lower lobes, and at the left main pulmonary artery. Extent of pulmonary emboli is slightly improved from the prior study. Underlying right heart strain again noted.  2. Small right pleural effusion has increased in size from the prior study. Underlying pulmonary infarct at the right lung base is somewhat worsened, and superimposed pneumonia cannot be excluded. 3. Mild left renal atrophy, with left renal cysts. These results were called by telephone at the time of interpretation on 04/01/2017 at 2:05 am to Dr. Lucrezia Europe, who verbally acknowledged these results. Electronically Signed   By: Roanna Raider M.D.   On: 04/01/2017 02:06    ____________________________________________   PROCEDURES  Procedure(s) performed: None  Procedures  Critical Care performed: No  ____________________________________________   INITIAL IMPRESSION / ASSESSMENT AND PLAN / ED COURSE  Pertinent labs & imaging results that were available during my care of the patient were reviewed by me and considered  in my medical decision making (see chart for details).  This is a 66 year old male who comes into the hospital today with some hemoptysis. The patient was diagnosed with a pulmonary embolus but has been having some persistent tachypnea and developed some hemoptysis today. The patient did have a chest x-ray which showed a small right pleural effusion but I did repeat the patient's CT scan. The concern is that he may be having worsening PE. The patient's clot burden appears to have decreased on the CT scan but he has a worsened infarct and worsening pleural effusion. Given the patient's tachypnea I will admit him back to the hospitalist service. The patient was placed on O2 which did help some of his shortness of breath.  Clinical Course as of Apr 01 645  Mon Mar 31, 2017  2316 Similar small RIGHT pleural effusion with bibasilar atelectasis and bronchitic changes.   DG Chest 2 View [AW]    Clinical Course User Index [AW] Rebecka ApleyWebster, Shail Urbas P, MD     ____________________________________________   FINAL CLINICAL IMPRESSION(S) / ED DIAGNOSES  Final diagnoses:  SOB  (shortness of breath)  Hemoptysis  Other acute pulmonary embolism with acute cor pulmonale (HCC)  Pulmonary infarct (HCC)  Pleural effusion      NEW MEDICATIONS STARTED DURING THIS VISIT:  Current Discharge Medication List       Note:  This document was prepared using Dragon voice recognition software and may include unintentional dictation errors.    Rebecka ApleyWebster, Deen Deguia P, MD 04/01/17 [phone]

## 2017-04-01 NOTE — ED Notes (Signed)
Patient transported to CT 

## 2017-04-02 LAB — BLOOD GAS, VENOUS
Acid-Base Excess: 2.5 mmol/L — ABNORMAL HIGH (ref 0.0–2.0)
BICARBONATE: 27.9 mmol/L (ref 20.0–28.0)
PATIENT TEMPERATURE: 37
PCO2 VEN: 45 mmHg (ref 44.0–60.0)
PH VEN: 7.4 (ref 7.250–7.430)

## 2017-04-02 MED ORDER — PREDNISONE 20 MG PO TABS
30.0000 mg | ORAL_TABLET | Freq: Every day | ORAL | Status: DC
  Administered 2017-04-02: 30 mg via ORAL
  Filled 2017-04-02: qty 1

## 2017-04-02 MED ORDER — PREDNISONE 10 MG PO TABS: ORAL_TABLET | ORAL | 0 refills | Status: DC

## 2017-04-02 MED ORDER — AMOXICILLIN-POT CLAVULANATE 875-125 MG PO TABS: 1.0000 | ORAL_TABLET | Freq: Two times a day (BID) | ORAL | 0 refills | Status: DC

## 2017-04-02 MED ORDER — ALBUTEROL SULFATE HFA 108 (90 BASE) MCG/ACT IN AERS: 2.0000 | INHALATION_SPRAY | Freq: Four times a day (QID) | RESPIRATORY_TRACT | 0 refills | Status: AC | PRN

## 2017-04-02 MED ORDER — MOMETASONE FURO-FORMOTEROL FUM 100-5 MCG/ACT IN AERO: 2.0000 | INHALATION_SPRAY | Freq: Two times a day (BID) | RESPIRATORY_TRACT | 0 refills | Status: DC

## 2017-04-02 NOTE — Discharge Summary (Signed)
[employer] [facility] [place] [facility]   PATIENT NAME: Jeff Leonard    MR#:  [id number]  DATE OF BIRTH:  [birth year]  DATE OF ADMISSION:  03/31/2017 ADMITTING PHYSICIAN: Ihor Austin, MD  DATE OF DISCHARGE: 04/02/2017  3:55 PM  PRIMARY CARE PHYSICIAN: Sherrie Mustache, MD    ADMISSION DIAGNOSIS:  Pulmonary infarct (HCC) [I26.99] SOB (shortness of breath) [R06.02] Pleural effusion [J90] Hemoptysis [R04.2] Other acute pulmonary embolism with acute cor pulmonale (HCC) [I26.09]  DISCHARGE DIAGNOSIS:  Active Problems:   Hemoptysis   SECONDARY DIAGNOSIS:   Past Medical History:  Diagnosis Date  . Glaucoma   . Hx of pulmonary embolus   . Myocardial infarction (HCC)   . Past heart attack     HOSPITAL COURSE:   1. Hemoptysis. Patient was admitting with coughing up 3 phlegm filled blood episodes. Seen by pulmonary. No plans for bronchoscopy. Hemoptysis was submassive and likely due to pneumonia and poor air entry. 2. Recent diagnosis of pulmonary embolism with pulmonary infarct. I restarted Xaralto. This needs to be continued. 3. Possible pneumonia. Finish up his course of Augmentin. 4. Poor air entry. I started Solu-Medrol and will give a quick prednisone taper. I started nebulizer treatments patient feeling much better. Patient placed on albuterol inhaler and do N Heller to go home with. 5. Glaucoma continue eyedrops 6. Neuropathy on gabapentin  DISCHARGE CONDITIONS:   Satisfactory  CONSULTS OBTAINED:  Treatment Team:  Shane Crutch, MD  DRUG ALLERGIES:  No Known Allergies  DISCHARGE MEDICATIONS:   Discharge Medication List as of 04/02/2017  2:25 PM    START taking these medications   Details  albuterol (PROVENTIL HFA;VENTOLIN HFA) 108 (90 Base) MCG/ACT inhaler Inhale 2 puffs into the lungs every 6 (six) hours as needed for wheezing or shortness of breath., Starting Wed 04/02/2017, Print    mometasone-formoterol (DULERA) 100-5 MCG/ACT AERO Inhale  2 puffs into the lungs 2 (two) times daily., Starting Wed 04/02/2017, Print    predniSONE (DELTASONE) 10 MG tablet 2 tabs po day1; 1 tab po day2,3 then stop, Print      CONTINUE these medications which have CHANGED   Details  amoxicillin-clavulanate (AUGMENTIN) 875-125 MG tablet Take 1 tablet by mouth every 12 (twelve) hours., Starting Wed 04/02/2017, Until Mon 04/07/2017, No Print      CONTINUE these medications which have NOT CHANGED   Details  Brinzolamide-Brimonidine (SIMBRINZA) 1-0.2 % SUSP INSTILL 1 DROP IN BOTH EYES BID, Historical Med    ergocalciferol (VITAMIN D2) 50000 units capsule Take 1 capsule by mouth once a week., Historical Med    gabapentin (NEURONTIN) 300 MG capsule Take 300 mg by mouth., Starting Wed 12/21/2014, Historical Med    HYDROcodone-acetaminophen (NORCO/VICODIN) 5-325 MG tablet Take 1 tablet by mouth 3 (three) times daily as needed for moderate pain., Historical Med    latanoprost (XALATAN) 0.005 % ophthalmic solution PLACE 1 DROP INTO BOTH EYES TWICE A DAY, Historical Med    Rivaroxaban 15 & 20 MG TBPK Take as directed on package: Start with one 15mg  tablet by mouth twice a day with food. On Day 22, switch to one 20mg  tablet once a day with food., Normal    timolol (TIMOPTIC) 0.5 % ophthalmic solution INSTILL 1 DROP INTO BOTH EYES EVERY MORNING, Historical Med      STOP taking these medications     travoprost, benzalkonium, (TRAVATAN) 0.004 % ophthalmic solution      timolol (TIMOPTIC) 0.25 % ophthalmic solution  DISCHARGE INSTRUCTIONS:   Follow-up PMD one week Keep appointment with hematology Keep appointment with cardiology Schedule follow-up appointment with pulmonology  If you experience worsening of your admission symptoms, develop shortness of breath, life threatening emergency, suicidal or homicidal thoughts you must seek medical attention immediately by calling 911 or calling your MD immediately  if symptoms less severe.  You Must  read complete instructions/literature along with all the possible adverse reactions/side effects for all the Medicines you take and that have been prescribed to you. Take any new Medicines after you have completely understood and accept all the possible adverse reactions/side effects.   Please note  You were cared for by a hospitalist during your hospital stay. If you have any questions about your discharge medications or the care you received while you were in the hospital after you are discharged, you can call the unit and asked to speak with the hospitalist on call if the hospitalist that took care of you is not available. Once you are discharged, your primary care physician will handle any further medical issues. Please note that NO REFILLS for any discharge medications will be authorized once you are discharged, as it is imperative that you return to your primary care physician (or establish a relationship with a primary care physician if you do not have one) for your aftercare needs so that they can reassess your need for medications and monitor your lab values.    Today   CHIEF COMPLAINT:   Chief Complaint  Patient presents with  . Cough  . Shortness of Breath  . Hemoptysis    Cough     HISTORY OF PRESENT ILLNESS:  Jeff Leonard  is a 66 y.o. male came in with hemoptysis   VITAL SIGNS:  Blood pressure 102/66, pulse 61, temperature 97.6 F (36.4 C), temperature source Oral, resp. rate 18, height 6\' 1"  (1.854 m), weight 132 kg (291 lb), SpO2 93 %.    PHYSICAL EXAMINATION:  GENERAL:  66 y.o.-year-old patient lying in the bed with no acute distress.  EYES: Pupils equal, round, reactive to light and accommodation. No scleral icterus. Extraocular muscles intact.  HEENT: Head atraumatic, normocephalic. Oropharynx and nasopharynx clear.  NECK:  Supple, no jugular venous distention. No thyroid enlargement, no tenderness.  LUNGS: Normal breath sounds bilaterally, no wheezing,  rales,rhonchi or crepitation. No use of accessory muscles of respiration. Better air entry today on the day of discharge CARDIOVASCULAR: S1, S2 normal. No murmurs, rubs, or gallops.  ABDOMEN: Soft, non-tender, non-distended. Bowel sounds present. No organomegaly or mass.  EXTREMITIES: No pedal edema, cyanosis, or clubbing.  NEUROLOGIC: Cranial nerves II through XII are intact. Muscle strength 5/5 in all extremities. Sensation intact. Gait not checked.  PSYCHIATRIC: The patient is alert and oriented x 3.  SKIN: No obvious rash, lesion, or ulcer.   DATA REVIEW:   CBC  Recent Labs Lab 04/01/17 0421  WBC 11.6*  HGB 12.6*  HCT 37.3*  PLT 327    Chemistries   Recent Labs Lab 04/01/17 0421  NA 138  K 4.4  CL 104  CO2 26  GLUCOSE 121*  BUN 18  CREATININE 1.45*  CALCIUM 9.1    Cardiac Enzymes  Recent Labs Lab 03/31/17 2321  TROPONINI <0.03      RADIOLOGY:  Dg Chest 2 View  Result Date: 03/31/2017 CLINICAL DATA:  Shortness of breath, hemoptysis. Recent diagnosis of pulmonary embolism. Evaluate chest tightness. EXAM: CHEST  2 VIEW COMPARISON:  CT chest March 24, 2017 FINDINGS:  Cardiomediastinal silhouette is normal. Small RIGHT pleural effusion with bibasilar strandy densities. Mild bronchitic changes. No pneumothorax. Soft tissue planes and included osseous structures are nonsuspicious. IMPRESSION: Similar small RIGHT pleural effusion with bibasilar atelectasis and bronchitic changes. Electronically Signed   By: Awilda Metro M.D.   On: 03/31/2017 22:31   Ct Angio Chest Pe W Or Wo Contrast  Result Date: 04/01/2017 CLINICAL DATA:  Acute onset of shortness of breath and generalized chest tightness. Hemoptysis. Initial encounter. EXAM: CT ANGIOGRAPHY CHEST WITH CONTRAST TECHNIQUE: Multidetector CT imaging of the chest was performed using the standard protocol during bolus administration of intravenous contrast. Multiplanar CT image reconstructions and MIPs were obtained to  evaluate the vascular anatomy. CONTRAST:  75 mL of Isovue 370 IV contrast COMPARISON:  CTA of the chest performed 03/24/2017, and chest radiograph performed 03/31/2017 FINDINGS: Cardiovascular: Pulmonary emboli are again noted within the pulmonary arteries to the right upper and lower lobes, and at the left main pulmonary artery. This is slightly improved from the prior study. Underlying right heart strain is again noted. The heart remains normal in size. The thoracic aorta is grossly unremarkable. The great vessels are within normal limits. Mediastinum/Nodes: Visualized subcarinal and azygoesophageal recess nodes remain borderline normal in size. No pericardial effusion is identified. The visualized portions of the thyroid gland are grossly unremarkable. No axillary lymphadenopathy is appreciated. Lungs/Pleura: A small right pleural effusion is noted, increased in size from the prior study. Underlying pulmonary infarct at the right lung base is somewhat worsened, and superimposed pneumonia cannot be excluded. The left lung appears relatively clear. No pneumothorax is seen. Upper Abdomen: The liver and spleen are unremarkable in appearance. The gallbladder is grossly unremarkable in appearance. The visualized portions of the pancreas and adrenal glands are within normal limits. Mild left renal atrophy is noted, with left renal cysts. Musculoskeletal: No acute osseous abnormalities are identified. The visualized musculature is unremarkable in appearance. Review of the MIP images confirms the above findings. IMPRESSION: 1. Pulmonary emboli again noted within the pulmonary arteries to the right upper and lower lobes, and at the left main pulmonary artery. Extent of pulmonary emboli is slightly improved from the prior study. Underlying right heart strain again noted. 2. Small right pleural effusion has increased in size from the prior study. Underlying pulmonary infarct at the right lung base is somewhat worsened, and  superimposed pneumonia cannot be excluded. 3. Mild left renal atrophy, with left renal cysts. These results were called by telephone at the time of interpretation on 04/01/2017 at 2:05 am to Dr. Lucrezia Europe, who verbally acknowledged these results. Electronically Signed   By: Roanna Raider M.D.   On: 04/01/2017 02:06       Management plans discussed with the patient, and he is in agreement.  CODE STATUS:     Code Status Orders        Start     Ordered   04/01/17 0419  Full code  Continuous     04/01/17 0419    Code Status History    Date Active Date Inactive Code Status Order ID Comments User Context   03/24/2017  1:14 PM 03/27/2017  8:29 PM Full Code [id number]  Altamese Dilling, MD Inpatient    Advance Directive Documentation     Most Recent Value  Type of Advance Directive  Living will, Healthcare Power of Attorney  Pre-existing out of facility DNR order (yellow form or pink MOST form)  -  "MOST" Form in Place?  -  TOTAL TIME TAKING CARE OF THIS PATIENT: 35 minutes.    Alford HighlandWIETING, Shauntell Iglesia M.D on 04/02/2017 at 4:47 PM  Between 7am to 6pm - Pager - [phone]  After 6pm go to [email or web address] - password [email or web address]  [employer] Office  [phone]  CC: Primary care physician; Sherrie MustacheJadali, Fayegh, MD

## 2017-04-02 NOTE — Discharge Instructions (Addendum)
Bronchospasm, Adult Bronchospasm is when airways in the lungs get smaller. When this happens, it can be hard to breathe. You may cough. You may also make a whistling sound when you breathe (wheeze). Follow these instructions at home: Medicines  Take over-the-counter and prescription medicines only as told by your doctor.  If you need to use an inhaler or nebulizer to take your medicine, ask your doctor how to use it.  If you were given a spacer, always use it with your inhaler. Lifestyle  Change your heating and air conditioning filter. Do this at least once a month.  Try not to use fireplaces and wood stoves.  Do not  smoke. Do not  allow smoking in your home.  Try not to use things that have a strong smell, like perfume.  Get rid of pests (such as roaches and mice) and their poop.  Remove any mold from your home.  Keep your house clean. Get rid of dust.  Use cleaning products that have no smell.  Replace carpet with wood, tile, or vinyl flooring.  Use allergy-proof pillows, mattress covers, and box spring covers.  Wash bed sheets and blankets every week. Use hot water. Dry them in a dryer.  Use blankets that are made of polyester or cotton.  Wash your hands often.  Keep pets out of your bedroom.  When you exercise, try not to breathe in cold air. General instructions  Have a plan for getting medical care. Know these things: ? When to call your doctor. ? When to call local emergency services (911 in the [place] ? Where to go in an emergency.  Stay up to date on your shots (immunizations).  When you have an episode: ? Stay calm. ? Relax. ? Breathe slowly. Contact a doctor if:  Your muscles ache.  Your chest hurts.  The color of the mucus you cough up (sputum) changes from clear or white to yellow, green, gray, or bloody.  The mucus you cough up gets thicker.  You have a fever. Get help right away if:  The whistling sound gets worse, even after you  take your medicines.  Your coughing gets worse.  You find it even harder to breathe.  Your chest hurts very much. Summary  Bronchospasm is when airways in the lungs get smaller.  When this happens, it can be hard to breathe. You may cough. You may also make a whistling sound when you breathe.  Stay away from things that cause you to have episodes. These include smoke or dust. This information is not intended to replace advice given to you by your health care provider. Make sure you discuss any questions you have with your health care provider. Document Released: 08/04/2009 Document Revised: 10/10/2016 Document Reviewed: 10/10/2016 Elsevier Interactive Patient Education  2017 Else[employer] Inc. Hemoptysis Hemoptysis is coughing up blood. It can be mild or serious. With mild hemoptysis, you may cough up blood-streaked saliva and mucus (sputum) when you have an infection in your nose, throat, or lungs (respiratory tract). Coughing up 1-2 cups (240-480 mL) of blood within 24 hours (massive hemoptysis) is a medical emergency. The most common cause of hemoptysis is a respiratory tract infection, such as bronchitis or pneumonia. Other common causes include:  A lung tumor or upper airway tumor.  A medical condition that damages the large air passageways (bronchiectasis).  A blood clot in the lungs (pulmonary embolism).  A medical condition that keeps your blood from clotting normally.  Breathing in (inhaling) a small  foreign object.  Sometimes the cause is not known. Hemoptysis can be a sign of a minor or serious medical condition, so it is important to see your health care provider. Follow these instructions at home:  Watch your condition for any changes.  Take over-the-counter and prescription medicines only as told by your health care provider.  If you were prescribed an antibiotic medicine, take it as told by your health care provider. Do not stop taking the antibiotic even if you start  to feel better.  Return to your normal activities as told by your health care provider. Ask your health care provider what activities are safe for you.  Do not use any products that contain nicotine or tobacco, such as cigarettes and e-cigarettes. If you need help quitting, ask your health care provider.  Keep all follow-up visits as told by your health care provider. This is important. Contact a health care provider if:  You have a fever.  You cough up blood-streaked (blood-tinged) sputum. Get help right away if:  You cough up fresh blood or blood clots.  You have trouble breathing.  You have chest pain. This information is not intended to replace advice given to you by your health care provider. Make sure you discuss any questions you have with your health care provider. Document Released: 12/16/2001 Document Revised: 07/05/2016 Document Reviewed: 07/05/2016 Elsevier Interactive Patient Education  [employer]2018 Else[employer] Inc.

## 2017-04-02 NOTE — Care Management Obs Status (Signed)
MEDICARE OBSERVATION STATUS NOTIFICATION   Patient Details  Name: Jeff GheeGlenn Leonard MRN: [id number] Date of Birth: [birth year]   Medicare Observation Status Notification Given:  Yes    Eber HongGreene, Gregorey Nabor R, RN 04/02/2017, 1:23 PM

## 2017-04-02 NOTE — Care Management CC44 (Signed)
Condition Code 44 Documentation Completed  Patient Details  Name: Jeff Leonard MRN: [id number] Date of Birth: Nov 14, 1950   Condition Code 44 given:   yes Patient signature on Condition Code 44 notice:   yes Documentation of 2 MD's agreement:   yes Code 44 added to claim:   yes    Eber HongGreene, Linh Hedberg R, RN 04/02/2017, 1:23 PM

## 2017-04-02 NOTE — Progress Notes (Signed)
Patient discharged via wheelchair and private vehicle. IV removed and catheter intact. All discharge instructions given and patient verbalizes understanding. Tele removed and returned. Prescriptions given to patient No distress noted.   

## 2017-04-02 NOTE — Plan of Care (Signed)
Problem: Education: Goal: Knowledge of [facility] Education information/materials will improve Outcome: Progressing Patient admitted for SOB with 3 episodes of coughing up a little blood. Patient was recently diagnosed with pulmonary embolism with pulmonary infarct. The plan of care includes cardiac monitoring, continue with anticoagulant therapy, respiratory treatments and all other health conditions will me monitored while in the hospital.

## 2017-04-07 ENCOUNTER — Ambulatory Visit (INDEPENDENT_AMBULATORY_CARE_PROVIDER_SITE_OTHER): Payer: Medicare PPO | Admitting: Internal Medicine

## 2017-04-07 ENCOUNTER — Encounter: Payer: Self-pay | Admitting: Internal Medicine

## 2017-04-07 VITALS — BP 132/78 | HR 82 | Ht 73.0 in | Wt 294.0 lb

## 2017-04-07 DIAGNOSIS — R0602 Shortness of breath: Secondary | ICD-10-CM

## 2017-04-07 DIAGNOSIS — G4719 Other hypersomnia: Secondary | ICD-10-CM | POA: Diagnosis not present

## 2017-04-07 NOTE — Progress Notes (Signed)
Name: Albertha GheeGlenn Skalsky MRN:   [id number] DOB:   28-Dec-1950            CONSULTATION DATE: 03/31/2017  REFERRING MD :  Pyreddy  CHIEF COMPLAINT:  follow up hemoptysis  STUDIES:  CT of the chest shows bilateral pulmonary embolism along with a right-sided pleural effusion along with her right sided lower lobe opacification Ultrasound of his lower extremities is positive for nonocclusive DVT   HISTORY OF PRESENT ILLNESS:   GlennWrightis a 65 y.o.malewith a known history of Pulmonary embolism, glaucoma, myocardial infarction presented to the emergency room for coughing of blood. -Patient was recently diagnosed with pulmonary embolus was in last week and started on oral xareltofor anticoagulation. -I prescribed patient dulera for acute bronchitis and is doing well with this inhaler  Patient on appropriate anticoagulation therapy   There is no evidence of infection at this time First Episode of PE was in 2014 and was on Xarelto and was taken off in 2017   Patient feels much better since discharge No signs of infection at this time No signs of congestive heart failure at this time Patient has a diagnosis of PE Patient has some intermittent shortness of breath and dyspnea on exertion    Patient has been having excessive daytime sleepiness Patient has been having extreme fatigue and tiredness, lack of energy Patient has been told that she has very  Loud snoring every night Epworth sleep score 14    PAST MEDICAL HISTORY :   has a past medical history of Glaucoma; pulmonary embolus; Myocardial infarction (HCC); and Past heart attack.  has a past surgical history that includes Shoulder surgery.        Prior to Admission medications   Medication Sig Start Date End Date Taking? Authorizing Provider  amoxicillin-clavulanate (AUGMENTIN) 875-125 MG tablet Take 1 tablet by mouth every 12 (twelve) hours. 03/27/17 04/01/17 Yes Altamese DillingVachhani, Vaibhavkumar, MD  Brinzolamide-Brimonidine  [facility](SIMBRINZA) 1-0.2 % SUSP INSTILL 1 DROP IN BOTH EYES BID 07/13/15  Yes [provider]  ergocalciferol (VITAMIN D2) 50000 units capsule Take 1 capsule by mouth once a week.   Yes [provider]  gabapentin (NEURONTIN) 300 MG capsule Take 300 mg by mouth. 12/21/14  Yes [provider]  HYDROcodone-acetaminophen (NORCO/VICODIN) 5-325 MG tablet Take 1 tablet by mouth 3 (three) times daily as needed for moderate pain.   Yes [provider]  latanoprost (XALATAN) 0.005 % ophthalmic solution PLACE 1 DROP INTO BOTH EYES TWICE A DAY 02/13/17  Yes [provider]  Rivaroxaban 15 & 20 MG TBPK Take as directed on package: Start with one 15mg  tablet by mouth twice a day with food. On Day 22, switch to one 20mg  tablet once a day with food. 03/26/17  Yes Altamese DillingVachhani, Vaibhavkumar, MD  timolol (TIMOPTIC) 0.5 % ophthalmic solution INSTILL 1 DROP INTO BOTH EYES EVERY MORNING 02/14/17  Yes [provider]  travoprost, benzalkonium, (TRAVATAN) 0.004 % ophthalmic solution Place 1 drop into both eyes at bedtime.   Yes [provider]   No Known Allergies  FAMILY HISTORY:  family history includes Asthma in his maternal grandfather; Cancer in his paternal uncle and paternal uncle; Heart disease (age of onset: 5961) in his father; Hypertension in his mother. SOCIAL HISTORY:  reports that he has never smoked. He has never used smokeless tobacco. He reports that he does not drink alcohol or use drugs.  REVIEW OF SYSTEMS:   Constitutional: Negative for fever, chills, weight loss, +malaise/fatigue and diaphoresis.  HENT: Negative  for hearing loss, ear pain, nosebleeds, congestion, sore throat, neck pain, tinnitus and ear discharge.   Eyes: Negative for blurred vision, double vision, photophobia, pain, discharge and redness.  Respiratory: -hemoptysis, -sputum production,+ shortness of breath, -wheezing and -stridor.   Cardiovascular: Negative for chest pain,  palpitations, orthopnea, claudication, leg swelling and PND.  Gastrointestinal: Negative for heartburn, nausea, vomiting, abdominal pain, diarrhea, constipation, blood in stool and melena.  Genitourinary: Negative for dysuria, urgency, frequency, hematuria and flank pain.  Musculoskeletal: Negative for myalgias, back pain, joint pain and falls.  Skin: Negative for itching and rash.  Neurological: Negative for dizziness, tingling, tremors, sensory change, speech change, focal weakness, seizures, loss of consciousness, weakness and headaches.  Endo/Heme/Allergies: Negative for environmental allergies and polydipsia. Does not bruise/bleed easily.  ALL OTHER ROS ARE NEGATIVE    BP 132/78 (BP Location: Left Arm, Cuff Size: Normal)   Pulse 82   Ht 6\' 1"  (1.854 m)   Wt 294 lb (133.4 kg)   SpO2 99%   BMI 38.79 kg/m    Physical Examination:   GENERAL:NAD, no fevers, chills, no weakness no fatigue HEAD: Normocephalic, atraumatic.  EYES: Pupils equal, round, reactive to light. Extraocular muscles intact. No scleral icterus.  MOUTH: Moist mucosal membrane.   EAR, NOSE, THROAT: Clear without exudates. No external lesions.  NECK: Supple. No thyromegaly. No nodules. No JVD.  PULMONARY:CTA B/L no wheezes, no crackles, no rhonchi CARDIOVASCULAR: S1 and S2. Regular rate and rhythm. No murmurs, rubs, or gallops. No edema.  GASTROINTESTINAL: Soft, nontender, nondistended. No masses. Positive bowel sounds.  MUSCULOSKELETAL: No swelling, clubbing, or edema. Range of motion full in all extremities.  NEUROLOGIC: Cranial nerves II through XII are intact. No gross focal neurological deficits.  SKIN: No ulceration, lesions, rashes, or cyanosis. Skin warm and dry. Turgor intact.  PSYCHIATRIC: Mood, affect within normal limits. The patient is awake, alert and oriented x 3. Insight, judgment intact.       LastLabs   Recent Labs Lab 03/26/17 0356 03/31/17 2321 04/01/17 0421  NA 136 137 138   K 4.7 4.4 4.4  CL 104 102 104  CO2 26 26 26   BUN 23* 18 18  CREATININE 1.37* 1.63* 1.45*  GLUCOSE 114* 96 121*      LastLabs   Recent Labs Lab 03/26/17 0356 03/31/17 2321 04/01/17 0421  HGB 13.9 14.5 12.6*  HCT 41.0 41.2 37.3*  WBC 11.1* 10.6 11.6*  PLT 187 343 327      ImagingResults(Last48hours)  Dg Chest 2 View  Result Date: 03/31/2017 CLINICAL DATA:  Shortness of breath, hemoptysis. Recent diagnosis of pulmonary embolism. Evaluate chest tightness. EXAM: CHEST  2 VIEW COMPARISON:  CT chest March 24, 2017 FINDINGS: Cardiomediastinal silhouette is normal. Small RIGHT pleural effusion with bibasilar strandy densities. Mild bronchitic changes. No pneumothorax. Soft tissue planes and included osseous structures are nonsuspicious. IMPRESSION: Similar small RIGHT pleural effusion with bibasilar atelectasis and bronchitic changes. Electronically Signed   By: Awilda Metro M.D.   On: 03/31/2017 22:31   Ct Angio Chest Pe W Or Wo Contrast  Result Date: 04/01/2017 CLINICAL DATA:  Acute onset of shortness of breath and generalized chest tightness. Hemoptysis. Initial encounter. EXAM: CT ANGIOGRAPHY CHEST WITH CONTRAST TECHNIQUE: Multidetector CT imaging of the chest was performed using the standard protocol during bolus administration of intravenous contrast. Multiplanar CT image reconstructions and MIPs were obtained to evaluate the vascular anatomy. CONTRAST:  75 mL of Isovue 370 IV contrast COMPARISON:  CTA of the chest performed 03/24/2017,  and chest radiograph performed 03/31/2017 FINDINGS: Cardiovascular: Pulmonary emboli are again noted within the pulmonary arteries to the right upper and lower lobes, and at the left main pulmonary artery. This is slightly improved from the prior study. Underlying right heart strain is again noted. The heart remains normal in size. The thoracic aorta is grossly unremarkable. The great vessels are within normal limits. Mediastinum/Nodes:  Visualized subcarinal and azygoesophageal recess nodes remain borderline normal in size. No pericardial effusion is identified. The visualized portions of the thyroid gland are grossly unremarkable. No axillary lymphadenopathy is appreciated. Lungs/Pleura: A small right pleural effusion is noted, increased in size from the prior study. Underlying pulmonary infarct at the right lung base is somewhat worsened, and superimposed pneumonia cannot be excluded. The left lung appears relatively clear. No pneumothorax is seen. Upper Abdomen: The liver and spleen are unremarkable in appearance. The gallbladder is grossly unremarkable in appearance. The visualized portions of the pancreas and adrenal glands are within normal limits. Mild left renal atrophy is noted, with left renal cysts. Musculoskeletal: No acute osseous abnormalities are identified. The visualized musculature is unremarkable in appearance. Review of the MIP images confirms the above findings. IMPRESSION: 1. Pulmonary emboli again noted within the pulmonary arteries to the right upper and lower lobes, and at the left main pulmonary artery. Extent of pulmonary emboli is slightly improved from the prior study. Underlying right heart strain again noted. 2. Small right pleural effusion has increased in size from the prior study. Underlying pulmonary infarct at the right lung base is somewhat worsened, and superimposed pneumonia cannot be excluded. 3. Mild left renal atrophy, with left renal cysts. These results were called by telephone at the time of interpretation on 04/01/2017 at 2:05 am to Dr. Lucrezia Europe, who verbally acknowledged these results. Electronically Signed   By: Roanna Raider M.D.   On: 04/01/2017 02:06    I have Independently reviewed images of CT chest from  04/01/2017 04/07/2017  Interpretation:b/l PE, RLL effusion, RLL opacity  ASSESSMENT / PLAN: 66 year old male admitted to the hospital for submassive hemoptysis in the setting of a  diagnosis of bilateral pulmonary embolism with underlying nonocclusive DVT in the lower extremities with probable underlying reactive airway disease with bronchitis in the setting of morbid obesity and deconditioned state and signs and symptoms of sleep apnea  His submassive hemoptysis may have resulted from acute Bronchitis while on anticoagulation therapy  #1 bilateral PE with nonocclusive DVT Continue oral anticoagulation as prescribed This is patient's second bout of PE which would indicate that he is a candidate for oral anticoagulation indefinitely Repeating CT chest or ultrasound is not indicated at this time -If patient were to have any type of bleeding complications from his oral anticoagulation therapy he would then be a candidate for IVC filter placement  #2 underlying reactive airway disease likely related to his morbid obesity with a history of bronchitis I would recommend continuing inhaled corticosteroids and L ABA with dulera at this time No indication for steroids at this time Albuterol as needed  #3 Obesity -recommend significant weight loss -recommend changing diet  #4 Deconditioned state -Recommend increased daily activity and exercise  #5 signs and symptoms of sleep apnea Recommend sleep study with oxygen bleed in as needed    Patient/ satisfied with Plan of action and management. All questions answered  follow-up in 3 months  Sheree Lalla Santiago Glad, M.D.  [facility]  Medical Director [facility] Medical Director [facility] Cardio-Pulmonary Department

## 2017-04-07 NOTE — Patient Instructions (Signed)
Continue inhalers as prescribed Split night sleep study needed

## 2017-04-09 NOTE — Progress Notes (Signed)
[facility]  Telephone:(336) [phone] Fax:(336) [phone]  ID: Albertha GheeGlenn Esper OB: 05/08/1951  MR#: [id number]  EXB#:[id number]#:[id number]  Patient Care Team: Sherrie MustacheJadali, Fayegh, MD as PCP - General (Internal Medicine)  CHIEF COMPLAINT: Pulmonary embolus.  INTERVAL HISTORY: Patient is 66 year old male who recently presented to the emergency room with increasing chest pain and shortness of breath. Subsequent workup revealed a large bilateral pulmonary embolus. This appears unprovoked and patient does not report any transient risk factors. He is currently taking Xarelto without significant side effects. He denies any easy bleeding or bruising. He currently feels well and is asymptomatic. He has no neurologic complaints. He denies any recent fevers. He has a good appetite and denies weight loss. He has no further chest pain or shortness of breath. He denies any nausea, vomiting, constipation, or diarrhea. He has no urinary complaints. Patient feels at his baseline and offers no specific complaints today.  REVIEW OF SYSTEMS:   Review of Systems  Constitutional: Negative.  Negative for fever, malaise/fatigue and weight loss.  Respiratory: Negative.  Negative for cough, hemoptysis and shortness of breath.   Cardiovascular: Negative.  Negative for chest pain and leg swelling.  Gastrointestinal: Negative for abdominal pain, blood in stool and melena.  Genitourinary: Negative.  Negative for hematuria.  Musculoskeletal: Negative.   Skin: Negative.  Negative for rash.  Neurological: Negative.  Negative for weakness.  Endo/Heme/Allergies: Does not bruise/bleed easily.  Psychiatric/Behavioral: Negative.  The patient is not nervous/anxious.     As per HPI. Otherwise, a complete review of systems is negative.  PAST MEDICAL HISTORY: Past Medical History:  Diagnosis Date  . Glaucoma   . Hx of pulmonary embolus   . Myocardial infarction (HCC)   . Past heart attack     PAST SURGICAL HISTORY: Past  Surgical History:  Procedure Laterality Date  . SHOULDER SURGERY     left shoulder     FAMILY HISTORY: Family History  Problem Relation Age of Onset  . Heart disease Father 6061  . Hypertension Mother   . Cancer Mother        Breast cancer   . Asthma Maternal Grandfather   . Cancer Paternal Uncle        lung    ADVANCED DIRECTIVES (Y/N):  N  HEALTH MAINTENANCE: Social History  Substance Use Topics  . Smoking status: Never Smoker  . Smokeless tobacco: Never Used  . Alcohol use No     Colonoscopy:  PAP:  Bone density:  Lipid panel:  No Known Allergies  Current Outpatient Prescriptions  Medication Sig Dispense Refill  . albuterol (PROVENTIL HFA;VENTOLIN HFA) 108 (90 Base) MCG/ACT inhaler Inhale 2 puffs into the lungs every 6 (six) hours as needed for wheezing or shortness of breath. 1 Inhaler 0  . Brinzolamide-Brimonidine (SIMBRINZA) 1-0.2 % SUSP INSTILL 1 DROP IN BOTH EYES BID    . gabapentin (NEURONTIN) 300 MG capsule Take 300 mg by mouth.    Marland Kitchen. HYDROcodone-acetaminophen (NORCO/VICODIN) 5-325 MG tablet Take 1 tablet by mouth 3 (three) times daily as needed for moderate pain.    Marland Kitchen. latanoprost (XALATAN) 0.005 % ophthalmic solution PLACE 1 DROP INTO BOTH EYES TWICE A DAY  6  . mometasone-formoterol (DULERA) 100-5 MCG/ACT AERO Inhale 2 puffs into the lungs 2 (two) times daily. 1 Inhaler 0  . Rivaroxaban 15 & 20 MG TBPK Take as directed on package: Start with one 15mg  tablet by mouth twice a day with food. On Day 22, switch to one 20mg  tablet once a  day with food. 51 each 0  . timolol (TIMOPTIC) 0.5 % ophthalmic solution INSTILL 1 DROP INTO BOTH EYES EVERY MORNING  6   No current facility-administered medications for this visit.     OBJECTIVE: Vitals:   04/10/17 0922  BP: 116/75  Pulse: 66  Resp: 16  Temp: (!) 96.6 F (35.9 C)     Body mass index is 38.29 kg/m.    ECOG FS:0 - Asymptomatic  General: Well-developed, well-nourished, no acute distress. Eyes: Pink  conjunctiva, anicteric sclera. HEENT: Normocephalic, moist mucous membranes, clear oropharnyx. Lungs: Clear to auscultation bilaterally. Heart: Regular rate and rhythm. No rubs, murmurs, or gallops. Abdomen: Soft, nontender, nondistended. No organomegaly noted, normoactive bowel sounds. Musculoskeletal: No edema, cyanosis, or clubbing. Neuro: Alert, answering all questions appropriately. Cranial nerves grossly intact. Skin: No rashes or petechiae noted. Psych: Normal affect. Lymphatics: No cervical, calvicular, axillary or inguinal LAD.   LAB RESULTS:  Lab Results  Component Value Date   NA 138 04/01/2017   K 4.4 04/01/2017   CL 104 04/01/2017   CO2 26 04/01/2017   GLUCOSE 121 (H) 04/01/2017   BUN 18 04/01/2017   CREATININE 1.45 (H) 04/01/2017   CALCIUM 9.1 04/01/2017   PROT 7.4 03/24/2017   ALBUMIN 3.7 03/24/2017   AST 17 03/24/2017   ALT 17 03/24/2017   ALKPHOS 59 03/24/2017   BILITOT 1.5 (H) 03/24/2017   GFRNONAA 49 (L) 04/01/2017   GFRAA 57 (L) 04/01/2017    Lab Results  Component Value Date   WBC 11.6 (H) 04/01/2017   NEUTROABS 7.5 (H) 03/24/2017   HGB 12.6 (L) 04/01/2017   HCT 37.3 (L) 04/01/2017   MCV 95.8 04/01/2017   PLT 327 04/01/2017     STUDIES: Dg Chest 2 View  Result Date: 03/31/2017 CLINICAL DATA:  Shortness of breath, hemoptysis. Recent diagnosis of pulmonary embolism. Evaluate chest tightness. EXAM: CHEST  2 VIEW COMPARISON:  CT chest March 24, 2017 FINDINGS: Cardiomediastinal silhouette is normal. Small RIGHT pleural effusion with bibasilar strandy densities. Mild bronchitic changes. No pneumothorax. Soft tissue planes and included osseous structures are nonsuspicious. IMPRESSION: Similar small RIGHT pleural effusion with bibasilar atelectasis and bronchitic changes. Electronically Signed   By: Awilda Metro M.D.   On: 03/31/2017 22:31   Ct Angio Chest Pe W Or Wo Contrast  Result Date: 04/01/2017 CLINICAL DATA:  Acute onset of shortness of  breath and generalized chest tightness. Hemoptysis. Initial encounter. EXAM: CT ANGIOGRAPHY CHEST WITH CONTRAST TECHNIQUE: Multidetector CT imaging of the chest was performed using the standard protocol during bolus administration of intravenous contrast. Multiplanar CT image reconstructions and MIPs were obtained to evaluate the vascular anatomy. CONTRAST:  75 mL of Isovue 370 IV contrast COMPARISON:  CTA of the chest performed 03/24/2017, and chest radiograph performed 03/31/2017 FINDINGS: Cardiovascular: Pulmonary emboli are again noted within the pulmonary arteries to the right upper and lower lobes, and at the left main pulmonary artery. This is slightly improved from the prior study. Underlying right heart strain is again noted. The heart remains normal in size. The thoracic aorta is grossly unremarkable. The great vessels are within normal limits. Mediastinum/Nodes: Visualized subcarinal and azygoesophageal recess nodes remain borderline normal in size. No pericardial effusion is identified. The visualized portions of the thyroid gland are grossly unremarkable. No axillary lymphadenopathy is appreciated. Lungs/Pleura: A small right pleural effusion is noted, increased in size from the prior study. Underlying pulmonary infarct at the right lung base is somewhat worsened, and superimposed pneumonia cannot be excluded.  The left lung appears relatively clear. No pneumothorax is seen. Upper Abdomen: The liver and spleen are unremarkable in appearance. The gallbladder is grossly unremarkable in appearance. The visualized portions of the pancreas and adrenal glands are within normal limits. Mild left renal atrophy is noted, with left renal cysts. Musculoskeletal: No acute osseous abnormalities are identified. The visualized musculature is unremarkable in appearance. Review of the MIP images confirms the above findings. IMPRESSION: 1. Pulmonary emboli again noted within the pulmonary arteries to the right upper and  lower lobes, and at the left main pulmonary artery. Extent of pulmonary emboli is slightly improved from the prior study. Underlying right heart strain again noted. 2. Small right pleural effusion has increased in size from the prior study. Underlying pulmonary infarct at the right lung base is somewhat worsened, and superimposed pneumonia cannot be excluded. 3. Mild left renal atrophy, with left renal cysts. These results were called by telephone at the time of interpretation on 04/01/2017 at 2:05 am to Dr. Lucrezia Europe, who verbally acknowledged these results. Electronically Signed   By: Roanna Raider M.D.   On: 04/01/2017 02:06   Ct Angio Chest Pe W Or Wo Contrast  Result Date: 03/24/2017 CLINICAL DATA:  Shortness of Breath EXAM: CT ANGIOGRAPHY CHEST WITH CONTRAST TECHNIQUE: Multidetector CT imaging of the chest was performed using the standard protocol during bolus administration of intravenous contrast. Multiplanar CT image reconstructions and MIPs were obtained to evaluate the vascular anatomy. CONTRAST:  100 mL Isovue 370 nonionic COMPARISON:  Chest radiograph July 20, 2013. FINDINGS: Cardiovascular: There is extensive pulmonary embolism arising from from the distal left main pulmonary artery extending into multiple upper and lower lobe branches on the left. On the right, there are multiple pulmonary emboli and upper and lower lobe pulmonary arteries which arises distal to the main pulmonary outflow tract. The right ventricle to left ventricle diameter ratio is 1.0, concerning for right heart strain. There is no appreciable thoracic aortic aneurysm or dissection. The visualized great vessels appear unremarkable. Pericardium is not appreciably thickened. Mediastinum/Nodes: Visualized thyroid appears unremarkable. There are scattered subcentimeter mediastinal lymph nodes but no frank adenopathy is evident by size criteria. Lungs/Pleura: There is patchy airspace opacity in the lower lobes bilaterally  consistent with patchy pneumonia in the bases. There is atelectatic change in the inferior most aspect of the lingula and right middle lobe. There is a small right pleural effusion. Upper Abdomen: There is apparent sludge in the gallbladder. Visualized upper abdominal structures otherwise appear unremarkable. Musculoskeletal: There is degenerative change in the thoracic spine. There are no blastic or lytic bone lesions. Review of the MIP images confirms the above findings. IMPRESSION: Extensive multifocal pulmonary embolus bilaterally with evidence suggesting a degree of right heart strain. Positive for acute PE with CT evidence of right heart strain (RV/LV Ratio = 1.0) consistent with at least submassive (intermediate risk) PE. The presence of right heart strain has been associated with an increased risk of morbidity and mortality. Please activate Code PE by paging [phone]. No thoracic aortic aneurysm or dissection. Patchy infiltrate, likely pneumonia, in the bases. Small right pleural effusion. No evident adenopathy. Apparent sludge in gallbladder. Critical Value/emergent results were called by telephone at the time of interpretation on 03/24/2017 at 10:22 am to Dr. Gladstone Pih , who verbally acknowledged these results. Electronically Signed   By: Bretta Bang III M.D.   On: 03/24/2017 10:22   US Venous Img Lower Bilateral  Result Date: 03/25/2017 CLINICAL DATA:  Bilateral lower  extremity pain and swelling for the past week. History of prior DVT and pulmonary embolism. Evaluate for acute or chronic DVT. EXAM: BILATERAL LOWER EXTREMITY VENOUS DOPPLER ULTRASOUND TECHNIQUE: Gray-scale sonography with graded compression, as well as color Doppler and duplex ultrasound were performed to evaluate the lower extremity deep venous systems from the level of the common femoral vein and including the common femoral, femoral, profunda femoral, popliteal and calf veins including the posterior tibial, peroneal and  gastrocnemius veins when visible. The superficial great saphenous vein was also interrogated. Spectral Doppler was utilized to evaluate flow at rest and with distal augmentation maneuvers in the common femoral, femoral and popliteal veins. COMPARISON:  Left lower extremity venous Doppler ultrasound - 04/02/2026 FINDINGS: RIGHT LOWER EXTREMITY Common Femoral Vein: No evidence of thrombus. Normal compressibility, respiratory phasicity and response to augmentation. Saphenofemoral Junction: No evidence of thrombus. Normal compressibility and flow on color Doppler imaging. Profunda Femoral Vein: No evidence of thrombus. Normal compressibility and flow on color Doppler imaging. Femoral Vein: No evidence of thrombus. Normal compressibility, respiratory phasicity and response to augmentation. Popliteal Vein: No evidence of thrombus. Normal compressibility, respiratory phasicity and response to augmentation. Calf Veins: No evidence of thrombus. Normal compressibility and flow on color Doppler imaging. Superficial Great Saphenous Vein: No evidence of thrombus. Normal compressibility and flow on color Doppler imaging. Venous Reflux:  None. Other Findings: Note is made of a non pathologically enlarged right inguinal lymph node which measures approximately 0.7 in greatest short axis diameter (image 47). Note is made of a serpiginous approximately 4.8 x 1.7 x 2.4 cm fluid collection with the right popliteal fossa which is favored to represent a Baker cyst. LEFT LOWER EXTREMITY Common Femoral Vein: No evidence of thrombus. Normal compressibility, respiratory phasicity and response to augmentation. Saphenofemoral Junction: No evidence of thrombus. Normal compressibility and flow on color Doppler imaging. Profunda Femoral Vein: No evidence of thrombus. Normal compressibility and flow on color Doppler imaging. Femoral Vein: No evidence of thrombus. Normal compressibility, respiratory phasicity and response to augmentation. Popliteal  Vein: No evidence of thrombus. Normal compressibility, respiratory phasicity and response to augmentation. Calf Veins: Note is made of hypoechoic expansile occlusive thrombus within the left posterior tibial vein (representative image 84) and nonocclusive thrombus within one of the paired left peroneal veins (image 87). Superficial Great Saphenous Vein: No evidence of thrombus. Normal compressibility and flow on color Doppler imaging. Venous Reflux:  None. Other Findings: Note is made of a non pathologically enlarged left inguinal lymph node which measures approximately 0.9 cm in diameter and maintains a benign fatty hilum. IMPRESSION: 1. The examination is positive for mixed occlusive and nonocclusive distal tibial DVT. There is no extension of this distal calf DVT to the more proximal deep venous system of the left lower extremity. 2. No evidence acute or chronic DVT within the right lower extremity. 3. Incidentally noted approximately 4.8 cm right-sided Baker cyst. Electronically Signed   By: Simonne Come M.D.   On: 03/25/2017 15:31    ASSESSMENT: Pulmonary embolus.  PLAN:    1. Pulmonary embolus: CT scan results reviewed independently and reported as above. This is patient's second unprovoked pulmonary embolus. Patient does not recall having a hypercoagulable workup with his first blood clot. Hypercoagulable workup is initially negative, although much of the results are pending at time of dictation. Given that this is his second pulmonary embolism, patient will likely require lifelong anticoagulation. Patient has already expressed desire to continue his Xarelto lifelong. Return to clinic in 3 months  for further evaluation and discussion of his workup. If patient elects to continue lifelong Xarelto, he likely can be discharged from clinic with close follow-up with his primary care physician.  Approximately 45 minutes was spent in discussion of which greater than 50% was consultation.  Patient expressed  understanding and was in agreement with this plan. He also understands that He can call clinic at any time with any questions, concerns, or complaints.    Jeralyn Ruths, MD   04/13/2017 8:10 AM

## 2017-04-10 ENCOUNTER — Other Ambulatory Visit: Payer: Self-pay

## 2017-04-10 ENCOUNTER — Inpatient Hospital Stay: Payer: Medicare PPO

## 2017-04-10 ENCOUNTER — Inpatient Hospital Stay: Payer: Medicare PPO | Attending: Oncology | Admitting: Oncology

## 2017-04-10 ENCOUNTER — Encounter: Payer: Self-pay | Admitting: Oncology

## 2017-04-10 VITALS — BP 116/75 | HR 66 | Temp 96.6°F | Resp 16 | Ht 73.0 in | Wt 290.2 lb

## 2017-04-10 DIAGNOSIS — Z801 Family history of malignant neoplasm of trachea, bronchus and lung: Secondary | ICD-10-CM | POA: Diagnosis not present

## 2017-04-10 DIAGNOSIS — R590 Localized enlarged lymph nodes: Secondary | ICD-10-CM | POA: Insufficient documentation

## 2017-04-10 DIAGNOSIS — I2609 Other pulmonary embolism with acute cor pulmonale: Secondary | ICD-10-CM

## 2017-04-10 DIAGNOSIS — Z803 Family history of malignant neoplasm of breast: Secondary | ICD-10-CM | POA: Diagnosis not present

## 2017-04-10 DIAGNOSIS — N281 Cyst of kidney, acquired: Secondary | ICD-10-CM | POA: Diagnosis not present

## 2017-04-10 DIAGNOSIS — Z79899 Other long term (current) drug therapy: Secondary | ICD-10-CM | POA: Insufficient documentation

## 2017-04-10 DIAGNOSIS — I252 Old myocardial infarction: Secondary | ICD-10-CM | POA: Diagnosis not present

## 2017-04-10 DIAGNOSIS — J90 Pleural effusion, not elsewhere classified: Secondary | ICD-10-CM | POA: Insufficient documentation

## 2017-04-10 DIAGNOSIS — I2699 Other pulmonary embolism without acute cor pulmonale: Secondary | ICD-10-CM

## 2017-04-10 LAB — ANTITHROMBIN III: AntiThromb III Func: 91 % (ref 75–120)

## 2017-04-10 NOTE — Progress Notes (Signed)
Patient here today as a new patient.  [facility].  C/o shortness of breath at times

## 2017-04-11 LAB — HOMOCYSTEINE: Homocysteine: 14.8 umol/L (ref 0.0–15.0)

## 2017-04-11 LAB — CARDIOLIPIN ANTIBODIES, IGG, IGM, IGA: Anticardiolipin IgG: <9 GPL U/mL (ref 0–14)

## 2017-04-11 LAB — PROTEIN C, TOTAL: PROTEIN C, TOTAL: 86 % (ref 60–150)

## 2017-04-12 LAB — BETA-2-GLYCOPROTEIN I ABS, IGG/M/A
Beta-2 Glyco I IgG: <9 GPI IgG units (ref 0–20)
Beta-2-Glycoprotein I IgM: <9 GPI IgM units (ref 0–32)

## 2017-04-12 LAB — PROTEIN S, TOTAL: PROTEIN S AG TOTAL: 96 % (ref 60–150)

## 2017-04-12 LAB — PROTEIN S ACTIVITY: Protein S Activity: 150 % — ABNORMAL HIGH (ref 63–140)

## 2017-04-12 LAB — PROTEIN C ACTIVITY: Protein C Activity: 104 % (ref 73–180)

## 2017-04-13 LAB — DRVVT MIX: DRVVT MIX: 113.8 s — AB (ref 0.0–47.0)

## 2017-04-13 LAB — DRVVT CONFIRM: DRVVT CONFIRM: 1.4 ratio — AB (ref 0.8–1.2)

## 2017-04-13 LAB — LUPUS ANTICOAGULANT PANEL
DRVVT: 150.7 s — ABNORMAL HIGH (ref 0.0–47.0)
PTT LA: 70.6 s — AB (ref 0.0–51.9)

## 2017-04-13 LAB — PTT-LA MIX: PTT-LA Mix: 61.6 s — ABNORMAL HIGH (ref 0.0–48.9)

## 2017-04-13 LAB — HEXAGONAL PHASE PHOSPHOLIPID: HEXAGONAL PHASE PHOSPHOLIPID: 25 s — AB (ref 0–11)

## 2017-04-14 LAB — FACTOR 5 LEIDEN

## 2017-04-14 LAB — PROTHROMBIN GENE MUTATION

## 2017-05-29 ENCOUNTER — Encounter: Payer: Self-pay | Admitting: Internal Medicine

## 2017-05-29 ENCOUNTER — Ambulatory Visit: Payer: Medicare PPO | Attending: Neurology

## 2017-05-29 DIAGNOSIS — G4733 Obstructive sleep apnea (adult) (pediatric): Secondary | ICD-10-CM | POA: Insufficient documentation

## 2017-05-29 DIAGNOSIS — G4761 Periodic limb movement disorder: Secondary | ICD-10-CM | POA: Insufficient documentation

## 2017-05-29 DIAGNOSIS — G4710 Hypersomnia, unspecified: Secondary | ICD-10-CM | POA: Diagnosis present

## 2017-05-29 DIAGNOSIS — G4719 Other hypersomnia: Secondary | ICD-10-CM

## 2017-05-30 DIAGNOSIS — G4733 Obstructive sleep apnea (adult) (pediatric): Secondary | ICD-10-CM | POA: Diagnosis not present

## 2017-06-02 ENCOUNTER — Telehealth: Payer: Self-pay | Admitting: *Deleted

## 2017-06-02 DIAGNOSIS — G4733 Obstructive sleep apnea (adult) (pediatric): Secondary | ICD-10-CM

## 2017-06-02 NOTE — Telephone Encounter (Signed)
Patient aware of sleep study results and need for titration study. Orders entered. Nothing further needed.

## 2017-06-09 ENCOUNTER — Ambulatory Visit: Payer: Self-pay | Admitting: Cardiovascular Disease

## 2017-07-16 NOTE — Progress Notes (Signed)
[facility]  Telephone:(336) [phone] Fax:(336) [phone]  ID: [name] OB: 04/20/1951  MR#: [id number]  AOZ#:[id number]  Patient Care Team: Sherrie Mustache, MD as PCP - General (Internal Medicine)  CHIEF COMPLAINT: Pulmonary embolus.  INTERVAL HISTORY: Patient returns to clinic today for further evaluation and discussion of his laboratory work. He is tolerating Xarelto well without significant side effects. He currently feels well and is asymptomatic. He has no neurologic complaints. He denies any recent fevers. He has a good appetite and denies weight loss. He has no further chest pain or shortness of breath. He denies any nausea, vomiting, constipation, or diarrhea. He melena or hematochezia. He has no urinary complaints. Patient feels at his baseline and offers no specific complaints today.  REVIEW OF SYSTEMS:   Review of Systems  Constitutional: Negative.  Negative for fever, malaise/fatigue and weight loss.  Respiratory: Negative.  Negative for cough, hemoptysis and shortness of breath.   Cardiovascular: Negative.  Negative for chest pain and leg swelling.  Gastrointestinal: Negative for abdominal pain, blood in stool and melena.  Genitourinary: Negative.  Negative for hematuria.  Musculoskeletal: Negative.   Skin: Negative.  Negative for rash.  Neurological: Negative.  Negative for weakness.  Endo/Heme/Allergies: Does not bruise/bleed easily.  Psychiatric/Behavioral: Negative.  The patient is not nervous/anxious.     As per HPI. Otherwise, a complete review of systems is negative.  PAST MEDICAL HISTORY: Past Medical History:  Diagnosis Date  . Glaucoma   . Hx of pulmonary embolus   . Myocardial infarction (HCC)   . Past heart attack     PAST SURGICAL HISTORY: Past Surgical History:  Procedure Laterality Date  . SHOULDER SURGERY     left shoulder     FAMILY HISTORY: Family History  Problem Relation Age of Onset  . Heart disease Father 5  .  Hypertension Mother   . Cancer Mother        Breast cancer   . Asthma Maternal Grandfather   . Cancer Paternal Uncle        lung    ADVANCED DIRECTIVES (Y/N):  N  HEALTH MAINTENANCE: Social History  Substance Use Topics  . Smoking status: Never Smoker  . Smokeless tobacco: Never Used  . Alcohol use No     Colonoscopy:  PAP:  Bone density:  Lipid panel:  No Known Allergies  Current Outpatient Prescriptions  Medication Sig Dispense Refill  . albuterol (PROVENTIL HFA;VENTOLIN HFA) 108 (90 Base) MCG/ACT inhaler Inhale 2 puffs into the lungs every 6 (six) hours as needed for wheezing or shortness of breath. 1 Inhaler 0  . Brinzolamide-Brimonidine (SIMBRINZA) 1-0.2 % SUSP INSTILL 1 DROP IN BOTH EYES BID    . gabapentin (NEURONTIN) 300 MG capsule Take 300 mg by mouth.    Marland Kitchen HYDROcodone-acetaminophen (NORCO/VICODIN) 5-325 MG tablet Take 1 tablet by mouth 3 (three) times daily as needed for moderate pain.    Marland Kitchen latanoprost (XALATAN) 0.005 % ophthalmic solution PLACE 1 DROP INTO BOTH EYES TWICE A DAY  6  . mometasone-formoterol (DULERA) 100-5 MCG/ACT AERO Inhale 2 puffs into the lungs 2 (two) times daily. 1 Inhaler 0  . Rivaroxaban 15 & 20 MG TBPK Take as directed on package: Start with one  tablet by mouth twice a day with food. On Day 22, switch to one  tablet once a day with food. 51 each 0  . timolol (TIMOPTIC) 0.5 % ophthalmic solution INSTILL 1 DROP INTO BOTH EYES EVERY MORNING  6   No current  facility-administered medications for this visit.     OBJECTIVE: Vitals:   07/17/17 1420  BP: 139/82  Pulse: 73  Resp: 18  Temp: (!) 96.8 F (36 C)     Body mass index is 38.47 kg/m.    ECOG FS:0 - Asymptomatic  General: Well-developed, well-nourished, no acute distress. Eyes: Pink conjunctiva, anicteric sclera. HEENT: Normocephalic, moist mucous membranes, clear oropharnyx. Lungs: Clear to auscultation bilaterally. Heart: Regular rate and rhythm. No rubs, murmurs, or  gallops. Abdomen: Soft, nontender, nondistended. No organomegaly noted, normoactive bowel sounds. Musculoskeletal: No edema, cyanosis, or clubbing. Neuro: Alert, answering all questions appropriately. Cranial nerves grossly intact. Skin: No rashes or petechiae noted. Psych: Normal affect. Lymphatics: No cervical, calvicular, axillary or inguinal LAD.   LAB RESULTS:  Lab Results  Component Value Date   NA 138 04/01/2017   K 4.4 04/01/2017   CL 104 04/01/2017   CO2 26 04/01/2017   GLUCOSE 121 (H) 04/01/2017   BUN 18 04/01/2017   CREATININE 1.45 (H) 04/01/2017   CALCIUM 9.1 04/01/2017   PROT 7.4 03/24/2017   ALBUMIN 3.7 03/24/2017   AST 17 03/24/2017   ALT 17 03/24/2017   ALKPHOS 59 03/24/2017   BILITOT 1.5 (H) 03/24/2017   GFRNONAA 49 (L) 04/01/2017   GFRAA 57 (L) 04/01/2017    Lab Results  Component Value Date   WBC 11.6 (H) 04/01/2017   NEUTROABS 7.5 (H) 03/24/2017   HGB 12.6 (L) 04/01/2017   HCT 37.3 (L) 04/01/2017   MCV 95.8 04/01/2017   PLT 327 04/01/2017     STUDIES: No results found.  ASSESSMENT: Pulmonary embolus.  PLAN:    1. Pulmonary embolus: CT scan results reviewed independently and reported as above. This is patient's second unprovoked pulmonary embolus. Hypercoagulable workup is negative. Given that this is his second pulmonary embolism, patient will likely require lifelong anticoagulation. After lengthy discussion with the patient, he wishes to remain on chronic anticoagulation. No further follow-up has been scheduled. Patient has been instructed to obtain refills for Xarelto from his primary care physician.   Approximately 30 minutes was spent in discussion of which greater than 50% was consultation.  Patient expressed understanding and was in agreement with this plan. He also understands that He can call clinic at any time with any questions, concerns, or complaints.    Jeralyn Ruths, MD   07/18/2017 5:35 PM

## 2017-07-17 ENCOUNTER — Inpatient Hospital Stay: Payer: Medicare PPO | Attending: Oncology | Admitting: Oncology

## 2017-07-17 VITALS — BP 139/82 | HR 73 | Temp 96.8°F | Resp 18 | Wt 291.6 lb

## 2017-07-17 DIAGNOSIS — I2699 Other pulmonary embolism without acute cor pulmonale: Secondary | ICD-10-CM | POA: Insufficient documentation

## 2017-07-17 DIAGNOSIS — Z803 Family history of malignant neoplasm of breast: Secondary | ICD-10-CM | POA: Insufficient documentation

## 2017-07-17 DIAGNOSIS — I2609 Other pulmonary embolism with acute cor pulmonale: Secondary | ICD-10-CM

## 2017-07-17 DIAGNOSIS — Z7901 Long term (current) use of anticoagulants: Secondary | ICD-10-CM | POA: Diagnosis not present

## 2017-07-17 DIAGNOSIS — I252 Old myocardial infarction: Secondary | ICD-10-CM | POA: Diagnosis not present

## 2017-07-17 DIAGNOSIS — Z79899 Other long term (current) drug therapy: Secondary | ICD-10-CM | POA: Insufficient documentation

## 2017-07-17 DIAGNOSIS — Z801 Family history of malignant neoplasm of trachea, bronchus and lung: Secondary | ICD-10-CM

## 2017-07-17 NOTE — Progress Notes (Signed)
Patient denies any concerns today.  

## 2017-07-23 ENCOUNTER — Ambulatory Visit: Payer: Medicare PPO | Attending: Internal Medicine

## 2017-07-23 DIAGNOSIS — G4733 Obstructive sleep apnea (adult) (pediatric): Secondary | ICD-10-CM | POA: Insufficient documentation

## 2017-08-01 DIAGNOSIS — G4733 Obstructive sleep apnea (adult) (pediatric): Secondary | ICD-10-CM | POA: Diagnosis not present

## 2017-08-04 ENCOUNTER — Telehealth: Payer: Self-pay | Admitting: *Deleted

## 2017-08-04 DIAGNOSIS — G4733 Obstructive sleep apnea (adult) (pediatric): Secondary | ICD-10-CM

## 2017-08-04 NOTE — Telephone Encounter (Signed)
-----   Message from Shane Crutch, MD sent at 08/01/2017  2:33 PM EDT ----- Regarding: titration study results.  Recommendation: Auto CPAP of 7-10  CmH2O F&P [place], Large

## 2017-08-04 NOTE — Telephone Encounter (Signed)
LMTCB

## 2017-08-05 NOTE — Telephone Encounter (Signed)
Patient says someone called again but did not leave a msg and he is not sure if this is about  the sleep study results

## 2017-08-05 NOTE — Telephone Encounter (Signed)
Patient aware old message.

## 2017-08-05 NOTE — Telephone Encounter (Signed)
Patient aware of results. Orders placed for cpap Nothing further needed.

## 2017-12-30 ENCOUNTER — Ambulatory Visit
Admission: RE | Admit: 2017-12-30 | Discharge: 2017-12-30 | Disposition: A | Discharge status: Home / Self Care | Payer: Medicare PPO | Source: Ambulatory Visit | Attending: Internal Medicine | Admitting: Internal Medicine

## 2017-12-30 ENCOUNTER — Other Ambulatory Visit: Payer: Self-pay | Admitting: Internal Medicine

## 2017-12-30 DIAGNOSIS — R05 Cough: Secondary | ICD-10-CM

## 2017-12-30 DIAGNOSIS — R059 Cough, unspecified: Secondary | ICD-10-CM

## 2017-12-30 DIAGNOSIS — J984 Other disorders of lung: Secondary | ICD-10-CM | POA: Diagnosis not present

## 2018-06-03 ENCOUNTER — Ambulatory Visit: Payer: Medicare PPO | Admitting: Urology

## 2018-06-03 ENCOUNTER — Encounter: Payer: Self-pay | Admitting: Urology

## 2018-06-03 VITALS — BP 123/77 | HR 81 | Ht 73.0 in | Wt 294.3 lb

## 2018-06-03 DIAGNOSIS — N5201 Erectile dysfunction due to arterial insufficiency: Secondary | ICD-10-CM | POA: Diagnosis not present

## 2018-06-03 DIAGNOSIS — E291 Testicular hypofunction: Secondary | ICD-10-CM

## 2018-06-03 NOTE — Progress Notes (Signed)
06/03/2018 2:09 PM   Jeff Leonard June 09, 1951 [id number]  Referring provider: Casilda Carls, MD No address on file  Chief Complaint  Patient presents with  . Hypogonadism    HPI: 67 year old male referred by Dr. Rosario Jacks for evaluation of hypogonadism.  He presents with a several month history of extreme tiredness, weakness, decreased libido and erectile dysfunction.  He also complains of joint pain.  He has partial erections which are not firm enough for penetration.  Organic risk factors include coronary artery disease, antihypertensive medications.  He has sleep apnea which has not been treated.  He denies previous history of urologic problems or prior urologic evaluation.  A testosterone level drawn in May 2019 was 263 (reference [id number]).   PMH: Past Medical History:  Diagnosis Date  . Glaucoma   . Hormone disorder   . Hx of pulmonary embolus   . Myocardial infarction ([place])   . Past heart attack   . Sleep apnea     Surgical History: Past Surgical History:  Procedure Laterality Date  . SHOULDER SURGERY     left shoulder     Home Medications:  Allergies as of 06/03/2018   No Known Allergies     Medication List        Accurate as of 06/03/18  2:09 PM. Always use your most recent med list.          albuterol 108 (90 Base) MCG/ACT inhaler Commonly known as:  PROVENTIL HFA;VENTOLIN HFA Inhale 2 puffs into the lungs every 6 (six) hours as needed for wheezing or shortness of breath.   B-D SINGLE USE SWABS REGULAR Pads   cyclobenzaprine 5 MG tablet Commonly known as:  FLEXERIL Take 5 mg by mouth at bedtime as needed.   dorzolamidel-timolol 22.3-6.8 MG/ML Soln ophthalmic solution Commonly known as:  COSOPT dorzolamide-timolol (PF) 2 %-0.5 % eye drops in a dropperette   gabapentin 300 MG capsule Commonly known as:  NEURONTIN Take 300 mg by mouth.   HYDROcodone-acetaminophen 5-325 MG tablet Commonly known as:  NORCO/VICODIN Take 1 tablet by mouth 3  (three) times daily as needed for moderate pain.   latanoprost 0.005 % ophthalmic solution Commonly known as:  XALATAN PLACE 1 DROP INTO BOTH EYES TWICE A DAY   lisinopril 5 MG tablet Commonly known as:  PRINIVIL,ZESTRIL   mometasone-formoterol 100-5 MCG/ACT Aero Commonly known as:  DULERA Inhale 2 puffs into the lungs 2 (two) times daily.   SIMBRINZA 1-0.2 % Susp Generic drug:  Brinzolamide-Brimonidine INSTILL 1 DROP IN BOTH EYES BID   timolol 0.5 % ophthalmic solution Commonly known as:  TIMOPTIC INSTILL 1 DROP INTO BOTH EYES EVERY MORNING   TRAVATAN Z 0.004 % Soln ophthalmic solution Generic drug:  Travoprost (BAK Free) Travatan Z 0.004 % eye drops  INSTILL 1 DROP INTO BOTH EYES AT BEDTIME   TRUE METRIX AIR GLUCOSE METER w/Device Kit   TRUE METRIX BLOOD GLUCOSE TEST test strip Generic drug:  glucose blood   TRUE METRIX LEVEL 1 Low Soln   TRUEPLUS LANCETS 33G Misc   Vitamin D (Ergocalciferol) 50000 units Caps capsule Commonly known as:  DRISDOL TAKE ONE CAPSULE BY MOUTH ONE TIME PER WEEK   XARELTO 20 MG Tabs tablet Generic drug:  rivaroxaban       Allergies: No Known Allergies  Family History: Family History  Problem Relation Age of Onset  . Heart disease Father 27  . Hypertension Mother   . Cancer Mother        Breast cancer   .  Asthma Maternal Grandfather   . Cancer Paternal Uncle        lung    Social History:  reports that he has never smoked. He has never used smokeless tobacco. He reports that he does not drink alcohol or use drugs.  ROS: UROLOGY Frequent Urination?: Yes Hard to postpone urination?: No Burning/pain with urination?: No Get up at night to urinate?: Yes Leakage of urine?: No Urine stream starts and stops?: Yes Trouble starting stream?: No Do you have to strain to urinate?: No Blood in urine?: No Urinary tract infection?: No Sexually transmitted disease?: No Injury to kidneys or bladder?: No Painful intercourse?: No Weak  stream?: No Erection problems?: Yes Penile pain?: No  Gastrointestinal Nausea?: No Vomiting?: No Indigestion/heartburn?: No Diarrhea?: No Constipation?: No  Constitutional Fever: No Night sweats?: No Weight loss?: No Fatigue?: Yes  Skin Skin rash/lesions?: No Itching?: No  Eyes Blurred vision?: No Double vision?: No  Ears/Nose/Throat Sore throat?: No Sinus problems?: No  Hematologic/Lymphatic Swollen glands?: No Easy bruising?: No  Cardiovascular Leg swelling?: Yes Chest pain?: No  Respiratory Cough?: Yes Shortness of breath?: Yes  Endocrine Excessive thirst?: No  Musculoskeletal Back pain?: No Joint pain?: Yes  Neurological Headaches?: No Dizziness?: No  Psychologic Depression?: No Anxiety?: No  Physical Exam: BP 123/77 (BP Location: Left Arm, Patient Position: Sitting, Cuff Size: Large)   Pulse 81   Ht _0  (1.854 m)   Wt 294 lb 4.8 oz (133.5 kg)   BMI 38.83 kg/m   Constitutional:  Alert and oriented, No acute distress. HEENT: [place] AT, moist mucus membranes.  Trachea midline, no masses. Cardiovascular: No clubbing, cyanosis, or edema. Respiratory: Normal respiratory effort, no increased work of breathing. GI: Abdomen is soft, nontender, nondistended, no abdominal masses GU: No CVA tenderness.  Penis without lesions or corporal plaques.  Testes descended bilaterally with an estimated volume of 20 cc bilaterally.  Prostate 40 g, smooth without nodules. Lymph: No cervical or inguinal lymphadenopathy. Skin: No rashes, bruises or suspicious lesions. Neurologic: Grossly intact, no focal deficits, moving all 4 extremities. Psychiatric: Normal mood and affect.   Assessment & Plan:   67 year old male with hypogonadism symptoms.  He has had a low testosterone level which has not been repeated.  He was informed that insurance companies typically require 2 AM abnormal levels.  He will return for a a.m. testosterone level and will add a LH, prolactin  and PSA.  We discussed various forms of testosterone replacement including topicals and injection therapy.  The off label use of clomiphene was also discussed.  Potential side effects of testosterone replacement were discussed including stimulation of benign prostatic growth with lower urinary tract symptoms; erythrocytosis; edema; gynecomastia; worsening sleep apnea; venous thromboembolism; testicular atrophy and infertility. Recent studies suggesting an increased incidence of heart attack and stroke in patients taking testosterone was discussed. He was informed there is conflicting evidence regarding the impact of testosterone therapy on cardiovascular risk. The theoretical risk of growth stimulation of an undetected prostate cancer was also discussed.  He was informed that current evidence does not provide any definitive answers regarding the risks of testosterone therapy on prostate cancer and cardiovascular disease. The need for periodic monitoring of his testosterone level, PSA, hematocrit and DRE was discussed.   Abbie Sons, [place] [address], [place], [place] [address] [phone]

## 2018-06-04 ENCOUNTER — Other Ambulatory Visit: Payer: Medicare PPO

## 2018-06-04 ENCOUNTER — Other Ambulatory Visit: Payer: Self-pay | Admitting: Family Medicine

## 2018-06-04 DIAGNOSIS — E291 Testicular hypofunction: Secondary | ICD-10-CM

## 2018-06-05 LAB — TESTOSTERONE: Testosterone: 280 ng/dL (ref 264–916)

## 2018-06-07 ENCOUNTER — Encounter: Payer: Self-pay | Admitting: Urology

## 2018-06-08 ENCOUNTER — Telehealth: Payer: Self-pay

## 2018-06-08 DIAGNOSIS — E291 Testicular hypofunction: Secondary | ICD-10-CM

## 2018-06-08 NOTE — Telephone Encounter (Signed)
Called pt no answer. LM for pt informing him of the information below. Future lab orders placed.

## 2018-06-08 NOTE — Telephone Encounter (Signed)
-----   Message from Riki AltesScott C Stoioff, MD sent at 06/07/2018 10:20 AM EDT ----- Testosterone level is low normal.  Insurance would not cover testosterone replacement with this level.  Needs a free testosterone, LH and prolactin.

## 2018-06-09 NOTE — Telephone Encounter (Signed)
Patient returned call.  Results provided.    Lab appointment scheduled for a free testosterone, LH and prolactin.

## 2018-06-10 ENCOUNTER — Other Ambulatory Visit: Payer: Medicare PPO

## 2018-06-10 DIAGNOSIS — E291 Testicular hypofunction: Secondary | ICD-10-CM

## 2018-06-11 LAB — TESTOSTERONE, FREE: Testosterone, Free: 6 pg/mL — ABNORMAL LOW (ref 6.6–18.1)

## 2018-06-11 LAB — PROLACTIN: Prolactin: 19.8 ng/mL — ABNORMAL HIGH (ref 4.0–15.2)

## 2018-06-11 LAB — LUTEINIZING HORMONE: LH: 3.7 m[IU]/mL (ref 1.7–8.6)

## 2018-06-14 ENCOUNTER — Other Ambulatory Visit: Payer: Self-pay | Admitting: Urology

## 2018-06-14 MED ORDER — CLOMIPHENE CITRATE 50 MG PO TABS: ORAL_TABLET | ORAL | 1 refills | Status: AC

## 2018-06-16 ENCOUNTER — Telehealth: Payer: Self-pay

## 2018-06-16 NOTE — Telephone Encounter (Signed)
Called pt informed him of of the results below, pt gave verbal understanding. Lab appt scheduled.

## 2018-06-16 NOTE — Telephone Encounter (Signed)
-----   Message from Riki AltesScott C Stoioff, MD sent at 06/14/2018  9:32 AM EDT ----- His free testosterone level was low.  LH was also low normal.  Would recommend a trial of Clomid initially.  Rx was sent to his pharmacy.  Recommend a follow-up testosterone level in 6 weeks.  His prolactin was also mildly elevated and recommend he follow-up with Dr. Dario GuardianJadali for further evaluation.

## 2018-07-09 ENCOUNTER — Other Ambulatory Visit: Payer: Self-pay | Admitting: Internal Medicine

## 2018-07-09 DIAGNOSIS — J90 Pleural effusion, not elsewhere classified: Secondary | ICD-10-CM

## 2018-07-09 NOTE — Progress Notes (Addendum)
[facility]     Assessment and Plan:  Pleural effusion. - Patient has a new onset loculated right-sided pleural effusion of uncertain etiology.  There appears to be associated right upper lobe atelectasis.  Given these new findings he is at high risk for further decline. -We will need a thoracentesis, patient has been set up to undergo CT chest in 1 week, will perform a thoracentesis before the CT scan.  ADDENDUM: Thoracentesis was suggestive of loculated empyema vs. Hemothorax. Will need CT chest.   Dyspnea. -Likely secondary to above with addition to chronic comorbidities. -Patient asked to start using his rescue inhaler as needed for dyspnea.  Pulmonary hypertension. - Patient has a history of multiple pulmonary emboli in the past, review of previous CT chest at the time shows dilated pulmonary artery suggestive of pulmonary hypertension. - We will continue to follow, may need further work-up once acute issues are resolved.  Obstructive sleep apnea. -Previous diagnosis of obstructive sleep apnea. - Patient apparently never received CPAP, will work to see if we can get him on CPAP.  Orders Placed This Encounter  Procedures  . US THORACENTESIS ASP PLEURAL SPACE W/IMG GUIDE   Return in about 2 weeks (around 07/24/2018) for (after ct chest and thoracentesis). .   Date: 07/10/2018  MRN# [id number] Caulder Wehner [birth year]    Alberta Cairns is a 67 y.o. old male seen in consultation for chief complaint of:    Chief Complaint  Patient presents with  . Pleural Effusion    pt had cxr ordered by Dr. Wilhelmina Mcardle  . Shortness of Breath    with and without exertion  . Cough    brownish to clear mucus  . Wheezing    HPI:   The patient is a 67 year old male with a history of pulmonary embolism, left lower extremity DVT in June 2018. He has been having bad cough and a lot of and not feeling well, fatigued, weak and exhausted. He denies fever or chills.  He deines  reflux, sinus drainage.  She has never been diagnosed with sleep apnea.  His current symptoms started about 2 months ago, they have been progressing over that time.  He has been coughing, but minimally productive and denies hemoptysis.  Dr. Rosario Jacks has ordered a CT chest which is planned on 9/27 He has remained on xarelto 20 mg daily.   **CXR 07/08/18; imaging personally Large loculated right pleural effusion vs. With atelectasis of RUL.  Chest x-ray 12/30/2017>> imaging personally reviewed, slight right middle lobe infiltrate/adenopathy.  Otherwise lungs are unremarkable. **CT chest 04/01/2017>> image personally reviewed, moderate right pleural effusion with right basilar atelectasis.  Some of these changes are likely related to right pulmonary infarction related to concurrent right pulmonary embolism. Sleep study cpap titration 07/23/17>> Recommended auto-PAP 7-10 cm Sleep study 05/29/17>> AHI of 21, recommended sleep study.   PMHX:   Past Medical History:  Diagnosis Date  . Glaucoma   . Hormone disorder   . Hx of pulmonary embolus   . Myocardial infarction ([place])   . Past heart attack   . Sleep apnea    Surgical Hx:  Past Surgical History:  Procedure Laterality Date  . SHOULDER SURGERY     left shoulder    Family Hx:  Family History  Problem Relation Age of Onset  . Heart disease Father 43  . Hypertension Mother   . Cancer Mother        Breast cancer   . Asthma Maternal Grandfather   .  Cancer Paternal Uncle        lung   Social Hx:   Social History   Tobacco Use  . Smoking status: Never Smoker  . Smokeless tobacco: Never Used  Substance Use Topics  . Alcohol use: No  . Drug use: No   Medication:    Current Outpatient Medications:  .  albuterol (PROVENTIL HFA;VENTOLIN HFA) 108 (90 Base) MCG/ACT inhaler, Inhale 2 puffs into the lungs every 6 (six) hours as needed for wheezing or shortness of breath., Disp: 1 Inhaler, Rfl: 0 .  Alcohol Swabs (B-D SINGLE USE SWABS  REGULAR) PADS, , Disp: , Rfl:  .  azithromycin (ZITHROMAX) 250 MG tablet, Take 250 mg by mouth as directed., Disp: , Rfl:  .  benzonatate (TESSALON) 100 MG capsule, Take 100 mg by mouth daily as needed., Disp: , Rfl: 0 .  Blood Glucose Calibration (TRUE METRIX LEVEL 1) Low SOLN, , Disp: , Rfl:  .  Blood Glucose Monitoring Suppl (TRUE METRIX AIR GLUCOSE METER) w/Device KIT, , Disp: , Rfl:  .  Brinzolamide-Brimonidine (SIMBRINZA) 1-0.2 % SUSP, INSTILL 1 DROP IN BOTH EYES BID, Disp: , Rfl:  .  cetirizine (ZYRTEC) 10 MG tablet, Take 10 mg by mouth daily., Disp: , Rfl:  .  clomiPHENE (CLOMID) 50 MG tablet, One half tab daily, Disp: 15 tablet, Rfl: 1 .  cyclobenzaprine (FLEXERIL) 5 MG tablet, Take 5 mg by mouth at bedtime as needed., Disp: , Rfl: 0 .  dorzolamidel-timolol (COSOPT) 22.3-6.8 MG/ML SOLN ophthalmic solution, dorzolamide-timolol (PF) 2 %-0.5 % eye drops in a dropperette, Disp: , Rfl:  .  gabapentin (NEURONTIN) 300 MG capsule, Take 300 mg by mouth., Disp: , Rfl:  .  HYDROcodone-acetaminophen (NORCO/VICODIN) 5-325 MG tablet, Take 1 tablet by mouth 3 (three) times daily as needed for moderate pain., Disp: , Rfl:  .  latanoprost (XALATAN) 0.005 % ophthalmic solution, PLACE 1 DROP INTO BOTH EYES TWICE A DAY, Disp: , Rfl: 6 .  lisinopril (PRINIVIL,ZESTRIL) 5 MG tablet, , Disp: , Rfl:  .  timolol (TIMOPTIC) 0.5 % ophthalmic solution, INSTILL 1 DROP INTO BOTH EYES EVERY MORNING, Disp: , Rfl: 6 .  TRUE METRIX BLOOD GLUCOSE TEST test strip, , Disp: , Rfl:  .  TRUEPLUS LANCETS 33G MISC, , Disp: , Rfl:  .  Vitamin D, Ergocalciferol, (DRISDOL) 50000 units CAPS capsule, TAKE ONE CAPSULE BY MOUTH ONE TIME PER WEEK, Disp: , Rfl: 0 .  XARELTO 20 MG TABS tablet, , Disp: , Rfl:    Allergies:  Patient has no known allergies.  Review of Systems: Gen:  Denies  fever, sweats, chills HEENT: Denies blurred vision, double vision. bleeds, sore throat Cvc:  No dizziness, chest pain. Resp:   Denies hemoptysis  or pleuritic chest pain. Gi: Denies swallowing difficulty, stomach pain. Gu:  Denies bladder incontinence, burning urine Ext:   No Joint pain, stiffness. Skin: No skin rash,  hives  Endoc:  No polyuria, polydipsia. Psych: No depression, insomnia. Other:  All other systems were reviewed with the patient and were negative other that what is mentioned in the HPI.   Physical Examination:   VS: BP 110/80 (BP Location: Left Arm, Cuff Size: Large)   Pulse 77   Resp 16   Ht '6\' 1"'$  (1.854 m)   Wt 285 lb (129.3 kg)   SpO2 100%   BMI 37.60 kg/m   General Appearance: No distress  Neuro:without focal findings,  speech normal,  HEENT: PERRLA, EOM intact.   Pulmonary: normal breath  sounds, No wheezing.  CardiovascularNormal S1,S2.  No m/r/g.   Abdomen: Benign, Soft, non-tender. Renal:  No costovertebral tenderness  GU:  No performed at this time. Endoc: No evident thyromegaly, no signs of acromegaly. Skin:   warm, no rashes, no ecchymosis  Extremities: normal, no cyanosis, clubbing.  Other findings:    LABORATORY PANEL:   CBC No results for input(s): WBC, HGB, HCT, PLT in the last 168 hours. ------------------------------------------------------------------------------------------------------------------  Chemistries  No results for input(s): NA, K, CL, CO2, GLUCOSE, BUN, CREATININE, CALCIUM, MG, AST, ALT, ALKPHOS, BILITOT in the last 168 hours.  Invalid input(s): GFRCGP ------------------------------------------------------------------------------------------------------------------  Cardiac Enzymes No results for input(s): TROPONINI in the last 168 hours. ------------------------------------------------------------  RADIOLOGY:  No results found.     Thank  you for the consultation and for allowing [facility] to assist in the care of your patient. Our recommendations are noted above.  Please contact us if we can be of further service.   Marda Stalker, M.D., F.C.C.P.  Board Certified in Internal Medicine, Pulmonary Medicine, [place], and Sleep Medicine.  [facility] Office Number: [phone]   07/10/2018

## 2018-07-10 ENCOUNTER — Telehealth: Payer: Self-pay | Admitting: Internal Medicine

## 2018-07-10 ENCOUNTER — Ambulatory Visit: Payer: Medicare PPO | Admitting: Internal Medicine

## 2018-07-10 ENCOUNTER — Encounter: Payer: Self-pay | Admitting: Internal Medicine

## 2018-07-10 VITALS — BP 110/80 | HR 77 | Resp 16 | Ht 73.0 in | Wt 285.0 lb

## 2018-07-10 DIAGNOSIS — J942 Hemothorax: Secondary | ICD-10-CM

## 2018-07-10 DIAGNOSIS — J90 Pleural effusion, not elsewhere classified: Secondary | ICD-10-CM

## 2018-07-10 NOTE — Telephone Encounter (Signed)
Please call regarding appointment (thoracentesis)that was going to be schedule at checkout today.

## 2018-07-10 NOTE — Telephone Encounter (Signed)
Pt informed to be at [facility] at 12pm on 07/15/18 and to stop Eliquis on Monday 07/13/18. Pt verbalized understanding. Nothing further needed.

## 2018-07-10 NOTE — Patient Instructions (Addendum)
Will send you for a drainage (thoracentesis) next week.  Proceed with your CT chest on 9/27.  Follow up in 2 weeks (after ct chest and thoracentesis).   You must stop xarelto 2 days before the procedure.

## 2018-07-15 ENCOUNTER — Ambulatory Visit (HOSPITAL_BASED_OUTPATIENT_CLINIC_OR_DEPARTMENT_OTHER)
Admission: RE | Admit: 2018-07-15 | Discharge: 2018-07-15 | Disposition: A | Discharge status: Home / Self Care | Payer: Medicare PPO | Source: Ambulatory Visit | Attending: Internal Medicine | Admitting: Internal Medicine

## 2018-07-15 ENCOUNTER — Ambulatory Visit
Admission: RE | Admit: 2018-07-15 | Discharge: 2018-07-15 | Disposition: A | Discharge status: Home / Self Care | Payer: Medicare PPO | Source: Ambulatory Visit | Attending: Internal Medicine | Admitting: Internal Medicine

## 2018-07-15 DIAGNOSIS — J942 Hemothorax: Secondary | ICD-10-CM | POA: Diagnosis not present

## 2018-07-15 DIAGNOSIS — Z9889 Other specified postprocedural states: Secondary | ICD-10-CM

## 2018-07-15 DIAGNOSIS — J90 Pleural effusion, not elsewhere classified: Secondary | ICD-10-CM

## 2018-07-15 LAB — GLUCOSE, PLEURAL OR PERITONEAL FLUID: Glucose, Fluid: 90 mg/dL

## 2018-07-15 LAB — ALBUMIN, PLEURAL OR PERITONEAL FLUID: ALBUMIN FL: 2.8 g/dL

## 2018-07-15 LAB — PROTEIN, PLEURAL OR PERITONEAL FLUID: TOTAL PROTEIN, FLUID: 5.9 g/dL

## 2018-07-15 LAB — AMYLASE, PLEURAL OR PERITONEAL FLUID: Amylase, Fluid: 52 U/L

## 2018-07-15 LAB — LACTATE DEHYDROGENASE, PLEURAL OR PERITONEAL FLUID: LD, Fluid: UNDETERMINED U/L (ref 3–23)

## 2018-07-15 MED ORDER — LIDOCAINE HCL (PF) 1 % IJ SOLN
INTRAMUSCULAR | Status: AC
  Filled 2018-07-15: qty 5

## 2018-07-15 NOTE — Procedures (Addendum)
PROCEDURE NOTE: THORACENTESIS    Date: 07/15/2018, MRN# [id number] Jeff Leonard   Indication: Right pleural effusion  A time-out was completed verifying correct patient, procedure, site, positioning, and implant(s) or special equipment if applicable.  Ultrasound guidance was used and appropriate fluid pocket was identified and marked.   Patient was positioned, prepped and draped in usual sterile fashion. 1% Lidocaine was used to anesthetize the area.   Under ultrasound guidance a right upper lobe loculated pleural effusion was noted, this effusion appeared to be high density, considerably higher than a typical fluid density. The site was anesthetized with 1% topical lidocaine.  The thoracentesis kit was used, the trocar was introduced over the tube.  It was advanced until fluid was aspirated into the syringe, the fluid appeared to be grossly bloody.  Approximately 15 cc could be drained, further fluid could not be drained.  Therefore the procedure was stopped, obtained fluid was sent to the lab for evaluation.  A chest xray was ordered to evaluate for pneumothorax.  Total Fluid Removed: 15 cc of grossly bloody fluid.  DDx: Loculated empyema vs. Hemothorax.  --Will check CT chest.   Patient tolerated the procedure well and there were no complications noted.  Marda Stalker, M.D., F.C.C.P.  Board Certified in Internal Medicine, Pulmonary Medicine, [place], and Sleep Medicine.  [facility] Office Number: [phone]

## 2018-07-15 NOTE — Progress Notes (Signed)
Thoracentesis right side performed by Dr. Nicholos Johns.  1235 to 1247.  15 cc of bloody drainage obtained and sent to lab.  Patient tolerated well.

## 2018-07-16 ENCOUNTER — Inpatient Hospital Stay
Admission: EM | Admit: 2018-07-16 | Discharge: 2018-08-21 | DRG: 163 | Disposition: E | Payer: Medicare PPO | Attending: Internal Medicine | Admitting: Internal Medicine

## 2018-07-16 ENCOUNTER — Encounter: Payer: Self-pay | Admitting: Emergency Medicine

## 2018-07-16 ENCOUNTER — Ambulatory Visit
Admission: RE | Admit: 2018-07-16 | Discharge: 2018-07-16 | Disposition: A | Discharge status: Home / Self Care | Payer: Medicare PPO | Source: Ambulatory Visit | Attending: Internal Medicine | Admitting: Internal Medicine

## 2018-07-16 ENCOUNTER — Other Ambulatory Visit: Payer: Self-pay

## 2018-07-16 DIAGNOSIS — R34 Anuria and oliguria: Secondary | ICD-10-CM | POA: Diagnosis not present

## 2018-07-16 DIAGNOSIS — R140 Abdominal distension (gaseous): Secondary | ICD-10-CM

## 2018-07-16 DIAGNOSIS — Z7901 Long term (current) use of anticoagulants: Secondary | ICD-10-CM

## 2018-07-16 DIAGNOSIS — Z9689 Presence of other specified functional implants: Secondary | ICD-10-CM

## 2018-07-16 DIAGNOSIS — D649 Anemia, unspecified: Secondary | ICD-10-CM | POA: Diagnosis not present

## 2018-07-16 DIAGNOSIS — J941 Fibrothorax: Secondary | ICD-10-CM | POA: Diagnosis present

## 2018-07-16 DIAGNOSIS — M79604 Pain in right leg: Secondary | ICD-10-CM | POA: Diagnosis not present

## 2018-07-16 DIAGNOSIS — R1110 Vomiting, unspecified: Secondary | ICD-10-CM

## 2018-07-16 DIAGNOSIS — J9382 Other air leak: Secondary | ICD-10-CM | POA: Diagnosis not present

## 2018-07-16 DIAGNOSIS — R000 Tachycardia, unspecified: Secondary | ICD-10-CM | POA: Diagnosis not present

## 2018-07-16 DIAGNOSIS — H5702 Anisocoria: Secondary | ICD-10-CM | POA: Diagnosis not present

## 2018-07-16 DIAGNOSIS — E875 Hyperkalemia: Secondary | ICD-10-CM | POA: Diagnosis not present

## 2018-07-16 DIAGNOSIS — I2510 Atherosclerotic heart disease of native coronary artery without angina pectoris: Secondary | ICD-10-CM | POA: Diagnosis present

## 2018-07-16 DIAGNOSIS — I272 Pulmonary hypertension, unspecified: Secondary | ICD-10-CM | POA: Diagnosis present

## 2018-07-16 DIAGNOSIS — N179 Acute kidney failure, unspecified: Secondary | ICD-10-CM

## 2018-07-16 DIAGNOSIS — R627 Adult failure to thrive: Secondary | ICD-10-CM | POA: Diagnosis not present

## 2018-07-16 DIAGNOSIS — J942 Hemothorax: Secondary | ICD-10-CM | POA: Insufficient documentation

## 2018-07-16 DIAGNOSIS — I824Z9 Acute embolism and thrombosis of unspecified deep veins of unspecified distal lower extremity: Secondary | ICD-10-CM | POA: Diagnosis not present

## 2018-07-16 DIAGNOSIS — E162 Hypoglycemia, unspecified: Secondary | ICD-10-CM | POA: Diagnosis not present

## 2018-07-16 DIAGNOSIS — Z825 Family history of asthma and other chronic lower respiratory diseases: Secondary | ICD-10-CM

## 2018-07-16 DIAGNOSIS — R001 Bradycardia, unspecified: Secondary | ICD-10-CM | POA: Diagnosis not present

## 2018-07-16 DIAGNOSIS — Z515 Encounter for palliative care: Secondary | ICD-10-CM | POA: Diagnosis not present

## 2018-07-16 DIAGNOSIS — D72829 Elevated white blood cell count, unspecified: Secondary | ICD-10-CM | POA: Diagnosis not present

## 2018-07-16 DIAGNOSIS — R4182 Altered mental status, unspecified: Secondary | ICD-10-CM | POA: Diagnosis not present

## 2018-07-16 DIAGNOSIS — Z452 Encounter for adjustment and management of vascular access device: Secondary | ICD-10-CM

## 2018-07-16 DIAGNOSIS — R680 Hypothermia, not associated with low environmental temperature: Secondary | ICD-10-CM | POA: Diagnosis not present

## 2018-07-16 DIAGNOSIS — G9340 Encephalopathy, unspecified: Secondary | ICD-10-CM | POA: Diagnosis not present

## 2018-07-16 DIAGNOSIS — Z6838 Body mass index (BMI) 38.0-38.9, adult: Secondary | ICD-10-CM

## 2018-07-16 DIAGNOSIS — Z01818 Encounter for other preprocedural examination: Secondary | ICD-10-CM

## 2018-07-16 DIAGNOSIS — I10 Essential (primary) hypertension: Secondary | ICD-10-CM | POA: Diagnosis present

## 2018-07-16 DIAGNOSIS — Z803 Family history of malignant neoplasm of breast: Secondary | ICD-10-CM

## 2018-07-16 DIAGNOSIS — R579 Shock, unspecified: Secondary | ICD-10-CM | POA: Diagnosis not present

## 2018-07-16 DIAGNOSIS — Z79899 Other long term (current) drug therapy: Secondary | ICD-10-CM

## 2018-07-16 DIAGNOSIS — J948 Other specified pleural conditions: Secondary | ICD-10-CM | POA: Diagnosis present

## 2018-07-16 DIAGNOSIS — Z801 Family history of malignant neoplasm of trachea, bronchus and lung: Secondary | ICD-10-CM

## 2018-07-16 DIAGNOSIS — E871 Hypo-osmolality and hyponatremia: Secondary | ICD-10-CM | POA: Diagnosis not present

## 2018-07-16 DIAGNOSIS — R918 Other nonspecific abnormal finding of lung field: Secondary | ICD-10-CM | POA: Insufficient documentation

## 2018-07-16 DIAGNOSIS — Z86718 Personal history of other venous thrombosis and embolism: Secondary | ICD-10-CM

## 2018-07-16 DIAGNOSIS — Z86711 Personal history of pulmonary embolism: Secondary | ICD-10-CM

## 2018-07-16 DIAGNOSIS — J9601 Acute respiratory failure with hypoxia: Secondary | ICD-10-CM | POA: Diagnosis not present

## 2018-07-16 DIAGNOSIS — I252 Old myocardial infarction: Secondary | ICD-10-CM | POA: Diagnosis not present

## 2018-07-16 DIAGNOSIS — Z7189 Other specified counseling: Secondary | ICD-10-CM | POA: Diagnosis not present

## 2018-07-16 DIAGNOSIS — N170 Acute kidney failure with tubular necrosis: Secondary | ICD-10-CM | POA: Diagnosis not present

## 2018-07-16 DIAGNOSIS — I469 Cardiac arrest, cause unspecified: Secondary | ICD-10-CM | POA: Diagnosis not present

## 2018-07-16 DIAGNOSIS — Z8249 Family history of ischemic heart disease and other diseases of the circulatory system: Secondary | ICD-10-CM

## 2018-07-16 DIAGNOSIS — R252 Cramp and spasm: Secondary | ICD-10-CM | POA: Diagnosis not present

## 2018-07-16 DIAGNOSIS — Z09 Encounter for follow-up examination after completed treatment for conditions other than malignant neoplasm: Secondary | ICD-10-CM

## 2018-07-16 DIAGNOSIS — R609 Edema, unspecified: Secondary | ICD-10-CM

## 2018-07-16 DIAGNOSIS — Z4659 Encounter for fitting and adjustment of other gastrointestinal appliance and device: Secondary | ICD-10-CM

## 2018-07-16 DIAGNOSIS — Z0189 Encounter for other specified special examinations: Secondary | ICD-10-CM

## 2018-07-16 DIAGNOSIS — H409 Unspecified glaucoma: Secondary | ICD-10-CM | POA: Diagnosis present

## 2018-07-16 DIAGNOSIS — I82409 Acute embolism and thrombosis of unspecified deep veins of unspecified lower extremity: Secondary | ICD-10-CM | POA: Diagnosis not present

## 2018-07-16 DIAGNOSIS — E872 Acidosis: Secondary | ICD-10-CM | POA: Diagnosis not present

## 2018-07-16 DIAGNOSIS — G4733 Obstructive sleep apnea (adult) (pediatric): Secondary | ICD-10-CM | POA: Diagnosis present

## 2018-07-16 LAB — HEPATIC FUNCTION PANEL
ALK PHOS: 33 U/L — AB (ref 38–126)
ALT: 9 U/L (ref 0–44)
AST: 16 U/L (ref 15–41)
Albumin: 3.3 g/dL — ABNORMAL LOW (ref 3.5–5.0)
BILIRUBIN INDIRECT: 0.5 mg/dL (ref 0.3–0.9)
Bilirubin, Direct: 0.2 mg/dL (ref 0.0–0.2)
Total Bilirubin: 0.7 mg/dL (ref 0.3–1.2)
Total Protein: 7.8 g/dL (ref 6.5–8.1)

## 2018-07-16 LAB — CBC
HCT: 35.4 % — ABNORMAL LOW (ref 40.0–52.0)
Hemoglobin: 12.4 g/dL — ABNORMAL LOW (ref 13.0–18.0)
MCH: 32.2 pg (ref 26.0–34.0)
MCHC: 35 g/dL (ref 32.0–36.0)
MCV: 91.9 fL (ref 80.0–100.0)
PLATELETS: 264 10*3/uL (ref 150–440)
RBC: 3.85 MIL/uL — ABNORMAL LOW (ref 4.40–5.90)
RDW: 13.6 % (ref 11.5–14.5)
WBC: 8.2 10*3/uL (ref 3.8–10.6)

## 2018-07-16 LAB — PROTIME-INR
INR: 1.54
Prothrombin Time: 18.4 seconds — ABNORMAL HIGH (ref 11.4–15.2)

## 2018-07-16 LAB — BASIC METABOLIC PANEL
Anion gap: 8 (ref 5–15)
BUN: 13 mg/dL (ref 8–23)
CALCIUM: 8.9 mg/dL (ref 8.9–10.3)
CO2: 25 mmol/L (ref 22–32)
CREATININE: 1.16 mg/dL (ref 0.61–1.24)
Chloride: 105 mmol/L (ref 98–111)
GFR calc Af Amer: >60 mL/min (ref >60)
GFR calc non Af Amer: >60 mL/min (ref >60)
GLUCOSE: 118 mg/dL — AB (ref 70–99)
Potassium: 4 mmol/L (ref 3.5–5.1)
Sodium: 138 mmol/L (ref 135–145)

## 2018-07-16 LAB — APTT: APTT: 53 s — AB (ref 24–36)

## 2018-07-16 LAB — ABO/RH: ABO/RH(D): A POS

## 2018-07-16 LAB — DIFFERENTIAL
BASOS PCT: 1 %
Basophils Absolute: 0.1 10*3/uL (ref 0–0.1)
EOS PCT: 5 %
Eosinophils Absolute: 0.4 10*3/uL (ref 0–0.7)
LYMPHS PCT: 16 %
Lymphs Abs: 1.3 10*3/uL (ref 1.0–3.6)
Monocytes Absolute: 0.5 10*3/uL (ref 0.2–1.0)
Monocytes Relative: 6 %
NEUTROS ABS: 5.9 10*3/uL (ref 1.4–6.5)
NEUTROS PCT: 72 %

## 2018-07-16 LAB — TRIGLYCERIDES, BODY FLUIDS: TRIGLYCERIDES FL: 57 mg/dL

## 2018-07-16 MED ORDER — CLOMIPHENE CITRATE 50 MG PO TABS
25.0000 mg | ORAL_TABLET | Freq: Every day | ORAL | Status: DC
  Administered 2018-07-17 – 2018-07-21 (×5): 25 mg via ORAL
  Filled 2018-07-16 (×10): qty 1

## 2018-07-16 MED ORDER — DEXTROSE-NACL 5-0.45 % IV SOLN
INTRAVENOUS | Status: DC
  Administered 2018-07-16 – 2018-07-22 (×10): via INTRAVENOUS

## 2018-07-16 MED ORDER — PNEUMOCOCCAL VAC POLYVALENT 25 MCG/0.5ML IJ INJ: 0.5000 mL | INJECTION | INTRAMUSCULAR | Status: DC | PRN

## 2018-07-16 MED ORDER — SODIUM CHLORIDE 0.9 % IV SOLN
INTRAVENOUS | Status: DC
  Administered 2018-07-17: 10:00:00 via INTRAVENOUS

## 2018-07-16 MED ORDER — LISINOPRIL 10 MG PO TABS
5.0000 mg | ORAL_TABLET | Freq: Every day | ORAL | Status: DC
  Administered 2018-07-16 – 2018-07-21 (×6): 5 mg via ORAL
  Filled 2018-07-16 (×6): qty 1

## 2018-07-16 NOTE — ED Notes (Signed)
Call to 2C 

## 2018-07-16 NOTE — ED Provider Notes (Signed)
[facility] Emergency Department Provider Note   ____________________________________________   First MD Initiated Contact with Patient 07/15/2018 1134     (approximate)  I have reviewed the triage vital signs and the nursing notes.   HISTORY  Chief Complaint Pleural Effusion    HPI Jeff Leonard is a 67 y.o. male who reports he has been sick for about 2 months with what he initially thought was bronchitis.  About 2 weeks ago he went to see his doctor got one set of antibiotics.  He did not get any better so he got another set of antibiotics.  When he still was not any better after that the doctor did a chest x-ray which showed a pleural effusion.  He went yesterday to have a tap but they apparently could not get much out.  CT was done today which showed what appears to be a loculated pleural effusion more consistent with empyema.  Patient was sent to the ER.  Patient is not running a fever is not particularly short of breath he just feels a little kind of off with a little bit of a cough.  Past Medical History:  Diagnosis Date  . Glaucoma   . Hormone disorder   . Hx of pulmonary embolus   . Myocardial infarction (HCC)   . Past heart attack   . Sleep apnea     Patient Active Problem List   Diagnosis Date Noted  . Hemothorax on right 07/20/2018  . Hemoptysis 04/01/2017  . Pulmonary embolism (HCC) 03/24/2017  . Pulmonary emboli (HCC) 03/24/2017  . Morbid obesity (HCC) 05/04/2014  . DVT (deep venous thrombosis) (HCC) 05/04/2014  . Shortness of breath 03/21/2014  . Pulmonary embolism 03/21/2014    Past Surgical History:  Procedure Laterality Date  . SHOULDER SURGERY     left shoulder     Prior to Admission medications   Medication Sig Start Date End Date Taking? Authorizing Provider  albuterol (PROVENTIL HFA;VENTOLIN HFA) 108 (90 Base) MCG/ACT inhaler Inhale 2 puffs into the lungs every 6 (six) hours as needed for wheezing or shortness of breath.  04/02/17  Yes Wieting, Richard, MD  cetirizine (ZYRTEC) 10 MG tablet Take 10 mg by mouth daily. 07/08/18  Yes [provider]  clomiPHENE (CLOMID) 50 MG tablet One half tab daily Patient taking differently: Take 25 mg by mouth daily.  06/14/18  Yes Stoioff, Verna Czech, MD  cyclobenzaprine (FLEXERIL) 5 MG tablet Take 5 mg by mouth at bedtime as needed. 04/15/18  Yes [provider]  dorzolamide (TRUSOPT) 2 % ophthalmic solution Place 1 drop into both eyes daily.   Yes [provider]  latanoprost (XALATAN) 0.005 % ophthalmic solution Place 1 drop into both eyes at bedtime.  02/13/17  Yes [provider]  lisinopril (PRINIVIL,ZESTRIL) 5 MG tablet Take 5 mg by mouth daily.  05/11/18  Yes [provider]  timolol (TIMOPTIC) 0.5 % ophthalmic solution Place 1 drop into both eyes daily.  02/14/17  Yes [provider]  Vitamin D, Ergocalciferol, (DRISDOL) 50000 units CAPS capsule Take 50,000 Units by mouth every 7 (seven) days.  03/23/18  Yes [provider]  XARELTO 20 MG TABS tablet Take 20 mg by mouth every evening.  06/01/18  Yes [provider]    Allergies Patient has no known allergies.  Family History  Problem Relation Age of Onset  . Heart disease Father 40  . Hypertension Mother   . Cancer Mother  Breast cancer   . Asthma Maternal Grandfather   . Cancer Paternal Uncle        lung    Social History Social History   Tobacco Use  . Smoking status: Never Smoker  . Smokeless tobacco: Never Used  Substance Use Topics  . Alcohol use: No  . Drug use: No    Review of Systems  Constitutional: No fever/chills Eyes: No visual changes. ENT: No sore throat. Cardiovascular: Denies chest pain. Respiratory: Slight shortness of breath. Gastrointestinal: No abdominal pain.  No nausea, no vomiting.  No diarrhea.  No constipation. Genitourinary: Negative for dysuria. Musculoskeletal: Negative for back pain. Skin: Negative  for rash. Neurological: Negative for headaches, focal weakness  ____________________________________________   PHYSICAL EXAM:  VITAL SIGNS: ED Triage Vitals  Enc Vitals Group     BP Jul 22, 2018 1103 116/74     Pulse Rate 22-Jul-2018 1103 79     Resp 08/06/2018 1103 18     Temp July 22, 2018 1103 (!) 97.5 F (36.4 C)     Temp Source Jul 22, 2018 1103 Oral     SpO2 07/23/2018 1103 94 %     Weight 08/06/2018 1104 284 lb 13.4 oz (129.2 kg)     Height July 22, 2018 1104 6\' 1"  (1.854 m)     Head Circumference --      Peak Flow --      Pain Score 22-Jul-2018 1104 0     Pain Loc --      Pain Edu? --      Excl. in GC? --     Constitutional: Alert and oriented. Well appearing and in no acute distress. Eyes: Conjunctivae are normal.  Head: Atraumatic. Nose: No congestion/rhinnorhea. Mouth/Throat: Mucous membranes are moist.  Oropharynx non-erythematous. Neck: No stridor.   Cardiovascular: Normal rate, regular rhythm. Grossly normal heart sounds.  Good peripheral circulation. Respiratory: Normal respiratory effort.  No retractions. Lungs CTAB!! Gastrointestinal: Soft and nontender. No distention. No abdominal bruits. No CVA tenderness. Musculoskeletal: No lower extremity tenderness 1+ edema.   Neurologic:  Normal speech and language. No gross focal neurologic deficits are appreciated. No gait instability. Skin:  Skin is warm, dry and intact. No rash noted. Psychiatric: Mood and affect are normal. Speech and behavior are normal.  ____________________________________________   LABS (all labs ordered are listed, but only abnormal results are displayed)  Labs Reviewed  BASIC METABOLIC PANEL - Abnormal; Notable for the following components:      Result Value   Glucose, Bld 118 (*)    All other components within normal limits  CBC - Abnormal; Notable for the following components:   RBC 3.85 (*)    Hemoglobin 12.4 (*)    HCT 35.4 (*)    All other components within normal limits  HEPATIC FUNCTION PANEL -  Abnormal; Notable for the following components:   Albumin 3.3 (*)    Alkaline Phosphatase 33 (*)    All other components within normal limits  PROTIME-INR - Abnormal; Notable for the following components:   Prothrombin Time 18.4 (*)    All other components within normal limits  APTT - Abnormal; Notable for the following components:   aPTT 53 (*)    All other components within normal limits  DIFFERENTIAL  HIV ANTIBODY (ROUTINE TESTING W REFLEX)  PROTIME-INR  APTT  TYPE AND SCREEN  PREPARE RBC (CROSSMATCH)  ABO/RH   ____________________________________________  EKG  EKG read and interpreted by me shows normal sinus rhythm rate of 78 left axis no acute ST-T wave changes  ____________________________________________  RADIOLOGY  ED MD interpretation: Chest x-ray and CT appear to show a empyema this loculated.  I reviewed the films with interesting to note that there is 2 different colors of densities of fluid in CT scan.  Official radiology report(s): Ct Chest Wo Contrast  Result Date: 07/26/18 CLINICAL DATA:  Shortness of Breath and abnormal chest x-ray EXAM: CT CHEST WITHOUT CONTRAST TECHNIQUE: Multidetector CT imaging of the chest was performed following the standard protocol without IV contrast. COMPARISON:  Chest x-ray from the previous day. FINDINGS: Cardiovascular: Somewhat limited due to the lack of IV contrast. The thoracic aorta demonstrates a normal branching pattern without aneurysmal dilatation or significant atherosclerotic calcifications. No cardiac enlargement is seen. Mild coronary calcifications are noted. Mediastinum/Nodes: The thoracic inlet is within normal limits. No significant hilar or mediastinal adenopathy is identified at this time. The esophagus is within normal limits. Lungs/Pleura: The left lung is well aerated without focal infiltrate or sizable effusion. On the right there is a large loculated pleural effusion identified with increased attenuation  identified within. These changes are most consistent with an empyema. Some upper lobe consolidation is noted adjacent to the fluid collection. These changes are similar to that seen on recent chest x-ray but new from a prior exam of 12/30/2017 Upper Abdomen: Visualized upper abdomen is within normal limits. Musculoskeletal: Degenerative changes of the thoracic spine are noted. No definitive rib fracture is seen. IMPRESSION: Changes consistent with large loculated pleural effusion likely representing an empyema on the right. This would likely benefit from large bore chest tube placement with possible lytic therapy. Associated right upper lobe consolidation is noted. These results will be called to the ordering clinician or representative by the Radiologist Assistant, and communication documented in the PACS or zVision Dashboard. Electronically Signed   By: Alcide Clever M.D.   On: 26-Jul-2018 10:23    ____________________________________________   PROCEDURES  Procedure(s) performed:   Procedures  Critical Care performed: Critical care time 15 minutes.  This includes discussing the patient with the surgeon and the cardiothoracic surgeon and the patient.  ____________________________________________   INITIAL IMPRESSION / ASSESSMENT AND PLAN / ED COURSE  Patient has what appears to be a loculated hemothorax.  Dr. Inez Catalina will admit the patient putting a small chest tube and plan on doing TPA and the chest tube.         ____________________________________________   FINAL CLINICAL IMPRESSION(S) / ED DIAGNOSES  Final diagnoses:  Hemothorax on right     ED Discharge Orders    None       Note:  This document was prepared using Dragon voice recognition software and may include unintentional dictation errors.    Arnaldo Natal, MD July 26, 2018 2123

## 2018-07-16 NOTE — Progress Notes (Signed)
Patient ID: Jeff Leonard, male   DOB: October 24, 1950, 67 y.o.   MRN: [id number]  Chief Complaint  Patient presents with  . Pleural Effusion    Referred By Dr. Rip Harbour Reason for Referral management of right hemothorax  HPI Location, Quality, Duration, Severity, Timing, Context, Modifying Factors, Associated Signs and Symptoms.  Jeff Leonard is a 67 y.o. male.  He was in his usual state of health until couple weeks ago when he began experiencing increasing shortness of breath and cough.  He presented to his primary care physician where chest x-ray was made.  The results of that are not known to me but he was placed on antibiotics and inhaled bronchodilators.  Over the course of the next few weeks he did not improve and ultimately a repeat chest x-ray was made.  He was subsequently sent to see Dr. Ashby Dawes and pulmonary medicine.  Dr. Ashby Dawes performed an ultrasound-guided thoracentesis yesterday.  He could only withdraw 15 cc of old blood.  There is no sign of an empyema.  Patient then had an outpatient CT scan of the chest today and once those results were obtained he was sent to the emergency department for further evaluation.  I was subsequently called to evaluate the patient and for possible admission.  He states that he has been on Xarelto for the last several years.  In 2014 he had a pulmonary embolism secondary to lower extremity DVT.  In 2017 he again had a pulmonary embolism and has been on Xarelto ever since.  He denies any fevers or chills.  His cough is been productive of occasionally white sputum but there is nothing to suggest a pneumonia or an empyema.  He has an extensive work-up for his underlying coagulopathy.  He also has a history of sleep apnea and potentially pulmonary hypertension.   Past Medical History:  Diagnosis Date  . Glaucoma   . Hormone disorder   . Hx of pulmonary embolus   . Myocardial infarction ([place])   . Past heart attack   . Sleep apnea     Past  Surgical History:  Procedure Laterality Date  . SHOULDER SURGERY     left shoulder     Family History  Problem Relation Age of Onset  . Heart disease Father 27  . Hypertension Mother   . Cancer Mother        Breast cancer   . Asthma Maternal Grandfather   . Cancer Paternal Uncle        lung    Social History Social History   Tobacco Use  . Smoking status: Never Smoker  . Smokeless tobacco: Never Used  Substance Use Topics  . Alcohol use: No  . Drug use: No    No Known Allergies  No current facility-administered medications for this encounter.    Current Outpatient Medications  Medication Sig Dispense Refill  . albuterol (PROVENTIL HFA;VENTOLIN HFA) 108 (90 Base) MCG/ACT inhaler Inhale 2 puffs into the lungs every 6 (six) hours as needed for wheezing or shortness of breath. 1 Inhaler 0  . Alcohol Swabs (B-D SINGLE USE SWABS REGULAR) PADS     . azithromycin (ZITHROMAX) 250 MG tablet Take 250 mg by mouth as directed.    . benzonatate (TESSALON) 100 MG capsule Take 100 mg by mouth daily as needed.  0  . Blood Glucose Calibration (TRUE METRIX LEVEL 1) Low SOLN     . Blood Glucose Monitoring Suppl (TRUE METRIX AIR GLUCOSE METER) w/Device KIT     .  Brinzolamide-Brimonidine (SIMBRINZA) 1-0.2 % SUSP INSTILL 1 DROP IN BOTH EYES BID    . cetirizine (ZYRTEC) 10 MG tablet Take 10 mg by mouth daily.    . clomiPHENE (CLOMID) 50 MG tablet One half tab daily 15 tablet 1  . cyclobenzaprine (FLEXERIL) 5 MG tablet Take 5 mg by mouth at bedtime as needed.  0  . dorzolamidel-timolol (COSOPT) 22.3-6.8 MG/ML SOLN ophthalmic solution dorzolamide-timolol (PF) 2 %-0.5 % eye drops in a dropperette    . gabapentin (NEURONTIN) 300 MG capsule Take 300 mg by mouth.    Marland Kitchen HYDROcodone-acetaminophen (NORCO/VICODIN) 5-325 MG tablet Take 1 tablet by mouth 3 (three) times daily as needed for moderate pain.    Marland Kitchen latanoprost (XALATAN) 0.005 % ophthalmic solution PLACE 1 DROP INTO BOTH EYES TWICE A DAY  6  .  lisinopril (PRINIVIL,ZESTRIL) 5 MG tablet     . timolol (TIMOPTIC) 0.5 % ophthalmic solution INSTILL 1 DROP INTO BOTH EYES EVERY MORNING  6  . TRUE METRIX BLOOD GLUCOSE TEST test strip     . TRUEPLUS LANCETS 33G MISC     . Vitamin D, Ergocalciferol, (DRISDOL) 50000 units CAPS capsule TAKE ONE CAPSULE BY MOUTH ONE TIME PER WEEK  0  . XARELTO 20 MG TABS tablet         Review of Systems A complete review of systems was asked and was negative except for the following positive findings shortness of breath, cough, poor appetite.  Blood pressure 107/77, pulse 73, temperature (!) 97.5 F (36.4 C), temperature source Oral, resp. rate 19, height '6\' 1"'$  (1.854 m), weight 129.2 kg, SpO2 96 %.  Physical Exam CONSTITUTIONAL:  Pleasant, well-developed, well-nourished, and in no acute distress.  He was able to speak in complete sentences.  He did not appear at all in any distress. EYES: Pupils equal and reactive to light, Sclera non-icteric EARS, NOSE, MOUTH AND THROAT:  The oropharynx was clear.  Dentition is good repair.  Oral mucosa pink and moist. LYMPH NODES:  Lymph nodes in the neck and axillae were normal RESPIRATORY:  Lungs were clear.  Normal respiratory effort without pathologic use of accessory muscles of respiration.  Surprisingly his lungs were in the clear bilaterally.  There is perhaps a slight decrease at the right base CARDIOVASCULAR: Heart was regular without murmurs.  There were no carotid bruits. GI: The abdomen was soft, nontender, and nondistended. There were no palpable masses. There was no hepatosplenomegaly. There were normal bowel sounds in all quadrants. GU:  Rectal deferred.   MUSCULOSKELETAL:  Normal muscle strength and tone.  No clubbing or cyanosis.   SKIN:  There were no pathologic skin lesions.  There were no nodules on palpation. NEUROLOGIC:  Sensation is normal.  Cranial nerves are grossly intact. PSYCH:  Oriented to person, place and time.  Mood and affect are  normal.  Data Reviewed CT scans and chest x-rays  I have personally reviewed the patient's imaging, laboratory findings and medical records.    Assessment    I have discussed his care with Dr. Ashby Dawes.  I have also discussed his care with Dr. Leotis Pain.  It would appear that the patient has a spontaneous right-sided hemothorax.  Perhaps this may have been related to some coughing episodes that he is experienced over the last several weeks.  He did not take any Xarelto for the last 5 days but did take some this morning.    Plan    I explained to the patient in great detail that  I believe he most likely has a hemothorax.  I would like to bring the patient into the hospital and consider placing a percutaneous drain with intrapleural thrombolytics.  I have also asked Dr. Lucky Cowboy to consider an inferior vena caval filter.  We will also ask the hospitalist to assist [place] in the management of his multiple medical problems.       Nestor Lewandowsky, MD 07/02/2018, 2:10 PM   Patient ID: Jeff Leonard, male   DOB: November 12, 1950, 67 y.o.   MRN: [id number]

## 2018-07-16 NOTE — ED Notes (Signed)
Consulting provider at bedside

## 2018-07-16 NOTE — ED Triage Notes (Signed)
Says he seed [facility].  They sent him for out paitnet ct today.  Then called him and told him to come to ED to have fluid taken off lung.  Thinks it is right side.  Pt says they tried to do thoracentese yesterday and couldn't because it was too thick

## 2018-07-16 NOTE — Consult Note (Signed)
[facility] NAME: Jeff Leonard    MR#:  [id number]  DATE OF BIRTH:  [birth year]  DATE OF ADMISSION:  06/26/2018  PRIMARY CARE PHYSICIAN: Casilda Carls, MD (Inactive)   CONSULT REQUESTING/REFERRING PHYSICIAN: Dr. Genevive Bi  REASON FOR CONSULT: Anticoagulation with hemothorax  CHIEF COMPLAINT:   Chief Complaint  Patient presents with  . Pleural Effusion    HISTORY OF PRESENT ILLNESS:  Jeff Leonard  is a 67 y.o. male with a known history of CAD, PE on Xarelto was recently seen in the pulmonary office by Dr. Juanell Fairly.  Found to have right pleural effusion and had thoracentesis.  After this patient had worsening shortness of breath and had a CT scan which showed loculated right pleural effusion concerning for hemothorax.  Patient is being admitted for further work-up and treatment.  Plan is to stop Xarelto and have a drain placed with possible thrombolytic use.  Discussed with Dr. Genevive Bi. Patient had initial episode of pulmonary embolism in 2014.  Treated for 8 months and anticoagulation stopped.  Had bilateral lower extremity DVT with pulmonary embolism again 2017 and started on Xarelto. He does complain of chronic shortness of breath.  No chest pain.  Does not smoke.  PAST MEDICAL HISTORY:   Past Medical History:  Diagnosis Date  . Glaucoma   . Hormone disorder   . Hx of pulmonary embolus   . Myocardial infarction ([place])   . Past heart attack   . Sleep apnea     PAST SURGICAL HISTOIRY:   Past Surgical History:  Procedure Laterality Date  . SHOULDER SURGERY     left shoulder     SOCIAL HISTORY:   Social History   Tobacco Use  . Smoking status: Never Smoker  . Smokeless tobacco: Never Used  Substance Use Topics  . Alcohol use: No    FAMILY HISTORY:   Family History  Problem Relation Age of Onset  . Heart disease Father 43  . Hypertension Mother   . Cancer Mother        Breast cancer   . Asthma Maternal Grandfather   .  Cancer Paternal Uncle        lung    DRUG ALLERGIES:  No Known Allergies  REVIEW OF SYSTEMS:   ROS  CONSTITUTIONAL: No fever, fatigue or weakness.  EYES: No blurred or double vision.  EARS, NOSE, AND THROAT: No tinnitus or ear pain.  RESPIRATORY: No cough, shortness of breath, wheezing or hemoptysis.  CARDIOVASCULAR: No chest pain, orthopnea, edema.  GASTROINTESTINAL: No nausea, vomiting, diarrhea or abdominal pain.  GENITOURINARY: No dysuria, hematuria.  ENDOCRINE: No polyuria, nocturia,  HEMATOLOGY: No anemia, easy bruising or bleeding SKIN: No rash or lesion. MUSCULOSKELETAL: No joint pain or arthritis.   NEUROLOGIC: No tingling, numbness, weakness.  PSYCHIATRY: No anxiety or depression.   MEDICATIONS AT HOME:   Prior to Admission medications   Medication Sig Start Date End Date Taking? Authorizing Provider  albuterol (PROVENTIL HFA;VENTOLIN HFA) 108 (90 Base) MCG/ACT inhaler Inhale 2 puffs into the lungs every 6 (six) hours as needed for wheezing or shortness of breath. 04/02/17   Loletha Grayer, MD  Alcohol Swabs (B-D SINGLE USE SWABS REGULAR) PADS  03/23/18   [provider]  azithromycin (ZITHROMAX) 250 MG tablet Take 250 mg by mouth as directed. 07/08/18   [provider]  benzonatate (TESSALON) 100 MG capsule Take 100 mg by mouth daily as needed. 06/01/18   [provider]  Blood Glucose Calibration (TRUE METRIX LEVEL 1) Low SOLN  03/23/18   [provider]  Blood Glucose Monitoring Suppl (TRUE METRIX AIR GLUCOSE METER) w/Device KIT  03/23/18   [provider]  Brinzolamide-Brimonidine (SIMBRINZA) 1-0.2 % SUSP INSTILL 1 DROP IN BOTH EYES BID 07/13/15   [provider]  cetirizine (ZYRTEC) 10 MG tablet Take 10 mg by mouth daily. 07/08/18   [provider]  clomiPHENE (CLOMID) 50 MG tablet One half tab daily 06/14/18   Stoioff, Ronda Fairly, MD  cyclobenzaprine (FLEXERIL) 5 MG tablet Take 5 mg by mouth at bedtime as needed.  04/15/18   [provider]  dorzolamidel-timolol (COSOPT) 22.3-6.8 MG/ML SOLN ophthalmic solution dorzolamide-timolol (PF) 2 %-0.5 % eye drops in a dropperette    [provider]  gabapentin (NEURONTIN) 300 MG capsule Take 300 mg by mouth. 12/21/14   [provider]  HYDROcodone-acetaminophen (NORCO/VICODIN) 5-325 MG tablet Take 1 tablet by mouth 3 (three) times daily as needed for moderate pain.    [provider]  latanoprost (XALATAN) 0.005 % ophthalmic solution PLACE 1 DROP INTO BOTH EYES TWICE A DAY 02/13/17   [provider]  lisinopril (PRINIVIL,ZESTRIL) 5 MG tablet  05/11/18   [provider]  timolol (TIMOPTIC) 0.5 % ophthalmic solution INSTILL 1 DROP INTO BOTH EYES EVERY MORNING 02/14/17   [provider]  TRUE METRIX BLOOD GLUCOSE TEST test strip  03/23/18   [provider]  TRUEPLUS LANCETS 33G [place]  03/23/18   [provider]  Vitamin D, Ergocalciferol, (DRISDOL) 50000 units CAPS capsule TAKE ONE CAPSULE BY MOUTH ONE TIME PER WEEK 03/23/18   [provider]  XARELTO 20 MG TABS tablet  06/01/18   [provider]      VITAL SIGNS:  Blood pressure 107/77, pulse 73, temperature (!) 97.5 F (36.4 C), temperature source Oral, resp. rate 19, height '6\' 1"'$  (1.854 m), weight 129.2 kg, SpO2 96 %.  PHYSICAL EXAMINATION:  GENERAL:  67 y.o.-year-old patient lying in the bed with no acute distress.  EYES: Pupils equal, round, reactive to light and accommodation. No scleral icterus. Extraocular muscles intact.  HEENT: Head atraumatic, normocephalic. Oropharynx and nasopharynx clear.  NECK:  Supple, no jugular venous distention. No thyroid enlargement, no tenderness.  LUNGS: Normal breath sounds bilaterally, no wheezing, rales,rhonchi or crepitation. No use of accessory muscles of respiration.  CARDIOVASCULAR: S1, S2 normal. No murmurs, rubs, or gallops.  ABDOMEN: Soft, nontender, nondistended. Bowel sounds  present. No organomegaly or mass.  EXTREMITIES: No pedal edema, cyanosis, or clubbing.  NEUROLOGIC: Cranial nerves II through XII are intact. Muscle strength 5/5 in all extremities. Sensation intact. Gait not checked.  PSYCHIATRIC: The patient is alert and oriented x 3.  SKIN: No obvious rash, lesion, or ulcer.   LABORATORY PANEL:   CBC Recent Labs  Lab 07/03/2018 1111  WBC 8.2  HGB 12.4*  HCT 35.4*  PLT 264   ------------------------------------------------------------------------------------------------------------------  Chemistries  Recent Labs  Lab 07/10/2018 1111  NA 138  K 4.0  CL 105  CO2 25  GLUCOSE 118*  BUN 13  CREATININE 1.16  CALCIUM 8.9  AST 16  ALT 9  ALKPHOS 33*  BILITOT 0.7   ------------------------------------------------------------------------------------------------------------------  Cardiac Enzymes No results for input(s): TROPONINI in the last 168 hours. ------------------------------------------------------------------------------------------------------------------  RADIOLOGY:  X-ray Chest Pa Or Ap  Result Date: 07/15/2018 CLINICAL DATA:  Status post thoracentesis. EXAM: CHEST  1 VIEW COMPARISON:  Radiographs of December 30, 2017. FINDINGS: The heart size  and mediastinal contours are within normal limits. Left lung is clear. Large loculated pleural effusion is noted superiorly and laterally in right hemithorax. No pneumothorax is noted. The visualized skeletal structures are unremarkable. IMPRESSION: Large loculated pleural effusion seen superiorly and laterally in right hemithorax. Electronically Signed   By: Marijo Conception, M.D.   On: 07/15/2018 13:33   Ct Chest Wo Contrast  Result Date: 06/26/2018 CLINICAL DATA:  Shortness of Breath and abnormal chest x-ray EXAM: CT CHEST WITHOUT CONTRAST TECHNIQUE: Multidetector CT imaging of the chest was performed following the standard protocol without IV contrast. COMPARISON:  Chest x-ray from the previous  day. FINDINGS: Cardiovascular: Somewhat limited due to the lack of IV contrast. The thoracic aorta demonstrates a normal branching pattern without aneurysmal dilatation or significant atherosclerotic calcifications. No cardiac enlargement is seen. Mild coronary calcifications are noted. Mediastinum/Nodes: The thoracic inlet is within normal limits. No significant hilar or mediastinal adenopathy is identified at this time. The esophagus is within normal limits. Lungs/Pleura: The left lung is well aerated without focal infiltrate or sizable effusion. On the right there is a large loculated pleural effusion identified with increased attenuation identified within. These changes are most consistent with an empyema. Some upper lobe consolidation is noted adjacent to the fluid collection. These changes are similar to that seen on recent chest x-ray but new from a prior exam of 12/30/2017 Upper Abdomen: Visualized upper abdomen is within normal limits. Musculoskeletal: Degenerative changes of the thoracic spine are noted. No definitive rib fracture is seen. IMPRESSION: Changes consistent with large loculated pleural effusion likely representing an empyema on the right. This would likely benefit from large bore chest tube placement with possible lytic therapy. Associated right upper lobe consolidation is noted. These results will be called to the ordering clinician or representative by the Radiologist Assistant, and communication documented in the PACS or zVision Dashboard. Electronically Signed   By: Inez Catalina M.D.   On: 07/09/2018 10:23   US Thoracentesis Asp Pleural Space W/img Guide  Result Date: 07/15/2018 Laverle Hobby, MD     07/10/2018  8:27 AM PROCEDURE NOTE: THORACENTESIS Date: 07/15/2018, MRN# [id number] [place] Indication: Right pleural effusion A time-out was completed verifying correct patient, procedure, site, positioning, and implant(s) or special equipment if applicable. Ultrasound guidance  was used and appropriate fluid pocket was identified and marked.  Patient was positioned, prepped and draped in usual sterile fashion. 1% Lidocaine was used to anesthetize the area. Under ultrasound guidance a right upper lobe loculated pleural effusion was noted, this effusion appeared to be high density, considerably higher than a typical fluid density. The site was anesthetized with 1% topical lidocaine.  The thoracentesis kit was used, the trocar was introduced over the tube.  It was advanced until fluid was aspirated into the syringe, the fluid appeared to be grossly bloody.  Approximately 15 cc could be drained, further fluid could not be drained.  Therefore the procedure was stopped, obtained fluid was sent to the lab for evaluation. A chest xray was ordered to evaluate for pneumothorax. Total Fluid Removed: 15 cc of grossly bloody fluid. DDx: Loculated empyema vs. Hemothorax. --Will check CT chest. Patient tolerated the procedure well and there were no complications noted. Marda Stalker, M.D., F.C.C.P. Board Certified in Internal Medicine, Pulmonary Medicine, [place], and Sleep Medicine. [facility] Office Number: [phone]    EKG:   Orders placed or performed during the hospital encounter of 06/29/2018  . EKG 12-Lead  .  EKG 12-Lead    IMPRESSION AND PLAN:   *Right loculated pleural effusion, likely hemothorax.  Discussed with Dr. Genevive Bi.  Patient will have drain placed by IR.  Hold Xarelto.  Likely thrombolytic use.  *History of recurrent pulmonary embolism and DVT.  Vascular surgery consulted.  IVC filter to be placed tomorrow.  Hold anticoagulation.  *DVT prophylaxis with SCDs.  All the records are reviewed and case discussed with Consulting provider. Management plans discussed with the patient, family and they are in agreement.  CODE STATUS: FULL CODE  TOTAL TIME TAKING CARE OF THIS PATIENT: 35 minutes.    Leia Alf Rogers Ditter M.D on  07/10/2018 at 3:09 PM  Between 7am to 6pm - Pager - [phone]  After 6pm go to [email or web address] - password [email or web address] [facility]  Office  [phone]  CC: Primary care Physician: Casilda Carls, MD (Inactive)  Note: This dictation was prepared with Dragon dictation along with smaller phrase technology. Any transcriptional errors that result from this process are unintentional.

## 2018-07-16 NOTE — Progress Notes (Signed)
Advance care planning  Purpose of Encounter CODE STATUS discussion and discussed regarding hemothorax and anticoagulation  Parties in Attendance Patient, wife at bedside and brother  Patients Decisional capacity Patient is alert and oriented.  Able to make medical decisions.  Discussed with patient regarding his hemothorax, plan.  Holding Xarelto.  All questions answered.  Patient does not have a documented healthcare power of attorney.  He would like brother and wife involved in medical decision making if he is unable to.  Encouraged to have documentation done during hospitalization.  Wants to be full code.  Orders entered.  Time spent - 17 minutes

## 2018-07-16 NOTE — Addendum Note (Signed)
Addended by: Shane Crutch on: 08-01-2018 08:31 AM   Modules accepted: Orders

## 2018-07-16 NOTE — Consult Note (Signed)
[facility] Vascular Consult Note  MRN : [id number]  Jeff Leonard is a 67 y.o. ([birth year]) male who presents with chief complaint of  Chief Complaint  Patient presents with  . Pleural Effusion   History of Present Illness:  The patient is a 67 year old male with a past medical history of DVT, PE, morbid obesity, chronic cough, MI, sleep apnea and pulmonary hypertension who endorses a history of progressively worsening dyspnea and shortness of breath the last few weeks.  The patient was treated with antibiotics and inhaled bronchodilators.  The patient has been on Xarelto for the past several years due to DVT and subsequent PE - one in 2014 and the other in 2017.   The patient endorses undergoing a thoracentesis yesterday however states that the fluid in his lungs was "too thick" to be removed.  Per EPIC notes approximately feet 15cc of fluid was removed the patient underwent a CT of the chest and was found to have a probable hemothorax.  The patient denies fever, nausea vomiting.  He notes that his cough has been productive with intermittent white sputum but denies any blood.  He denies any chest pain.  The patient denies any bilateral lower extremity pain or swelling.  Vascular surgery was consulted by Dr. Genevive Bi in regard to the possibility of placing an IVC filter now that the patient is unable to remain on oral anticoagulation.  No current facility-administered medications for this encounter.    Current Outpatient Medications  Medication Sig Dispense Refill  . albuterol (PROVENTIL HFA;VENTOLIN HFA) 108 (90 Base) MCG/ACT inhaler Inhale 2 puffs into the lungs every 6 (six) hours as needed for wheezing or shortness of breath. 1 Inhaler 0  . Alcohol Swabs (B-D SINGLE USE SWABS REGULAR) PADS     . azithromycin (ZITHROMAX) 250 MG tablet Take 250 mg by mouth as directed.    . benzonatate (TESSALON) 100 MG capsule Take 100 mg by mouth daily as needed.  0  . Blood  Glucose Calibration (TRUE METRIX LEVEL 1) Low SOLN     . Blood Glucose Monitoring Suppl (TRUE METRIX AIR GLUCOSE METER) w/Device KIT     . Brinzolamide-Brimonidine (SIMBRINZA) 1-0.2 % SUSP INSTILL 1 DROP IN BOTH EYES BID    . cetirizine (ZYRTEC) 10 MG tablet Take 10 mg by mouth daily.    . clomiPHENE (CLOMID) 50 MG tablet One half tab daily 15 tablet 1  . cyclobenzaprine (FLEXERIL) 5 MG tablet Take 5 mg by mouth at bedtime as needed.  0  . dorzolamidel-timolol (COSOPT) 22.3-6.8 MG/ML SOLN ophthalmic solution dorzolamide-timolol (PF) 2 %-0.5 % eye drops in a dropperette    . gabapentin (NEURONTIN) 300 MG capsule Take 300 mg by mouth.    Marland Kitchen HYDROcodone-acetaminophen (NORCO/VICODIN) 5-325 MG tablet Take 1 tablet by mouth 3 (three) times daily as needed for moderate pain.    Marland Kitchen latanoprost (XALATAN) 0.005 % ophthalmic solution PLACE 1 DROP INTO BOTH EYES TWICE A DAY  6  . lisinopril (PRINIVIL,ZESTRIL) 5 MG tablet     . timolol (TIMOPTIC) 0.5 % ophthalmic solution INSTILL 1 DROP INTO BOTH EYES EVERY MORNING  6  . TRUE METRIX BLOOD GLUCOSE TEST test strip     . TRUEPLUS LANCETS 33G MISC     . Vitamin D, Ergocalciferol, (DRISDOL) 50000 units CAPS capsule TAKE ONE CAPSULE BY MOUTH ONE TIME PER WEEK  0  . XARELTO 20 MG TABS tablet      Past Medical History:  Diagnosis Date  .  Glaucoma   . Hormone disorder   . Hx of pulmonary embolus   . Myocardial infarction ([place])   . Past heart attack   . Sleep apnea    Past Surgical History:  Procedure Laterality Date  . SHOULDER SURGERY     left shoulder    Social History Social History   Tobacco Use  . Smoking status: Never Smoker  . Smokeless tobacco: Never Used  Substance Use Topics  . Alcohol use: No  . Drug use: No   Family History Family History  Problem Relation Age of Onset  . Heart disease Father 85  . Hypertension Mother   . Cancer Mother        Breast cancer   . Asthma Maternal Grandfather   . Cancer Paternal Uncle        lung   The patient denies any family history of peripheral artery disease, venous disease or renal disease.  No Known Allergies   REVIEW OF SYSTEMS (Negative unless checked)  Constitutional: _0 Weight loss  _1 Fever  _2 Chills Cardiac: _3 Chest pain   _4 Chest pressure   _5 Palpitations   _6 Shortness of breath when laying flat   _7 Shortness of breath at rest   _8 Shortness of breath with exertion. Vascular:  _9 Pain in legs with walking   _10 Pain in legs at rest   _11 Pain in legs when laying flat   _12 Claudication   _13 Pain in feet when walking  _14 Pain in feet at rest  _15 Pain in feet when laying flat   _16 History of DVT   _17 Phlebitis   _18 Swelling in legs   _19 Varicose veins   _20 Non-healing ulcers Pulmonary:   _21 Uses home oxygen   _22 Productive cough   _23 Hemoptysis   _24 Wheeze  _25 COPD   _26 Asthma Neurologic:  _27 Dizziness  _28 Blackouts   _29 Seizures   _30 History of stroke   _31 History of TIA  _32 Aphasia   _33 Temporary blindness   _34 Dysphagia   _35 Weakness or numbness in arms   _36 Weakness or numbness in legs Musculoskeletal:  _37 Arthritis   _38 Joint swelling   _39 Joint pain   _40 Low back pain Hematologic:  _41 Easy bruising  _42 Easy bleeding   _43 Hypercoagulable state   _44 Anemic  _45 Hepatitis Gastrointestinal:  _46 Blood in stool   _47 Vomiting blood  _48 Gastroesophageal reflux/heartburn   _49 Difficulty swallowing. Genitourinary:  _50 Chronic kidney disease   _51 Difficult urination  _52 Frequent urination  _53 Burning with urination   _54 Blood in urine Skin:  _55 Rashes   _56 Ulcers   _57 Wounds Psychological:  _58 History of anxiety   _59  History of major depression.  Physical Examination  Vitals:   07/15/2018 1104 07/08/2018 1200 07/02/2018 1230 07/15/2018 1300  BP:  113/77 107/77   Pulse:  78 65 73  Resp:  15 (!) 9 19  Temp:      TempSrc:      SpO2:  100% 100% 96%  Weight: 129.2 kg     Height: _60  (1.854 m)      Body mass index is 37.58 kg/m. Gen:  WD/WN, NAD Head: [place]/AT, No temporalis wasting. Prominent temp pulse not noted. Ear/Nose/Throat:  Hearing grossly intact, nares w/o erythema or drainage, oropharynx w/o Erythema/Exudate Eyes: Sclera non-icteric, conjunctiva clear Neck: Trachea midline.  No JVD.  Pulmonary:  Good air movement, respirations not labored, equal bilaterally.  Cardiac: RRR, normal S1, S2. Vascular:  Vessel Right Left  Radial Palpable Palpable  Ulnar Palpable Palpable  Brachial Palpable Palpable  Carotid Palpable, without bruit Palpable, without bruit  Aorta Not palpable N/A  Femoral Palpable Palpable  Popliteal Palpable Palpable  PT Palpable Palpable  DP Palpable Palpable   Gastrointestinal: soft, non-tender/non-distended. No guarding/reflex.  Musculoskeletal: M/S 5/5 throughout.  Extremities without ischemic changes.  No deformity or atrophy. No edema. Neurologic: Sensation grossly intact in extremities.  Symmetrical.  Speech is fluent. Motor exam as listed above. Psychiatric: Judgment intact, Mood & affect appropriate for pt's clinical situation. Dermatologic: No rashes or ulcers noted.  No cellulitis or open wounds. Lymph : No Cervical, Axillary, or Inguinal lymphadenopathy.  CBC Lab Results  Component Value Date   WBC 8.2 07/19/2018   HGB 12.4 (L) 07/18/2018   HCT 35.4 (L) 06/27/2018   MCV 91.9 06/30/2018   PLT 264 06/21/2018   BMET    Component Value Date/Time   NA 138 06/27/2018 1111   NA 137 07/20/2013 1318   K 4.0 06/23/2018 1111   K 4.5 07/20/2013 1318   CL 105 06/25/2018 1111   CL 105 07/20/2013 1318   CO2 25 07/13/2018 1111   CO2 27 07/20/2013 1318   GLUCOSE 118 (H) 06/30/2018 1111   GLUCOSE 100 (H) 07/20/2013 1318   BUN 13 07/13/2018 1111   BUN 16 07/20/2013 1318   CREATININE 1.16 07/07/2018 1111   CREATININE 1.18 07/21/2013 0511   CALCIUM 8.9 06/25/2018 1111   CALCIUM 9.3 07/20/2013 1318   GFRNONAA >60 06/22/2018 1111   GFRNONAA >60 07/21/2013 0511   GFRAA >60 07/05/2018 1111   GFRAA >60 07/21/2013 0511   Estimated Creatinine Clearance: 88.2 mL/min (by C-G formula  based on SCr of 1.16 mg/dL).  COAG Lab Results  Component Value Date   INR 1.11 03/24/2017   INR 1.0 07/20/2013   Radiology X-ray Chest Pa Or Ap  Result Date: 07/15/2018 CLINICAL DATA:  Status post thoracentesis. EXAM: CHEST  1 VIEW COMPARISON:  Radiographs of December 30, 2017. FINDINGS: The heart size and mediastinal contours are within normal limits. Left lung is clear. Large loculated pleural effusion is noted superiorly and laterally in right hemithorax. No pneumothorax is noted. The visualized skeletal structures are unremarkable. IMPRESSION: Large loculated pleural effusion seen superiorly and laterally in right hemithorax. Electronically Signed   By: Marijo Conception, M.D.   On: 07/15/2018 13:33   Ct Chest Wo Contrast  Result Date: 07/07/2018 CLINICAL DATA:  Shortness of Breath and abnormal chest x-ray EXAM: CT CHEST WITHOUT CONTRAST TECHNIQUE: Multidetector CT imaging of the chest was performed following the standard protocol without IV contrast. COMPARISON:  Chest x-ray from the previous day. FINDINGS: Cardiovascular: Somewhat limited due to the lack of IV contrast. The thoracic aorta demonstrates a normal branching pattern without aneurysmal dilatation or significant atherosclerotic calcifications. No cardiac enlargement is seen. Mild coronary calcifications are noted. Mediastinum/Nodes: The thoracic inlet is within normal limits. No significant hilar or mediastinal adenopathy is identified at this time. The esophagus is within normal limits. Lungs/Pleura: The left lung is well aerated without focal infiltrate or sizable effusion. On the right there is a large loculated pleural effusion identified with increased attenuation identified within. These changes are most consistent with an empyema. Some upper lobe consolidation is noted adjacent to the fluid collection. These changes are similar to that seen on recent chest x-ray but new from a prior exam of 12/30/2017 Upper Abdomen: Visualized upper  abdomen is within normal limits. Musculoskeletal: Degenerative changes of the thoracic spine are noted. No definitive rib fracture is seen. IMPRESSION: Changes consistent with large loculated pleural effusion likely representing an empyema on the right. This would likely benefit from large bore chest tube  placement with possible lytic therapy. Associated right upper lobe consolidation is noted. These results will be called to the ordering clinician or representative by the Radiologist Assistant, and communication documented in the PACS or zVision Dashboard. Electronically Signed   By: Inez Catalina M.D.   On: 06/24/2018 10:23   US Thoracentesis Asp Pleural Space W/img Guide  Result Date: 07/15/2018 Laverle Hobby, MD     07/18/2018  8:27 AM PROCEDURE NOTE: THORACENTESIS Date: 07/15/2018, MRN# [id number] [place] Indication: Right pleural effusion A time-out was completed verifying correct patient, procedure, site, positioning, and implant(s) or special equipment if applicable. Ultrasound guidance was used and appropriate fluid pocket was identified and marked.  Patient was positioned, prepped and draped in usual sterile fashion. 1% Lidocaine was used to anesthetize the area. Under ultrasound guidance a right upper lobe loculated pleural effusion was noted, this effusion appeared to be high density, considerably higher than a typical fluid density. The site was anesthetized with 1% topical lidocaine.  The thoracentesis kit was used, the trocar was introduced over the tube.  It was advanced until fluid was aspirated into the syringe, the fluid appeared to be grossly bloody.  Approximately 15 cc could be drained, further fluid could not be drained.  Therefore the procedure was stopped, obtained fluid was sent to the lab for evaluation. A chest xray was ordered to evaluate for pneumothorax. Total Fluid Removed: 15 cc of grossly bloody fluid. DDx: Loculated empyema vs. Hemothorax. --Will check CT chest.  Patient tolerated the procedure well and there were no complications noted. Marda Stalker, M.D., F.C.C.P. Board Certified in Internal Medicine, Pulmonary Medicine, [place], and Sleep Medicine. [facility] Office Number: [phone]   Assessment/Plan The patient is a 67 year old male with a past medical history of DVT, PE, morbid obesity, chronic cough, MI, sleep apnea and pulmonary hypertension being admitted to our hospitalist service with a hemothorax - stable 1. DVT / PE: The patient has a past medical history of DVT and subsequent PE in 2014 and then 2017.  He was placed on Xarelto.  The patient presents now with hemothorax.  This is clearly a contraindication to continued oral anticoagulation.  In an attempt to protect the patient from another DVT/PE would recommend IVC filter placement.  Procedure, risks and benefits explained to the patient.  All questions answered.  The patient wishes to proceed.  Will plan on completing this this Friday, July 17, 2018 with Dr. Delana Meyer 2. Hemothorax: No recent surgery or trauma. Oral anticoagulation is contraindicated. Followed by Thoracic surgery for drainage.  3. Hypertension: Encouraged good control as its slows the progression of atherosclerotic disease  Discussed with Dr. Mayme Genta, PA-C  06/25/2018 3:08 PM    This note was created with Dragon medical transcription system.  Any error is purely unintentional

## 2018-07-17 ENCOUNTER — Encounter: Admission: EM | Disposition: E | Payer: Self-pay | Source: Home / Self Care | Attending: Family Medicine

## 2018-07-17 ENCOUNTER — Ambulatory Visit: Payer: Medicare PPO

## 2018-07-17 ENCOUNTER — Inpatient Hospital Stay: Payer: Medicare PPO

## 2018-07-17 ENCOUNTER — Encounter: Payer: Self-pay | Admitting: Vascular Surgery

## 2018-07-17 DIAGNOSIS — Z7901 Long term (current) use of anticoagulants: Secondary | ICD-10-CM

## 2018-07-17 DIAGNOSIS — I824Z9 Acute embolism and thrombosis of unspecified deep veins of unspecified distal lower extremity: Secondary | ICD-10-CM

## 2018-07-17 DIAGNOSIS — J942 Hemothorax: Secondary | ICD-10-CM

## 2018-07-17 DIAGNOSIS — I10 Essential (primary) hypertension: Secondary | ICD-10-CM

## 2018-07-17 DIAGNOSIS — I82409 Acute embolism and thrombosis of unspecified deep veins of unspecified lower extremity: Secondary | ICD-10-CM

## 2018-07-17 HISTORY — PX: IVC FILTER INSERTION: CATH118245

## 2018-07-17 LAB — PROTIME-INR
INR: 1.27
PROTHROMBIN TIME: 15.8 s — AB (ref 11.4–15.2)

## 2018-07-17 LAB — APTT: aPTT: 49 seconds — ABNORMAL HIGH (ref 24–36)

## 2018-07-17 LAB — HIV ANTIBODY (ROUTINE TESTING W REFLEX): HIV Screen 4th Generation wRfx: NONREACTIVE

## 2018-07-17 SURGERY — IVC FILTER INSERTION
Anesthesia: Moderate Sedation

## 2018-07-17 MED ORDER — LIDOCAINE HCL (PF) 1 % IJ SOLN
INTRAMUSCULAR | Status: AC
  Filled 2018-07-17: qty 30

## 2018-07-17 MED ORDER — FENTANYL CITRATE (PF) 100 MCG/2ML IJ SOLN
INTRAMUSCULAR | Status: AC
  Filled 2018-07-17: qty 4

## 2018-07-17 MED ORDER — SODIUM CHLORIDE 0.9% FLUSH
10.0000 mL | Freq: Two times a day (BID) | INTRAVENOUS | Status: DC
  Administered 2018-07-17: 20 mL
  Administered 2018-07-18 – 2018-07-19 (×2): 10 mL
  Administered 2018-07-19: 20 mL
  Administered 2018-07-20: 10 mL
  Administered 2018-07-20: 20 mL
  Administered 2018-07-21 (×2): 10 mL

## 2018-07-17 MED ORDER — LIDOCAINE HCL (PF) 1 % IJ SOLN
INTRAMUSCULAR | Status: AC | PRN
  Administered 2018-07-17: 10 mL

## 2018-07-17 MED ORDER — MIDAZOLAM HCL 5 MG/5ML IJ SOLN
INTRAMUSCULAR | Status: AC | PRN
  Administered 2018-07-17 (×2): 1 mg via INTRAVENOUS
  Administered 2018-07-17 (×2): 0.5 mg via INTRAVENOUS
  Administered 2018-07-17: 1 mg via INTRAVENOUS

## 2018-07-17 MED ORDER — FENTANYL CITRATE (PF) 100 MCG/2ML IJ SOLN
INTRAMUSCULAR | Status: AC | PRN
  Administered 2018-07-17 (×4): 25 ug via INTRAVENOUS
  Administered 2018-07-17: 50 ug via INTRAVENOUS

## 2018-07-17 MED ORDER — SODIUM CHLORIDE 0.9 % IV SOLN: INTRAVENOUS | Status: DC

## 2018-07-17 MED ORDER — FENTANYL CITRATE (PF) 100 MCG/2ML IJ SOLN
INTRAMUSCULAR | Status: DC | PRN
  Administered 2018-07-17 (×3): 25 ug via INTRAVENOUS

## 2018-07-17 MED ORDER — SODIUM CHLORIDE 0.9% FLUSH
10.0000 mL | INTRAVENOUS | Status: DC | PRN
  Administered 2018-07-21: 10 mL
  Filled 2018-07-17: qty 40

## 2018-07-17 MED ORDER — MIDAZOLAM HCL 2 MG/2ML IJ SOLN
INTRAMUSCULAR | Status: AC
  Filled 2018-07-17: qty 2

## 2018-07-17 MED ORDER — FENTANYL CITRATE (PF) 100 MCG/2ML IJ SOLN
INTRAMUSCULAR | Status: AC
  Filled 2018-07-17: qty 2

## 2018-07-17 MED ORDER — MIDAZOLAM HCL 2 MG/2ML IJ SOLN
INTRAMUSCULAR | Status: DC | PRN
Start: 2018-07-17 — End: 2018-07-17
  Administered 2018-07-17 (×3): 0.5 mg via INTRAVENOUS

## 2018-07-17 MED ORDER — OXYCODONE-ACETAMINOPHEN 5-325 MG PO TABS
1.0000 | ORAL_TABLET | ORAL | Status: DC | PRN
  Administered 2018-07-17 (×2): 1 via ORAL
  Administered 2018-07-18 – 2018-07-21 (×7): 2 via ORAL
  Filled 2018-07-17: qty 1
  Filled 2018-07-17 (×2): qty 2
  Filled 2018-07-17: qty 1
  Filled 2018-07-17 (×5): qty 2

## 2018-07-17 MED ORDER — INSULIN ASPART 100 UNIT/ML ~~LOC~~ SOLN: 0.0000 [IU] | Freq: Three times a day (TID) | SUBCUTANEOUS | Status: DC

## 2018-07-17 MED ORDER — IOPAMIDOL (ISOVUE-300) INJECTION 61%
INTRAVENOUS | Status: DC | PRN
  Administered 2018-07-17: 15 mL via INTRAVENOUS

## 2018-07-17 MED ORDER — CEFAZOLIN SODIUM-DEXTROSE 2-4 GM/100ML-% IV SOLN
2.0000 g | INTRAVENOUS | Status: AC
  Administered 2018-07-17: 2 g via INTRAVENOUS
  Filled 2018-07-17: qty 100

## 2018-07-17 MED ORDER — HEPARIN (PORCINE) IN NACL 1000-0.9 UT/500ML-% IV SOLN
INTRAVENOUS | Status: AC
  Filled 2018-07-17: qty 500

## 2018-07-17 MED ORDER — MIDAZOLAM HCL 5 MG/5ML IJ SOLN
INTRAMUSCULAR | Status: AC
  Filled 2018-07-17: qty 5

## 2018-07-17 SURGICAL SUPPLY — 7 items
FILTER CELECT-JUGULAR (Filter) ×2 IMPLANT
KIT CATH CVC 3 LUMEN 7FR 8IN (MISCELLANEOUS) ×2 IMPLANT
NDL ENTRY 21GA 7CM ECHOTIP (NEEDLE) IMPLANT
NEEDLE ENTRY 21GA 7CM ECHOTIP (NEEDLE) ×3 IMPLANT
PACK ANGIOGRAPHY (CUSTOM PROCEDURE TRAY) ×2 IMPLANT
SET INTRO CAPELLA COAXIAL (SET/KITS/TRAYS/PACK) ×2 IMPLANT
WIRE J 3MM .035X145CM (WIRE) ×2 IMPLANT

## 2018-07-17 NOTE — Progress Notes (Signed)
[place] at [facility]                                                                                                                                                                                  Patient Demographics   Jeff Leonard, is a 67 y.o. male, DOB - 07-23-1951, OIZ:[id number]  Admit date - 07/11/2018   Admitting Physician Nestor Lewandowsky, MD  Outpatient Primary MD for the patient is Casilda Carls, MD   LOS - 1  Subjective: Patient admitted with right-sided locular pleural effusion had a IR placed a tube in, he underwent IVC filter placed by vascular surgery Complains of pain on the right side   Review of Systems:   CONSTITUTIONAL: No documented fever. No fatigue, weakness. No weight gain, no weight loss.  EYES: No blurry or double vision.  ENT: No tinnitus. No postnasal drip. No redness of the oropharynx.  RESPIRATORY: No cough, no wheeze, no hemoptysis. No dyspnea.  CARDIOVASCULAR: Right-sided chest pain at the site of chest tube. No orthopnea. No palpitations. No syncope.  GASTROINTESTINAL: No nausea, no vomiting or diarrhea. No abdominal pain. No melena or hematochezia.  GENITOURINARY: No dysuria or hematuria.  ENDOCRINE: No polyuria or nocturia. No heat or cold intolerance.  HEMATOLOGY: No anemia. No bruising. No bleeding.  INTEGUMENTARY: No rashes. No lesions.  MUSCULOSKELETAL: No arthritis. No swelling. No gout.  NEUROLOGIC: No numbness, tingling, or ataxia. No seizure-type activity.  PSYCHIATRIC: No anxiety. No insomnia. No ADD.    Vitals:   Vitals:   07/12/2018 1300 06/21/2018 1350 07/01/2018 1400 07/07/2018 1457  BP:  104/72 108/69 112/74  Pulse:  62 66 66  Resp:  (!) _0 Temp:    98 F (36.7 C)  TempSrc:    Oral  SpO2: 98% 92% 94% 98%  Weight:      Height:        Wt Readings from Last 3 Encounters:  06/22/2018 129.2 kg  07/10/18 129.3 kg  06/03/18 133.5 kg     Intake/Output Summary (Last 24 hours) at 07/14/2018  1656 Last data filed at 07/19/2018 1439 Gross per 24 hour  Intake 871.54 ml  Output 1450 ml  Net -578.46 ml    Physical Exam:   GENERAL: Pleasant-appearing in no apparent distress.  HEAD, EYES, EARS, NOSE AND THROAT: Atraumatic, normocephalic. Extraocular muscles are intact. Pupils equal and reactive to light. Sclerae anicteric. No conjunctival injection. No oro-pharyngeal erythema.  NECK: Supple. There is no jugular venous distention. No bruits, no lymphadenopathy, no thyromegaly.  HEART: Regular rate and rhythm,. No murmurs, no rubs, no clicks.  LUNGS: Clear to auscultation bilaterally. No rales or rhonchi. No  wheezes.  ABDOMEN: Soft, flat, nontender, nondistended. Has good bowel sounds. No hepatosplenomegaly appreciated.  EXTREMITIES: No evidence of any cyanosis, clubbing, or peripheral edema.  +2 pedal and radial pulses bilaterally.  NEUROLOGIC: The patient is alert, awake, and oriented x3 with no focal motor or sensory deficits appreciated bilaterally.  SKIN: Moist and warm with no rashes appreciated.  Psych: Not anxious, depressed LN: No inguinal LN enlargement    Antibiotics   Anti-infectives (From admission, onward)   Start     Dose/Rate Route Frequency Ordered Stop   07/07/2018 1230  ceFAZolin (ANCEF) IVPB 2g/100 mL premix    Note to Pharmacy:  Send with patient to Specials   2 g 200 mL/hr over 30 Minutes Intravenous On call 07/18/2018 1229 07/01/2018 1555      Medications   Scheduled Meds: . clomiPHENE  25 mg Oral Daily  . lisinopril  5 mg Oral Daily   Continuous Infusions: . dextrose 5 % and 0.45% NaCl 75 mL/hr at 07/19/2018 1636   PRN Meds:.oxyCODONE-acetaminophen, pneumococcal 23 valent vaccine   Data Review:   Micro Results Recent Results (from the past 240 hour(s))  Body fluid culture     Status: None (Preliminary result)   Collection Time: 07/15/18 12:54 PM  Result Value Ref Range Status   Specimen Description   Final    PLEURAL Performed at [facility], [address] [place], [place] [address]    Special Requests   Final    NONE Performed at [facility], [address], [place], [place] [address]    Gram Stain NO WBC SEEN NO ORGANISMS SEEN   Final   Culture   Final    NO GROWTH 2 DAYS Performed at [facility], [place] [address] [place], [place] [address]    Report Status PENDING  Incomplete  Aerobic/Anaerobic Culture (surgical/deep wound)     Status: None (Preliminary result)   Collection Time: 06/22/2018 11:30 AM  Result Value Ref Range Status   Specimen Description   Final    WOUND PLEURAL RIGHT Performed at [facility], [place] [address] [place], [place] [address]    Special Requests   Final    Normal Performed at [facility], [address] [place], [place] [address]    Gram Stain PENDING  Incomplete   Culture PENDING  Incomplete   Report Status PENDING  Incomplete    Radiology Reports X-ray Chest Pa Or Ap  Result Date: 07/15/2018 CLINICAL DATA:  Status post thoracentesis. EXAM: CHEST  1 VIEW COMPARISON:  Radiographs of December 30, 2017. FINDINGS: The heart size and mediastinal contours are within normal limits. Left lung is clear. Large loculated pleural effusion is noted superiorly and laterally in right hemithorax. No pneumothorax is noted. The visualized skeletal structures are unremarkable. IMPRESSION: Large loculated pleural effusion seen superiorly and laterally in right hemithorax. Electronically Signed   By: Marijo Conception, M.D.   On: 07/15/2018 13:33   Ct Chest Wo Contrast  Result Date: 06/25/2018 CLINICAL DATA:  Shortness of Breath and abnormal chest x-ray EXAM: CT CHEST WITHOUT CONTRAST TECHNIQUE: Multidetector CT imaging of the chest was performed following the standard protocol without IV contrast. COMPARISON:  Chest x-ray from the previous day. FINDINGS: Cardiovascular: Somewhat limited due to the lack of IV contrast. The thoracic aorta demonstrates a normal  branching pattern without aneurysmal dilatation or significant atherosclerotic calcifications. No cardiac enlargement is seen. Mild coronary calcifications are noted. Mediastinum/Nodes: The thoracic inlet is  within normal limits. No significant hilar or mediastinal adenopathy is identified at this time. The esophagus is within normal limits. Lungs/Pleura: The left lung is well aerated without focal infiltrate or sizable effusion. On the right there is a large loculated pleural effusion identified with increased attenuation identified within. These changes are most consistent with an empyema. Some upper lobe consolidation is noted adjacent to the fluid collection. These changes are similar to that seen on recent chest x-ray but new from a prior exam of 12/30/2017 Upper Abdomen: Visualized upper abdomen is within normal limits. Musculoskeletal: Degenerative changes of the thoracic spine are noted. No definitive rib fracture is seen. IMPRESSION: Changes consistent with large loculated pleural effusion likely representing an empyema on the right. This would likely benefit from large bore chest tube placement with possible lytic therapy. Associated right upper lobe consolidation is noted. These results will be called to the ordering clinician or representative by the Radiologist Assistant, and communication documented in the PACS or zVision Dashboard. Electronically Signed   By: Inez Catalina M.D.   On: 07/11/2018 10:23   Ct Image Guided Drainage By Percutaneous Catheter  Result Date: 07/05/2018 INDICATION: 67 year old with a loculated right hemothorax. Plan for chest tube placement and intrapleural thrombolytics therapy. EXAM: CT-GUIDED PLACEMENT OF RIGHT CHEST TUBE MEDICATIONS: Sedation medications ANESTHESIA/SEDATION: 4.0 mg IV Versed 150 mcg IV Fentanyl Moderate Sedation Time:  27 minutes The patient was continuously monitored during the procedure by the interventional radiology nurse under my direct supervision.  COMPLICATIONS: None immediate. TECHNIQUE: Informed written consent was obtained from the patient after a thorough discussion of the procedural risks, benefits and alternatives. All questions were addressed. Maximal Sterile Barrier Technique was utilized including caps, mask, sterile gowns, sterile gloves, sterile drape, hand hygiene and skin antiseptic. A timeout was performed prior to the initiation of the procedure. PROCEDURE: Patient was placed supine on the CT scanner with the right arm elevated. Images through the chest were obtained. The right mid axillary region was prepped and draped in a sterile fashion using chlorhexidine. Skin and soft tissues were anesthetized with 1% lidocaine. Initially, a 10 cm Yueh catheter was directed into the pleural space but this was too short. 15 cm ,18 gauge trocar needle was directed in the pleural space with CT guidance and a small amount of brown fluid was aspirated. Stiff Amplatz wire was advanced into the pleural space. The tract was dilated to accommodate a 14 [place] multipurpose drain. Additional dark fluid was aspirated. The catheter was sutured to skin and attached to a PleurEvac device. FINDINGS: Complex right pleural effusion with thickened pleural wall. Dark brown fluid removed from the pleural space and compatible with old blood. Catheter is well positioned within the pleural space. Small pocket of gas adjacent to the chest tube is compatible with a complex or loculated collection. IMPRESSION: CT-guided placement of a 14 French right chest tube. Electronically Signed   By: Markus Daft M.D.   On: 07/07/2018 12:53   US Thoracentesis Asp Pleural Space W/img Guide  Result Date: 07/15/2018 Laverle Hobby, MD     07/08/2018  8:27 AM PROCEDURE NOTE: THORACENTESIS Date: 07/15/2018, MRN# [id number] [place] Indication: Right pleural effusion A time-out was completed verifying correct patient, procedure, site, positioning, and implant(s) or special equipment if  applicable. Ultrasound guidance was used and appropriate fluid pocket was identified and marked.  Patient was positioned, prepped and draped in usual sterile fashion. 1% Lidocaine was used to anesthetize the area. Under ultrasound guidance a right upper  lobe loculated pleural effusion was noted, this effusion appeared to be high density, considerably higher than a typical fluid density. The site was anesthetized with 1% topical lidocaine.  The thoracentesis kit was used, the trocar was introduced over the tube.  It was advanced until fluid was aspirated into the syringe, the fluid appeared to be grossly bloody.  Approximately 15 cc could be drained, further fluid could not be drained.  Therefore the procedure was stopped, obtained fluid was sent to the lab for evaluation. A chest xray was ordered to evaluate for pneumothorax. Total Fluid Removed: 15 cc of grossly bloody fluid. DDx: Loculated empyema vs. Hemothorax. --Will check CT chest. Patient tolerated the procedure well and there were no complications noted. Marda Stalker, M.D., F.C.C.P. Board Certified in Internal Medicine, Pulmonary Medicine, [place], and Sleep Medicine. [facility] Office Number: [phone]     CBC Recent Labs  Lab 06/30/2018 1111  WBC 8.2  HGB 12.4*  HCT 35.4*  PLT 264  MCV 91.9  MCH 32.2  MCHC 35.0  RDW 13.6  LYMPHSABS 1.3  MONOABS 0.5  EOSABS 0.4  BASOSABS 0.1    Chemistries  Recent Labs  Lab 07/14/2018 1111  NA 138  K 4.0  CL 105  CO2 25  GLUCOSE 118*  BUN 13  CREATININE 1.16  CALCIUM 8.9  AST 16  ALT 9  ALKPHOS 33*  BILITOT 0.7   ------------------------------------------------------------------------------------------------------------------ estimated creatinine clearance is 88.2 mL/min (by C-G formula based on SCr of 1.16 mg/dL). ------------------------------------------------------------------------------------------------------------------ No results  for input(s): HGBA1C in the last 72 hours. ------------------------------------------------------------------------------------------------------------------ No results for input(s): CHOL, HDL, LDLCALC, TRIG, CHOLHDL, LDLDIRECT in the last 72 hours. ------------------------------------------------------------------------------------------------------------------ No results for input(s): TSH, T4TOTAL, T3FREE, THYROIDAB in the last 72 hours.  Invalid input(s): FREET3 ------------------------------------------------------------------------------------------------------------------ No results for input(s): VITAMINB12, FOLATE, FERRITIN, TIBC, IRON, RETICCTPCT in the last 72 hours.  Coagulation profile Recent Labs  Lab 06/24/2018 1726 07/04/2018 0523  INR 1.54 1.27    No results for input(s): DDIMER in the last 72 hours.  Cardiac Enzymes No results for input(s): CKMB, TROPONINI, MYOGLOBIN in the last 168 hours.  Invalid input(s): CK ------------------------------------------------------------------------------------------------------------------ Invalid input(s): [place]   Patient 67 year old admitted with loculated pleural effusion on CT surgery service   *Right loculated pleural effusion, likely hemothorax.    Are also on hold therapy per CT surgery  *History of recurrent pulmonary embolism and DVT.  Vascular surgery consulted.    Status post IVC filter place   *Hypertension continue therapy with lisinopril  *Glaucoma continue eyedrops   *DVT prophylaxis with SCDs.      Code Status Orders  (From admission, onward)         Start     Ordered   07/01/2018 1700  Full code  Continuous     07/18/2018 1659        Code Status History    Date Active Date Inactive Code Status Order ID Comments User Context   04/01/2017 0419 04/02/2017 1900 Full Code [id number]  Saundra Shelling, MD Inpatient   03/24/2017 1314 03/27/2017 2029 Full Code [id number]  Vaughan Basta, MD Inpatient             DVT Prophylaxis SCDs Lab Results  Component Value Date   PLT 264 06/24/2018     Time Spent in minutes   35 minutes greater than 50% of time spent in care coordination and counseling patient regarding the condition and plan of  care.   Dustin Flock M.D on 07/09/2018 at 4:56 PM  Between 7am to 6pm - Pager - [phone]  After 6pm go to [email or web address] - Proofreader  [employer]   Office  [phone]

## 2018-07-17 NOTE — Progress Notes (Signed)
  Patient ID: Jeff Leonard, male   DOB: 1951-04-13, 67 y.o.   MRN: [id number]  HISTORY: I did examine the patient and see him today in the interventional radiology suite.  He had just undergone a percutaneous catheter placement.  He was sedated and therefore a detailed interim history was unobtainable.  The catheter was placed without difficulty.  Is currently draining dark brown fluid which appears like old blood.  He is scheduled to have a filter placed in his inferior vena cava later today.   Vitals:   06/22/2018 1135 07/11/2018 1143  BP: 119/67 110/80  Pulse: 73 70  Resp: 18 (!) 24  Temp:    SpO2: 93% 96%     EXAM:    Resp: Lungs are diminished bilaterally.  No respiratory distress, normal effort. Heart:  Regular without murmurs Abd:  Abdomen is soft, non distended and non tender. No masses are palpable.  There is no rebound and no guarding.  Neurological: He is sedated. Skin: Skin is warm and dry. No rash noted. No diaphoretic. No erythema. No pallor.     ASSESSMENT: I discussed his care with Dr. Larena Leonard who placed the tube.  It does appear to be draining some old blood.  We will send an appropriate aliquots for analysis.  We will keep the chest tube to suction and consider placing TPA tomorrow.   PLAN:   He will obtain his IVC filter today.  We will repeat the chest x-ray in the morning.  We may consider TPA at that time.    [name], MDPatient ID: Jeff Leonard, male   DOB: [birth year], 67 y.o.   MRN: [id number]

## 2018-07-17 NOTE — Op Note (Signed)
[facility]   OPERATIVE NOTE    PRE-OPERATIVE DIAGNOSIS: DVT with PE  POST-OPERATIVE DIAGNOSIS: Same  PROCEDURE: 1.   Ultrasound guidance for vascular access to the right internal jugular vein 2.   Catheter placement into the inferior vena cava 3.   Inferior venacavogram 4.   Placement of a select IVC filter right jugular approach 5.   Insertion triple-lumen catheter same venous access  SURGEON: Levora Dredge  ASSISTANT(S): None  ANESTHESIA: Conscious sedation was administered by the interventional radiology RN under my direct supervision. IV Versed plus fentanyl were utilized. Continuous ECG, pulse oximetry and blood pressure was monitored throughout the entire procedure. Conscious sedation was for a total of 20 minutes.  ESTIMATED BLOOD LOSS: minimal  FINDING(S): 1.  Patent IVC  SPECIMEN(S):  none  INDICATIONS:   Jeff Leonard is a 67 y.o. y.o. male who presents with extensive DVT but hemothorax.  This situation require cessation of anticoagulation and is therefore undergoing IVC filter placement prevent lethal pulmonary embolism particularly in this patient with compromised pulmonary function.  Inferior vena cava filter is indicated for this reason.  Risks and benefits including filter thrombosis, migration, fracture, bleeding, and infection were all discussed.  We discussed that all IVC filters that we place can be removed if desired from the patient once the need for the filter has passed.    DESCRIPTION: After obtaining full informed written consent, the patient was brought back to the vascular suite. The skin of the neck and chest wall was sterilely prepped and draped in a sterile surgical field was created. Ultrasound was placed in a sterile sleeve. The right internal jugular vein was echolucent and compressible indicating patency. Image was recorded for the permanent record. The puncture was made under continuous real-time ultrasound guidance.  The  right internal jugular vein was accessed under direct ultrasound guidance without difficulty with a micropuncture needle. Microwire was then advanced under fluoroscopic guidance without difficulty. Micro-sheath was then inserted and a J-wire was then placed. The dilator is passed over the wire and the delivery sheath was placed into the inferior vena cava.  Inferior venacavogram was performed. This demonstrated a patent IVC with the level of the renal veins at L1-L2.  The filter was then deployed into the inferior vena cava at the level of L2 just below the renal veins.   The J-wire was then reinserted under fluoroscopic guidance ensuring that the wire did not cross through the filter.  The delivery sheath was then removed in its entirety over the wire.  Triple-lumen catheter have been prepped on the back table and was then advanced over the wire without difficulty.  Wire is then removed.  All 3 lm aspirated and flushed easily.  Triple-lumen is secured to the skin of the neck with 2-0 silk suture.  A pursestring suture of Monocryl was prepped placed around the insertion site to prevent bleeding.  Biopatch and sterile dressing is applied.  Sterile dressings were placed. The patient tolerated the procedure well and was taken to the recovery room in stable condition.  Interpretation: IVC is opacified with a bolus injection contrast.  Widely patent and measures 22 mm in diameter.  Reflux of contrast into the renal veins is noted at the L1-L2 level.  Select filter is deployed without difficulty in the upright position.  Triple-lumen is placed with its tip at the atrial caval junction.  COMPLICATIONS: None  CONDITION: Stable  Levora Dredge  August 03, 2018, 1:35 PM

## 2018-07-17 NOTE — H&P (Signed)
[facility] History & Physical Update  The patient was interviewed and re-examined.  The patient's previous History and Physical has been reviewed and is unchanged.  There is no change in the plan of care. We plan to proceed with the scheduled procedure.  Levora Dredge, MD  07/28/2018, 1:31 PM

## 2018-07-17 NOTE — Consult Note (Signed)
Chief Complaint: Patient was seen in consultation today for  Chief Complaint  Patient presents with  . Pleural Effusion    Referring Physician(s): Nestor Lewandowsky, MD   Patient Status: [place] - In-pt  History of Present Illness: Jeff Leonard is a 67 y.o. male with worsening shortness of breath.  Recent imaging demonstrates a loculated right pleural effusion.  Recent thoracentesis yielded a small amount of blood.  Findings are suggestive for a right hemothorax.  Past medical history is significant for pulmonary embolism and the patient has been taking Xarelto.  Patient's main complaint is shortness of breath.  Slightly decreased appetite.  He denies chest pain.  No GI or GU symptoms.  Past Medical History:  Diagnosis Date  . Glaucoma   . Hormone disorder   . Hx of pulmonary embolus   . Myocardial infarction ([place])   . Past heart attack   . Sleep apnea     Past Surgical History:  Procedure Laterality Date  . SHOULDER SURGERY     left shoulder     Allergies: Patient has no known allergies.  Medications: Prior to Admission medications   Medication Sig Start Date End Date Taking? Authorizing Provider  albuterol (PROVENTIL HFA;VENTOLIN HFA) 108 (90 Base) MCG/ACT inhaler Inhale 2 puffs into the lungs every 6 (six) hours as needed for wheezing or shortness of breath. 04/02/17  Yes Wieting, Richard, MD  cetirizine (ZYRTEC) 10 MG tablet Take 10 mg by mouth daily. 07/08/18  Yes [provider]  clomiPHENE (CLOMID) 50 MG tablet One half tab daily Patient taking differently: Take 25 mg by mouth daily.  06/14/18  Yes Stoioff, Ronda Fairly, MD  cyclobenzaprine (FLEXERIL) 5 MG tablet Take 5 mg by mouth at bedtime as needed. 04/15/18  Yes [provider]  dorzolamide (TRUSOPT) 2 % ophthalmic solution Place 1 drop into both eyes daily.   Yes [provider]  latanoprost (XALATAN) 0.005 % ophthalmic solution Place 1 drop into both eyes at bedtime.  02/13/17  Yes [provider]  lisinopril (PRINIVIL,ZESTRIL) 5 MG tablet Take 5 mg by mouth daily.  05/11/18  Yes [provider]  timolol (TIMOPTIC) 0.5 % ophthalmic solution Place 1 drop into both eyes daily.  02/14/17  Yes [provider]  Vitamin D, Ergocalciferol, (DRISDOL) 50000 units CAPS capsule Take 50,000 Units by mouth every 7 (seven) days.  03/23/18  Yes [provider]  XARELTO 20 MG TABS tablet Take 20 mg by mouth every evening.  06/01/18  Yes [provider]     Family History  Problem Relation Age of Onset  . Heart disease Father 71  . Hypertension Mother   . Cancer Mother        Breast cancer   . Asthma Maternal Grandfather   . Cancer Paternal Uncle        lung    Social History   Socioeconomic History  . Marital status: Married    Spouse name: Not on file  . Number of children: Not on file  . Years of education: Not on file  . Highest education level: Not on file  Occupational History  . Occupation: retired  Scientific laboratory technician  . Financial resource strain: Not on file  . Food insecurity:    Worry: Not on file    Inability: Not on file  . Transportation needs:    Medical: Not on file    Non-medical: Not on file  Tobacco Use  . Smoking status: Never Smoker  .  Smokeless tobacco: Never Used  Substance and Sexual Activity  . Alcohol use: No  . Drug use: No  . Sexual activity: Yes  Lifestyle  . Physical activity:    Days per week: Not on file    Minutes per session: Not on file  . Stress: Not on file  Relationships  . Social connections:    Talks on phone: Not on file    Gets together: Not on file    Attends religious service: Not on file    Active member of club or organization: Not on file    Attends meetings of clubs or organizations: Not on file    Relationship status: Not on file  Other Topics Concern  . Not on file  Social History Narrative  . Not on file      Review of Systems: A 12 point ROS discussed and pertinent  positives are indicated in the HPI above.  All other systems are negative.  Review of Systems  Respiratory: Positive for shortness of breath.   Cardiovascular: Negative for chest pain.    Vital Signs: BP 110/73 (BP Location: Left Arm)   Pulse 77   Temp 98.3 F (36.8 C) (Oral)   Resp 18   Ht '6\' 1"'$  (1.854 m)   Wt 129.2 kg   SpO2 95%   BMI 37.58 kg/m   Physical Exam  Constitutional: No distress.  HENT:  Mouth/Throat: Oropharynx is clear and moist.  Cardiovascular: Normal rate, regular rhythm and normal heart sounds.  Pulmonary/Chest:  Decreased breath sounds on right  Abdominal: Soft. He exhibits no distension.  Vitals reviewed.   Imaging: X-ray Chest Pa Or Ap  Result Date: 07/15/2018 CLINICAL DATA:  Status post thoracentesis. EXAM: CHEST  1 VIEW COMPARISON:  Radiographs of December 30, 2017. FINDINGS: The heart size and mediastinal contours are within normal limits. Left lung is clear. Large loculated pleural effusion is noted superiorly and laterally in right hemithorax. No pneumothorax is noted. The visualized skeletal structures are unremarkable. IMPRESSION: Large loculated pleural effusion seen superiorly and laterally in right hemithorax. Electronically Signed   By: Marijo Conception, M.D.   On: 07/15/2018 13:33   Ct Chest Wo Contrast  Result Date: 07/19/2018 CLINICAL DATA:  Shortness of Breath and abnormal chest x-ray EXAM: CT CHEST WITHOUT CONTRAST TECHNIQUE: Multidetector CT imaging of the chest was performed following the standard protocol without IV contrast. COMPARISON:  Chest x-ray from the previous day. FINDINGS: Cardiovascular: Somewhat limited due to the lack of IV contrast. The thoracic aorta demonstrates a normal branching pattern without aneurysmal dilatation or significant atherosclerotic calcifications. No cardiac enlargement is seen. Mild coronary calcifications are noted. Mediastinum/Nodes: The thoracic inlet is within normal limits. No significant hilar or  mediastinal adenopathy is identified at this time. The esophagus is within normal limits. Lungs/Pleura: The left lung is well aerated without focal infiltrate or sizable effusion. On the right there is a large loculated pleural effusion identified with increased attenuation identified within. These changes are most consistent with an empyema. Some upper lobe consolidation is noted adjacent to the fluid collection. These changes are similar to that seen on recent chest x-ray but new from a prior exam of 12/30/2017 Upper Abdomen: Visualized upper abdomen is within normal limits. Musculoskeletal: Degenerative changes of the thoracic spine are noted. No definitive rib fracture is seen. IMPRESSION: Changes consistent with large loculated pleural effusion likely representing an empyema on the right. This would likely benefit from large bore chest tube placement with possible lytic  therapy. Associated right upper lobe consolidation is noted. These results will be called to the ordering clinician or representative by the Radiologist Assistant, and communication documented in the PACS or zVision Dashboard. Electronically Signed   By: Inez Catalina M.D.   On: 07/13/2018 10:23   US Thoracentesis Asp Pleural Space W/img Guide  Result Date: 07/15/2018 Laverle Hobby, MD     07/14/2018  8:27 AM PROCEDURE NOTE: THORACENTESIS Date: 07/15/2018, MRN# [id number] [place] Indication: Right pleural effusion A time-out was completed verifying correct patient, procedure, site, positioning, and implant(s) or special equipment if applicable. Ultrasound guidance was used and appropriate fluid pocket was identified and marked.  Patient was positioned, prepped and draped in usual sterile fashion. 1% Lidocaine was used to anesthetize the area. Under ultrasound guidance a right upper lobe loculated pleural effusion was noted, this effusion appeared to be high density, considerably higher than a typical fluid density. The site was  anesthetized with 1% topical lidocaine.  The thoracentesis kit was used, the trocar was introduced over the tube.  It was advanced until fluid was aspirated into the syringe, the fluid appeared to be grossly bloody.  Approximately 15 cc could be drained, further fluid could not be drained.  Therefore the procedure was stopped, obtained fluid was sent to the lab for evaluation. A chest xray was ordered to evaluate for pneumothorax. Total Fluid Removed: 15 cc of grossly bloody fluid. DDx: Loculated empyema vs. Hemothorax. --Will check CT chest. Patient tolerated the procedure well and there were no complications noted. Marda Stalker, M.D., F.C.C.P. Board Certified in Internal Medicine, Pulmonary Medicine, [place], and Sleep Medicine. [facility] Office Number: [phone]    Labs:  CBC: Recent Labs    06/27/2018 1111  WBC 8.2  HGB 12.4*  HCT 35.4*  PLT 264    COAGS: Recent Labs    07/12/2018 1726 06/21/2018 0523  INR 1.54 1.27  APTT 53* 49*    BMP: Recent Labs    06/25/2018 1111  NA 138  K 4.0  CL 105  CO2 25  GLUCOSE 118*  BUN 13  CALCIUM 8.9  CREATININE 1.16  GFRNONAA >60  GFRAA >60    LIVER FUNCTION TESTS: Recent Labs    07/13/2018 1111  BILITOT 0.7  AST 16  ALT 9  ALKPHOS 33*  PROT 7.8  ALBUMIN 3.3*    TUMOR MARKERS: No results for input(s): AFPTM, CEA, CA199, CHROMGRNA in the last 8760 hours.  Assessment and Plan:  67 year old with a loculated right pleural effusion and most compatible with hemothorax.  Plan for placement of a CT-guided chest tube followed by intrapleural thrombolytic therapy by cardiothoracic surgery.  CT-guided chest tube placement was discussed with the patient and his wife.  Informed consent was obtained.  Plan to use moderate sedation.  Thank you for this interesting consult.  I greatly enjoyed meeting Javier Gell and look forward to participating in their care.  A copy of this report was sent to  the requesting provider on this date.  Electronically Signed: Burman Riis, MD 07/07/2018, 10:41 AM   I spent a total of 20 Minutes    in face to face in clinical consultation, greater than 50% of which was counseling/coordinating care for CT -uided chest tube placement.

## 2018-07-17 NOTE — Procedures (Signed)
  Pre-operative Diagnosis: Right hemothorax         Post-operative Diagnosis: Right hemothorax   Indications: Needs drainage and thrombolytic therapy  Procedure: CT guided right chest tube placement  Findings: Complex right pleural effusion.  Placed 14 Fr drain and removed brown fluid.  Complications: None     EBL: None  Plan: Chest tube to wall suction.  Intrapleural lytic therapy per CT surgery.

## 2018-07-18 ENCOUNTER — Inpatient Hospital Stay: Payer: Medicare PPO

## 2018-07-18 MED ORDER — DORZOLAMIDE HCL 2 % OP SOLN
1.0000 [drp] | Freq: Every day | OPHTHALMIC | Status: DC
  Administered 2018-07-18 – 2018-07-21 (×4): 1 [drp] via OPHTHALMIC
  Filled 2018-07-18: qty 10

## 2018-07-18 MED ORDER — SODIUM CHLORIDE 0.9 % IJ SOLN
Freq: Once | INTRAMUSCULAR | Status: DC
  Filled 2018-07-18: qty 10

## 2018-07-18 MED ORDER — LATANOPROST 0.005 % OP SOLN
1.0000 [drp] | Freq: Every day | OPHTHALMIC | Status: DC
  Administered 2018-07-18 – 2018-07-21 (×4): 1 [drp] via OPHTHALMIC
  Filled 2018-07-18: qty 2.5

## 2018-07-18 MED ORDER — TIMOLOL MALEATE 0.25 % OP SOLN
1.0000 [drp] | Freq: Every day | OPHTHALMIC | Status: DC
  Administered 2018-07-18 – 2018-07-21 (×4): 1 [drp] via OPHTHALMIC
  Filled 2018-07-18: qty 5

## 2018-07-18 NOTE — Progress Notes (Signed)
  Patient ID: Jeff Leonard, male   DOB: Jan 08, 1951, 67 y.o.   MRN: [id number]  HISTORY: Overall he feels much better.  Less short of breath.  No cough or fever.   Vitals:   07/18/18 0447 07/18/18 0915  BP: 105/65 121/77  Pulse: 65 78  Resp: 16   Temp: 98.2 F (36.8 C)   SpO2: 95%      EXAM:    Resp: Lungs are clear bilaterally except at the right base.  No respiratory distress, normal effort. Heart:  Regular without murmurs Abd:  Abdomen is soft, non distended and non tender. No masses are palpable.  There is no rebound and no guarding.  Neurological: Alert and oriented to person, place, and time. Coordination normal.  Skin: Skin is warm and dry. No rash noted. No diaphoretic. No erythema. No pallor.  Psychiatric: Normal mood and affect. Normal behavior. Judgment and thought content normal.    ASSESSMENT: Independent review of his chest xray from today shows continued large right pleural effusion.   PLAN:   I have administered 10 mg of TPA through his chest tube.  I instructed the nursing staff to leave the drain clamped for 5 hours and then place back to suction.  Will repeat CXRay in the morning.      [name], MDPatient ID: Jeff Leonard, male   DOB: 02/20/1951, 67 y.o.   MRN: [id number]

## 2018-07-18 NOTE — Progress Notes (Signed)
[facility] [place] at [facility]   PATIENT NAME: Jeff Leonard    MR#:  [id number]  DATE OF BIRTH:  [birth year]  SUBJECTIVE:  CHIEF COMPLAINT:   Chief Complaint  Patient presents with  . Pleural Effusion  Patient without complaint, no events overnight per nursing staff, cardiothoracic input greatly appreciated  REVIEW OF SYSTEMS:  CONSTITUTIONAL: No fever, fatigue or weakness.  EYES: No blurred or double vision.  EARS, NOSE, AND THROAT: No tinnitus or ear pain.  RESPIRATORY: No cough, shortness of breath, wheezing or hemoptysis.  CARDIOVASCULAR: No chest pain, orthopnea, edema.  GASTROINTESTINAL: No nausea, vomiting, diarrhea or abdominal pain.  GENITOURINARY: No dysuria, hematuria.  ENDOCRINE: No polyuria, nocturia,  HEMATOLOGY: No anemia, easy bruising or bleeding SKIN: No rash or lesion. MUSCULOSKELETAL: No joint pain or arthritis.   NEUROLOGIC: No tingling, numbness, weakness.  PSYCHIATRY: No anxiety or depression.   ROS  DRUG ALLERGIES:  No Known Allergies  VITALS:  Blood pressure 121/77, pulse 78, temperature 98.2 F (36.8 C), temperature source Oral, resp. rate 16, height 6\' 1"  (1.854 m), weight 129.2 kg, SpO2 95 %.  PHYSICAL EXAMINATION:  GENERAL:  67 y.o.-year-old patient lying in the bed with no acute distress.  EYES: Pupils equal, round, reactive to light and accommodation. No scleral icterus. Extraocular muscles intact.  HEENT: Head atraumatic, normocephalic. Oropharynx and nasopharynx clear.  NECK:  Supple, no jugular venous distention. No thyroid enlargement, no tenderness.  LUNGS: Normal breath sounds bilaterally, no wheezing, rales,rhonchi or crepitation. No use of accessory muscles of respiration.  CARDIOVASCULAR: S1, S2 normal. No murmurs, rubs, or gallops.  ABDOMEN: Soft, nontender, nondistended. Bowel sounds present. No organomegaly or mass.  EXTREMITIES: No pedal edema, cyanosis, or clubbing.  NEUROLOGIC: Cranial nerves II through  XII are intact. Muscle strength 5/5 in all extremities. Sensation intact. Gait not checked.  PSYCHIATRIC: The patient is alert and oriented x 3.  SKIN: No obvious rash, lesion, or ulcer.   Physical Exam LABORATORY PANEL:   CBC Recent Labs  Lab 06/28/2018 1111  WBC 8.2  HGB 12.4*  HCT 35.4*  PLT 264   ------------------------------------------------------------------------------------------------------------------  Chemistries  Recent Labs  Lab 07/11/2018 1111  NA 138  K 4.0  CL 105  CO2 25  GLUCOSE 118*  BUN 13  CREATININE 1.16  CALCIUM 8.9  AST 16  ALT 9  ALKPHOS 33*  BILITOT 0.7   ------------------------------------------------------------------------------------------------------------------  Cardiac Enzymes No results for input(s): TROPONINI in the last 168 hours. ------------------------------------------------------------------------------------------------------------------  RADIOLOGY:  Dg Chest Port 1 View  Result Date: 07/18/2018 CLINICAL DATA:  Right chest tube EXAM: PORTABLE CHEST 1 VIEW COMPARISON:  07/15/2018 FINDINGS: Right jugular central venous catheter placed. Tip is in the lower SVC. Right pigtail thoracostomy projects over the right upper lung zone. No pneumothorax. Stable right pleural effusion. IMPRESSION: New right thoracostomy. New right jugular central venous catheter. Tip is in the SVC without pneumothorax Stable right pleural effusion. Electronically Signed   By: Jolaine Click M.D.   On: 07/18/2018 08:51   Ct Image Guided Drainage By Percutaneous Catheter  Result Date: 07/04/2018 INDICATION: 67 year old with a loculated right hemothorax. Plan for chest tube placement and intrapleural thrombolytics therapy. EXAM: CT-GUIDED PLACEMENT OF RIGHT CHEST TUBE MEDICATIONS: Sedation medications ANESTHESIA/SEDATION: 4.0 mg IV Versed 150 mcg IV Fentanyl Moderate Sedation Time:  27 minutes The patient was continuously monitored during the procedure by the  interventional radiology nurse under my direct supervision. COMPLICATIONS: None immediate. TECHNIQUE: Informed written consent was obtained from the  patient after a thorough discussion of the procedural risks, benefits and alternatives. All questions were addressed. Maximal Sterile Barrier Technique was utilized including caps, mask, sterile gowns, sterile gloves, sterile drape, hand hygiene and skin antiseptic. A timeout was performed prior to the initiation of the procedure. PROCEDURE: Patient was placed supine on the CT scanner with the right arm elevated. Images through the chest were obtained. The right mid axillary region was prepped and draped in a sterile fashion using chlorhexidine. Skin and soft tissues were anesthetized with 1% lidocaine. Initially, a 10 cm Yueh catheter was directed into the pleural space but this was too short. 15 cm ,18 gauge trocar needle was directed in the pleural space with CT guidance and a small amount of brown fluid was aspirated. Stiff Amplatz wire was advanced into the pleural space. The tract was dilated to accommodate a 14 [place] multipurpose drain. Additional dark fluid was aspirated. The catheter was sutured to skin and attached to a PleurEvac device. FINDINGS: Complex right pleural effusion with thickened pleural wall. Dark brown fluid removed from the pleural space and compatible with old blood. Catheter is well positioned within the pleural space. Small pocket of gas adjacent to the chest tube is compatible with a complex or loculated collection. IMPRESSION: CT-guided placement of a 14 French right chest tube. Electronically Signed   By: Richarda Overlie M.D.   On: 2018/07/18 12:53    ASSESSMENT AND PLAN:  Patient 67 year old admitted with loculated pleural effusion on CT surgery service   *Right loculated pleural effusion likely hemothorax Status post right chest tube placement on 2018-07-18 by Dr. Oakes/cardiothoracic surgery  Chest tube management per  cardiothoracic surgery  *History of recurrent pulmonary embolism and DVT Vascular surgery consulted - S/p IVC filter placement  *Hypertension  continue therapy with lisinopril  *Glaucoma  continue eyedrops  *DVT prophylaxis with SCDs.   All the records are reviewed and case discussed with Care Management/Social Workerr. Management plans discussed with the patient, family and they are in agreement.  CODE STATUS: full  TOTAL TIME TAKING CARE OF THIS PATIENT: 35 minutes.     POSSIBLE D/C IN 3-5 DAYS, DEPENDING ON CLINICAL CONDITION.   Evelena Asa Kenyon Eichelberger M.D on 07/18/2018   Between 7am to 6pm - Pager - [phone]  After 6pm go to [email or web address] - password [email or web address]  [facility]  Office  [phone]  CC: Primary care physician; Sherrie Mustache, MD  Note: This dictation was prepared with Dragon dictation along with smaller phrase technology. Any transcriptional errors that result from this process are unintentional.

## 2018-07-19 ENCOUNTER — Inpatient Hospital Stay: Payer: Medicare PPO

## 2018-07-19 LAB — BPAM RBC
BLOOD PRODUCT EXPIRATION DATE: 201910202359
Blood Product Expiration Date: 201910202359
UNIT TYPE AND RH: 6200
Unit Type and Rh: 6200

## 2018-07-19 LAB — TYPE AND SCREEN
ABO/RH(D): A POS
Antibody Screen: NEGATIVE
UNIT DIVISION: 0
Unit division: 0

## 2018-07-19 LAB — BODY FLUID CULTURE
Culture: NO GROWTH
GRAM STAIN: NONE SEEN

## 2018-07-19 LAB — CHOLESTEROL, BODY FLUID: Cholesterol, Fluid: 99 mg/dL

## 2018-07-19 LAB — PREPARE RBC (CROSSMATCH)

## 2018-07-19 NOTE — Progress Notes (Signed)
[facility] - [place] at [facility]   PATIENT NAME: Jeff Leonard    MR#:  [id number]  DATE OF BIRTH:  1951-06-10  SUBJECTIVE:  CHIEF COMPLAINT:   Chief Complaint  Patient presents with  . Pleural Effusion  Patient without complaint, no events overnight per nursing staff, cardiothoracic input greatly appreciated  REVIEW OF SYSTEMS:  CONSTITUTIONAL: No fever, fatigue or weakness.  EYES: No blurred or double vision.  EARS, NOSE, AND THROAT: No tinnitus or ear pain.  RESPIRATORY: No cough, shortness of breath, wheezing or hemoptysis.  CARDIOVASCULAR: No chest pain, orthopnea, edema.  GASTROINTESTINAL: No nausea, vomiting, diarrhea or abdominal pain.  GENITOURINARY: No dysuria, hematuria.  ENDOCRINE: No polyuria, nocturia,  HEMATOLOGY: No anemia, easy bruising or bleeding SKIN: No rash or lesion. MUSCULOSKELETAL: No joint pain or arthritis.   NEUROLOGIC: No tingling, numbness, weakness.  PSYCHIATRY: No anxiety or depression.   ROS  DRUG ALLERGIES:  No Known Allergies  VITALS:  Blood pressure 110/73, pulse 75, temperature 98.6 F (37 C), temperature source Oral, resp. rate 20, height 6\' 1"  (1.854 m), weight 126.6 kg, SpO2 93 %.  PHYSICAL EXAMINATION:  GENERAL:  67 y.o.-year-old patient lying in the bed with no acute distress.  EYES: Pupils equal, round, reactive to light and accommodation. No scleral icterus. Extraocular muscles intact.  HEENT: Head atraumatic, normocephalic. Oropharynx and nasopharynx clear.  NECK:  Supple, no jugular venous distention. No thyroid enlargement, no tenderness.  LUNGS: Normal breath sounds bilaterally, no wheezing, rales,rhonchi or crepitation. No use of accessory muscles of respiration.  CARDIOVASCULAR: S1, S2 normal. No murmurs, rubs, or gallops.  ABDOMEN: Soft, nontender, nondistended. Bowel sounds present. No organomegaly or mass.  EXTREMITIES: No pedal edema, cyanosis, or clubbing.  NEUROLOGIC: Cranial nerves II through XII  are intact. Muscle strength 5/5 in all extremities. Sensation intact. Gait not checked.  PSYCHIATRIC: The patient is alert and oriented x 3.  SKIN: No obvious rash, lesion, or ulcer.   Physical Exam LABORATORY PANEL:   CBC Recent Labs  Lab 08-06-18 1111  WBC 8.2  HGB 12.4*  HCT 35.4*  PLT 264   ------------------------------------------------------------------------------------------------------------------  Chemistries  Recent Labs  Lab 08-06-18 1111  NA 138  K 4.0  CL 105  CO2 25  GLUCOSE 118*  BUN 13  CREATININE 1.16  CALCIUM 8.9  AST 16  ALT 9  ALKPHOS 33*  BILITOT 0.7   ------------------------------------------------------------------------------------------------------------------  Cardiac Enzymes No results for input(s): TROPONINI in the last 168 hours. ------------------------------------------------------------------------------------------------------------------  RADIOLOGY:  Dg Chest Port 1 View  Result Date: 07/18/2018 CLINICAL DATA:  Right chest tube EXAM: PORTABLE CHEST 1 VIEW COMPARISON:  07/15/2018 FINDINGS: Right jugular central venous catheter placed. Tip is in the lower SVC. Right pigtail thoracostomy projects over the right upper lung zone. No pneumothorax. Stable right pleural effusion. IMPRESSION: New right thoracostomy. New right jugular central venous catheter. Tip is in the SVC without pneumothorax Stable right pleural effusion. Electronically Signed   By: Jolaine Click M.D.   On: 07/18/2018 08:51    ASSESSMENT AND PLAN:  Patient 67 year old admitted with loculated pleural effusion on CT surgery service  *Right loculated pleural effusion likely hemothorax Status post right chest tube placement on July 17, 2018 by Dr. Oakes/cardiothoracic surgery  Cardiothoracic surgery input appreciated-may require decortication, ?  Reattempt with TPA, question will need for transfer to [facility] -await further cardiothoracic surgery recommendations    *History of recurrent pulmonary embolism and DVT Vascular surgery consulted - S/p IVC filter placement  *Hypertension  continue therapy with lisinopril  *Glaucoma  continue eyedrops  *DVT prophylaxis with SCDs.   All the records are reviewed and case discussed with Care Management/Social Workerr. Management plans discussed with the patient, family and they are in agreement.  CODE STATUS: full  TOTAL TIME TAKING CARE OF THIS PATIENT: 35 minutes.     POSSIBLE D/C IN 3-5 DAYS, DEPENDING ON CLINICAL CONDITION.   Evelena Asa Salary M.D on 07/19/2018   Between 7am to 6pm - Pager - [phone]  After 6pm go to [email or web address] - password [email or web address]  [facility] [place] [facility]  Office  [phone]  CC: Primary care physician; Sherrie Mustache, MD  Note: This dictation was prepared with Dragon dictation along with smaller phrase technology. Any transcriptional errors that result from this process are unintentional.

## 2018-07-19 NOTE — Progress Notes (Signed)
[facility] chest drainage system was labeled with two lab stickers and sent to the lab as discussed with Dr. Thelma Barge. New drainage system was set up and is functioning without complications.

## 2018-07-19 NOTE — Progress Notes (Signed)
  Patient ID: Jeff Leonard, male   DOB: 11-07-1950, 67 y.o.   MRN: [id number]  HISTORY: Had some pain in his right chest after tube was reconnected to suction.  He ate well.  No fever.   Vitals:   07/18/18 2035 07/19/18 0440  BP: 111/72 110/73  Pulse: 77 75  Resp: 20 20  Temp: 98.2 F (36.8 C) 98.6 F (37 C)  SpO2: 93% 93%     EXAM:    Resp: Lungs are clear bilaterally.  No respiratory distress, normal effort. Heart:  Regular without murmurs Abd:  Abdomen is soft, non distended and non tender. No masses are palpable.  There is no rebound and no guarding.  Neurological: Alert and oriented to person, place, and time. Coordination normal.  Skin: Skin is warm and dry. No rash noted. No diaphoretic. No erythema. No pallor.  Psychiatric: Normal mood and affect. Normal behavior. Judgment and thought content normal.   No air leak.  Drainage is serosanguinous from tube  ASSESSMENT: Independent review of CXRay shows partial clearing of pleural space but with trapped lung    PLAN:   Reviewed the options.  Can try TPA again but doubt success.  May need decortication.  He is aware that I will be out of town the end of the week and he may request transfer to [facility].  We will repeat his chest CT and send the pleural fluid for cytology.  All cultures are negative to date.    [name], MDPatient ID: Jeff Leonard, male   DOB: July 11, 1951, 67 y.o.   MRN: [id number]

## 2018-07-20 LAB — ACID FAST SMEAR (AFB): ACID FAST SMEAR - AFSCU2: NEGATIVE

## 2018-07-20 LAB — ACID FAST SMEAR (AFB, MYCOBACTERIA)

## 2018-07-20 MED ORDER — IPRATROPIUM-ALBUTEROL 0.5-2.5 (3) MG/3ML IN SOLN
RESPIRATORY_TRACT | Status: AC
  Filled 2018-07-20: qty 3

## 2018-07-20 MED ORDER — IPRATROPIUM-ALBUTEROL 0.5-2.5 (3) MG/3ML IN SOLN
3.0000 mL | Freq: Once | RESPIRATORY_TRACT | Status: AC
  Administered 2018-07-20: 3 mL via RESPIRATORY_TRACT

## 2018-07-20 NOTE — Care Management Important Message (Signed)
Copy of signed IM left with patient in room.  

## 2018-07-20 NOTE — Progress Notes (Signed)
[employer] [facility] - [place] at [facility]   PATIENT NAME: Jeff Leonard    MR#:  [id number]  DATE OF BIRTH:  Mar 02, 1951  SUBJECTIVE:  CHIEF COMPLAINT:   Chief Complaint  Patient presents with  . Pleural Effusion  Patient without complaint, no events overnight per nursing staff, cardiothoracic input greatly appreciated  REVIEW OF SYSTEMS:  CONSTITUTIONAL: No fever, fatigue or weakness.  EYES: No blurred or double vision.  EARS, NOSE, AND THROAT: No tinnitus or ear pain.  RESPIRATORY: No cough, shortness of breath, wheezing or hemoptysis.  CARDIOVASCULAR: No chest pain, orthopnea, edema.  GASTROINTESTINAL: No nausea, vomiting, diarrhea or abdominal pain.  GENITOURINARY: No dysuria, hematuria.  ENDOCRINE: No polyuria, nocturia,  HEMATOLOGY: No anemia, easy bruising or bleeding SKIN: No rash or lesion. MUSCULOSKELETAL: No joint pain or arthritis.   NEUROLOGIC: No tingling, numbness, weakness.  PSYCHIATRY: No anxiety or depression.   ROS  DRUG ALLERGIES:  No Known Allergies  VITALS:  Blood pressure 130/88, pulse 83, temperature 97.6 F (36.4 C), temperature source Oral, resp. rate 20, height 6\' 1"  (1.854 m), weight 126.9 kg, SpO2 96 %.  PHYSICAL EXAMINATION:  GENERAL:  67 y.o.-year-old patient lying in the bed with no acute distress.  EYES: Pupils equal, round, reactive to light and accommodation. No scleral icterus. Extraocular muscles intact.  HEENT: Head atraumatic, normocephalic. Oropharynx and nasopharynx clear.  NECK:  Supple, no jugular venous distention. No thyroid enlargement, no tenderness.  LUNGS: Normal breath sounds bilaterally, no wheezing, rales,rhonchi or crepitation. No use of accessory muscles of respiration.  CARDIOVASCULAR: S1, S2 normal. No murmurs, rubs, or gallops.  ABDOMEN: Soft, nontender, nondistended. Bowel sounds present. No organomegaly or mass.  EXTREMITIES: No pedal edema, cyanosis, or clubbing.  NEUROLOGIC: Cranial nerves II through  XII are intact. Muscle strength 5/5 in all extremities. Sensation intact. Gait not checked.  PSYCHIATRIC: The patient is alert and oriented x 3.  SKIN: No obvious rash, lesion, or ulcer.   Physical Exam LABORATORY PANEL:   CBC Recent Labs  Lab 08-09-18 1111  WBC 8.2  HGB 12.4*  HCT 35.4*  PLT 264   ------------------------------------------------------------------------------------------------------------------  Chemistries  Recent Labs  Lab 08-09-18 1111  NA 138  K 4.0  CL 105  CO2 25  GLUCOSE 118*  BUN 13  CREATININE 1.16  CALCIUM 8.9  AST 16  ALT 9  ALKPHOS 33*  BILITOT 0.7   ------------------------------------------------------------------------------------------------------------------  Cardiac Enzymes No results for input(s): TROPONINI in the last 168 hours. ------------------------------------------------------------------------------------------------------------------  RADIOLOGY:  Ct Chest Wo Contrast  Result Date: 07/19/2018 CLINICAL DATA:  Chest tube EXAM: CT CHEST WITHOUT CONTRAST TECHNIQUE: Multidetector CT imaging of the chest was performed following the standard protocol without IV contrast. COMPARISON:  Chest CT 2018/08/09 FINDINGS: Cardiovascular: Coronary artery calcification and aortic atherosclerotic calcification. Port in the anterior chest wall with tip in distal SVC. Mediastinum/Nodes: No axillary or supraclavicular adenopathy. No mediastinal or hilar adenopathy. No pericardial effusion. Esophagus normal. Lungs/Pleura: Interval evacuation of the loculated pleural fluid at the RIGHT Upper lobe following chest tube placement. There is a new gas collection occupying the space of the pleural fluid. The pigtail catheter is within this gas collection. Small amount residual pleural fluid does remain at the RIGHT lung base. There is atelectasis within the RIGHT lower lobe. LEFT upper lobe is clear. The LEFT lung is mildly hyperexpanded. Upper Abdomen:  Limited view of the liver, kidneys, pancreas are unremarkable. Normal adrenal glands. Musculoskeletal: No aggressive osseous lesion. IMPRESSION: 1. New gas collection replaces  the loculated pleural fluid in the RIGHT upper lobe (hydropneumothorax) following chest tube placement. The chest tube within the inferior aspect of this new hydropneumothorax. 2. Atelectasis within the RIGHT upper lobe. 3. LEFT lung clear. Electronically Signed   By: Genevive Bi M.D.   On: 07/19/2018 15:49   Dg Chest Port 1 View  Result Date: 07/19/2018 CLINICAL DATA:  Postop check. EXAM: PORTABLE CHEST 1 VIEW COMPARISON:  July 18, 2018 FINDINGS: A hydropneumothorax is now identified. The air component was not present on the July 18, 2018 study. A right chest tube remains with the pigtail located peripherally. The tube is been pulled back somewhat in the interval. Stable right central line terminating near the caval atrial junction. No left-sided pneumothorax. No other change. Postoperative changes on the right. IMPRESSION: 1. There is a hydropneumothorax. The air component was not present yesterday. The chest tube is been pulled back but remains. Postoperative changes on the right. 2. No other changes. These results will be called to the ordering clinician or representative by the Radiologist Assistant, and communication documented in the PACS or zVision Dashboard. Electronically Signed   By: Gerome Sam III M.D   On: 07/19/2018 14:15    ASSESSMENT AND PLAN:  Patient 67 year old admitted with loculated pleural effusion on CT surgery service  *Right loculated pleural effusion Stable likely hemothorax S/p right chest tube placement on July 17, 2018 by Dr. Oakes/cardiothoracic surgery-plans for pleurodesis/decortication on Wednesday later this week  *History of recurrent pulmonary embolism and DVT Vascular surgery consulted - S/p IVC filter placement  *Hypertension  continue therapy with  lisinopril  *Glaucoma  continue eyedrops  *DVT prophylaxis with SCDs.   All the records are reviewed and case discussed with Care Management/Social Workerr. Management plans discussed with the patient, family and they are in agreement.  CODE STATUS: full  TOTAL TIME TAKING CARE OF THIS PATIENT: 35 minutes.     POSSIBLE D/C IN 3-5 DAYS, DEPENDING ON CLINICAL CONDITION.   Evelena Asa Salary M.D on 07/20/2018   Between 7am to 6pm - Pager - [phone]  After 6pm go to [email or web address] - password [email or web address]  [facility] [place] [facility]  Office  [phone]  CC: Primary care physician; Sherrie Mustache, MD  Note: This dictation was prepared with Dragon dictation along with smaller phrase technology. Any transcriptional errors that result from this process are unintentional.

## 2018-07-20 NOTE — Anesthesia Preprocedure Evaluation (Addendum)
Anesthesia Evaluation  Patient identified by MRN, date of birth, ID band Patient awake    Reviewed: Allergy & Precautions, H&P , NPO status , Patient's Chart, lab work & pertinent test results, reviewed documented beta blocker date and time   Airway Mallampati: III  TM Distance: >3 FB     Dental  (+) Chipped   Pulmonary shortness of breath, sleep apnea , PE Right pleural effusion that is not clearing up with current treatments.  This is s/p DVT with PE and chest tube insertion.  Cultures have been negative to date.          Cardiovascular + Past MI and + DVT       Neuro/Psych    GI/Hepatic   Endo/Other    Renal/GU      Musculoskeletal   Abdominal   Peds  Hematology   Anesthesia Other Findings Past Medical History: No date: Glaucoma No date: Hormone disorder No date: Hx of pulmonary embolus No date: Myocardial infarction (HCC) No date: Past heart attack No date: Sleep apnea sats 95%. R hemothorax. EKG ok. Echo ok 1 yr ago. IVC. Obese.  Reproductive/Obstetrics                            Anesthesia Physical Anesthesia Plan  ASA: III  Anesthesia Plan: General   Post-op Pain Management:    Induction: Intravenous  PONV Risk Score and Plan:   Airway Management Planned: Double Lumen EBT  Additional Equipment:   Intra-op Plan:   Post-operative Plan:   Informed Consent: I have reviewed the patients History and Physical, chart, labs and discussed the procedure including the risks, benefits and alternatives for the proposed anesthesia with the patient or authorized representative who has indicated his/her understanding and acceptance.     Plan Discussed with: CRNA  Anesthesia Plan Comments:         Anesthesia Quick Evaluation

## 2018-07-20 NOTE — Progress Notes (Signed)
  Patient ID: Jeff Leonard, male   DOB: 03-28-1951, 67 y.o.   MRN: [id number]  HISTORY: No new issues.  Not short of breath.  Walked some yesterday without problems.   Vitals:   07/20/18 0411 07/20/18 0445  BP: 130/88   Pulse: 83   Resp: 20   Temp: 97.6 F (36.4 C)   SpO2: 95% 96%     EXAM:    Resp: Lungs are clear bilaterally.  No respiratory distress, normal effort. Heart:  Regular without murmurs Abd:  Abdomen is soft, non distended and non tender. No masses are palpable.  There is no rebound and no guarding.  Neurological: Alert and oriented to person, place, and time. Coordination normal.  Skin: Skin is warm and dry. No rash noted. No diaphoretic. No erythema. No pallor.  Psychiatric: Normal mood and affect. Normal behavior. Judgment and thought content normal.   No air leak.  Minimal serous drainage from chest tube Independent review of CT scan shows right sided hydropneumothorax without lung mass   ASSESSMENT: Trapped right lung  Cultures negative to date Cytology sent yesterday   PLAN:   Reviewed options with patient.  I believe that decortication is likely required and I discussed this with him.  He is aware of the operation and the hospital stay.  He would like to stay here for surgery.  We plan thoracotomy on Wednesday morning.     [name], MDPatient ID: Jeff Leonard, male   DOB: [birth year], 67 y.o.   MRN: [id number]

## 2018-07-21 ENCOUNTER — Encounter: Payer: Self-pay | Admitting: Anesthesiology

## 2018-07-21 LAB — COMPREHENSIVE METABOLIC PANEL
ALK PHOS: 37 U/L — AB (ref 38–126)
ALT: 14 U/L (ref 0–44)
ANION GAP: 6 (ref 5–15)
AST: 20 U/L (ref 15–41)
Albumin: 3.2 g/dL — ABNORMAL LOW (ref 3.5–5.0)
BUN: 13 mg/dL (ref 8–23)
CALCIUM: 8.9 mg/dL (ref 8.9–10.3)
CO2: 26 mmol/L (ref 22–32)
Chloride: 105 mmol/L (ref 98–111)
Creatinine, Ser: 1 mg/dL (ref 0.61–1.24)
GFR calc Af Amer: >60 mL/min (ref >60)
GFR calc non Af Amer: >60 mL/min (ref >60)
GLUCOSE: 120 mg/dL — AB (ref 70–99)
POTASSIUM: 4 mmol/L (ref 3.5–5.1)
Sodium: 137 mmol/L (ref 135–145)
Total Bilirubin: 0.5 mg/dL (ref 0.3–1.2)
Total Protein: 7.2 g/dL (ref 6.5–8.1)

## 2018-07-21 LAB — APTT: APTT: 46 s — AB (ref 24–36)

## 2018-07-21 LAB — PROTIME-INR
INR: 1.14
Prothrombin Time: 14.5 seconds (ref 11.4–15.2)

## 2018-07-21 LAB — CBC
HCT: 34.3 % — ABNORMAL LOW (ref 40.0–52.0)
HEMOGLOBIN: 11.9 g/dL — AB (ref 13.0–18.0)
MCH: 32.2 pg (ref 26.0–34.0)
MCHC: 34.6 g/dL (ref 32.0–36.0)
MCV: 92.8 fL (ref 80.0–100.0)
Platelets: 221 10*3/uL (ref 150–440)
RBC: 3.69 MIL/uL — ABNORMAL LOW (ref 4.40–5.90)
RDW: 13.7 % (ref 11.5–14.5)
WBC: 8.6 10*3/uL (ref 3.8–10.6)

## 2018-07-21 LAB — PREPARE RBC (CROSSMATCH)

## 2018-07-21 MED ORDER — CEFAZOLIN SODIUM-DEXTROSE 2-4 GM/100ML-% IV SOLN
2.0000 g | INTRAVENOUS | Status: AC
  Administered 2018-07-22 (×2): 2 g via INTRAVENOUS
  Filled 2018-07-21: qty 100

## 2018-07-21 NOTE — Progress Notes (Signed)
[employer] [facility] - [place] at [facility]   PATIENT NAME: Jeff Leonard    MR#:  [id number]  DATE OF BIRTH:  [birth year]  SUBJECTIVE:  CHIEF COMPLAINT:   Chief Complaint  Patient presents with  . Pleural Effusion  Patient without complaint, no events overnight per nursing staff, cardiothoracic input greatly appreciated  REVIEW OF SYSTEMS:  CONSTITUTIONAL: No fever, fatigue or weakness.  EYES: No blurred or double vision.  EARS, NOSE, AND THROAT: No tinnitus or ear pain.  RESPIRATORY: No cough, shortness of breath, wheezing or hemoptysis.  CARDIOVASCULAR: No chest pain, orthopnea, edema.  GASTROINTESTINAL: No nausea, vomiting, diarrhea or abdominal pain.  GENITOURINARY: No dysuria, hematuria.  ENDOCRINE: No polyuria, nocturia,  HEMATOLOGY: No anemia, easy bruising or bleeding SKIN: No rash or lesion. MUSCULOSKELETAL: No joint pain or arthritis.   NEUROLOGIC: No tingling, numbness, weakness.  PSYCHIATRY: No anxiety or depression.   ROS  DRUG ALLERGIES:  No Known Allergies  VITALS:  Blood pressure 118/78, pulse 72, temperature 97.8 F (36.6 C), temperature source Oral, resp. rate 18, height 6\' 1"  (1.854 m), weight 127.1 kg, SpO2 97 %.  PHYSICAL EXAMINATION:  GENERAL:  67 y.o.-year-old patient lying in the bed with no acute distress.  EYES: Pupils equal, round, reactive to light and accommodation. No scleral icterus. Extraocular muscles intact.  HEENT: Head atraumatic, normocephalic. Oropharynx and nasopharynx clear.  NECK:  Supple, no jugular venous distention. No thyroid enlargement, no tenderness.  LUNGS: Normal breath sounds bilaterally, no wheezing, rales,rhonchi or crepitation. No use of accessory muscles of respiration.  CARDIOVASCULAR: S1, S2 normal. No murmurs, rubs, or gallops.  ABDOMEN: Soft, nontender, nondistended. Bowel sounds present. No organomegaly or mass.  EXTREMITIES: No pedal edema, cyanosis, or clubbing.  NEUROLOGIC: Cranial nerves II through  XII are intact. Muscle strength 5/5 in all extremities. Sensation intact. Gait not checked.  PSYCHIATRIC: The patient is alert and oriented x 3.  SKIN: No obvious rash, lesion, or ulcer.   Physical Exam LABORATORY PANEL:   CBC Recent Labs  Lab 07/13/2018 1111  WBC 8.2  HGB 12.4*  HCT 35.4*  PLT 264   ------------------------------------------------------------------------------------------------------------------  Chemistries  Recent Labs  Lab 07/03/2018 1111  NA 138  K 4.0  CL 105  CO2 25  GLUCOSE 118*  BUN 13  CREATININE 1.16  CALCIUM 8.9  AST 16  ALT 9  ALKPHOS 33*  BILITOT 0.7   ------------------------------------------------------------------------------------------------------------------  Cardiac Enzymes No results for input(s): TROPONINI in the last 168 hours. ------------------------------------------------------------------------------------------------------------------  RADIOLOGY:  No results found.  ASSESSMENT AND PLAN:  Patient 67 year old admitted with loculated pleural effusion on CT surgery service  *Right loculated pleural effusion Stable likely hemothorax S/p right chest tube placement on 07-26-2018 by Dr. Oakes/cardiothoracic surgery-plans for pleurodesis/decortication on tomorrow   *History of recurrent pulmonary embolism and DVT Vascular surgery consulted - S/p IVC filter placement  *Hypertension  continue therapy with lisinopril  *Glaucoma  continue eyedrops  *DVT prophylaxis with SCDs.   All the records are reviewed and case discussed with Care Management/Social Workerr. Management plans discussed with the patient, family and they are in agreement.  CODE STATUS: full  TOTAL TIME TAKING CARE OF THIS PATIENT: 35 minutes.     POSSIBLE D/C IN 3-5 DAYS, DEPENDING ON CLINICAL CONDITION.   Evelena Asa Salary M.D on 07/21/2018   Between 7am to 6pm - Pager - [phone]  After 6pm go to [email or web address] - Geophysicist/field seismologist  [facility] [place] [facility]  Office  [phone]  CC:  Primary care physician; Sherrie Mustache, MD  Note: This dictation was prepared with Dragon dictation along with smaller phrase technology. Any transcriptional errors that result from this process are unintentional.

## 2018-07-21 NOTE — Progress Notes (Signed)
Patient ID: Jeff Leonard, male   DOB: 02/17/1951, 67 y.o.   MRN: [id number]   He states that he had a pretty quiet night.  He has no complaints today.  He is been afebrile.  He is able to walk and has been doing so in the room.  I did instruct the nurses to discontinue the suction so that he could ambulate more.  His lungs are equal bilaterally.  His heart is regular.  His abdomen is soft and nontender.  His wounds have a blister on the posterior portion which measures about 3 cm.  It is fluid-filled.  There is no erythema.  This is from the tape that have been on the dressings.  Cytology is still pending.  I did explain to him again the indications and risks of right thoracotomy with decortication of the lung.  He understands the risks of bleeding, infection, air leak, recurrence and death.  Alternatives were reviewed.  He would like [place] to proceed.

## 2018-07-21 DEATH — deceased

## 2018-07-22 ENCOUNTER — Inpatient Hospital Stay: Payer: Medicare PPO

## 2018-07-22 ENCOUNTER — Encounter: Admission: EM | Disposition: E | Payer: Self-pay | Source: Home / Self Care | Attending: Family Medicine

## 2018-07-22 ENCOUNTER — Inpatient Hospital Stay: Payer: Medicare PPO | Admitting: Anesthesiology

## 2018-07-22 DIAGNOSIS — J942 Hemothorax: Secondary | ICD-10-CM

## 2018-07-22 HISTORY — PX: THORACOTOMY: SHX5074

## 2018-07-22 HISTORY — PX: FIBEROPTIC BRONCHOSCOPY: SHX5367

## 2018-07-22 LAB — CBC
HCT: 33.7 % — ABNORMAL LOW (ref 40.0–52.0)
HCT: 35 % — ABNORMAL LOW (ref 40.0–52.0)
HEMOGLOBIN: 11.9 g/dL — AB (ref 13.0–18.0)
Hemoglobin: 11.6 g/dL — ABNORMAL LOW (ref 13.0–18.0)
MCH: 32.2 pg (ref 26.0–34.0)
MCH: 32.3 pg (ref 26.0–34.0)
MCHC: 34.3 g/dL (ref 32.0–36.0)
MCHC: 34 g/dL (ref 32.0–36.0)
MCV: 93.8 fL (ref 80.0–100.0)
MCV: 95.1 fL (ref 80.0–100.0)
Platelets: 215 10*3/uL (ref 150–440)
Platelets: 209 10*3/uL (ref 150–440)
RBC: 3.6 MIL/uL — ABNORMAL LOW (ref 4.40–5.90)
RBC: 3.68 MIL/uL — AB (ref 4.40–5.90)
RDW: 14 % (ref 11.5–14.5)
RDW: 13.6 % (ref 11.5–14.5)
WBC: 8.6 10*3/uL (ref 3.8–10.6)
WBC: 13.9 10*3/uL — ABNORMAL HIGH (ref 3.8–10.6)

## 2018-07-22 LAB — BASIC METABOLIC PANEL
Anion gap: 7 (ref 5–15)
BUN: 14 mg/dL (ref 8–23)
CALCIUM: 8.5 mg/dL — AB (ref 8.9–10.3)
CHLORIDE: 104 mmol/L (ref 98–111)
CO2: 24 mmol/L (ref 22–32)
Creatinine, Ser: 1.06 mg/dL (ref 0.61–1.24)
GFR calc Af Amer: >60 mL/min (ref >60)
GFR calc non Af Amer: >60 mL/min (ref >60)
Glucose, Bld: 184 mg/dL — ABNORMAL HIGH (ref 70–99)
POTASSIUM: 4.1 mmol/L (ref 3.5–5.1)
Sodium: 135 mmol/L (ref 135–145)

## 2018-07-22 LAB — GLUCOSE, CAPILLARY: GLUCOSE-CAPILLARY: 152 mg/dL — AB (ref 70–99)

## 2018-07-22 LAB — CYTOLOGY - NON PAP

## 2018-07-22 LAB — MRSA PCR SCREENING: MRSA by PCR: NEGATIVE

## 2018-07-22 SURGERY — THORACOTOMY, MAJOR
Anesthesia: General | Laterality: Right

## 2018-07-22 MED ORDER — MIDAZOLAM HCL 2 MG/2ML IJ SOLN
INTRAMUSCULAR | Status: AC
  Filled 2018-07-22: qty 2

## 2018-07-22 MED ORDER — FENTANYL CITRATE (PF) 100 MCG/2ML IJ SOLN
INTRAMUSCULAR | Status: AC
  Filled 2018-07-22: qty 2

## 2018-07-22 MED ORDER — PROPOFOL 10 MG/ML IV BOLUS
INTRAVENOUS | Status: AC
  Filled 2018-07-22: qty 20

## 2018-07-22 MED ORDER — MIDAZOLAM HCL 2 MG/2ML IJ SOLN
INTRAMUSCULAR | Status: DC | PRN
  Administered 2018-07-22 (×2): 1 mg via INTRAVENOUS

## 2018-07-22 MED ORDER — OXYCODONE-ACETAMINOPHEN 5-325 MG PO TABS
1.0000 | ORAL_TABLET | ORAL | Status: DC | PRN
  Administered 2018-07-22 – 2018-07-27 (×12): 2 via ORAL
  Filled 2018-07-22 (×12): qty 2

## 2018-07-22 MED ORDER — DEXAMETHASONE SODIUM PHOSPHATE 10 MG/ML IJ SOLN
INTRAMUSCULAR | Status: AC
  Filled 2018-07-22: qty 1

## 2018-07-22 MED ORDER — LIDOCAINE HCL (CARDIAC) PF 100 MG/5ML IV SOSY
PREFILLED_SYRINGE | INTRAVENOUS | Status: DC | PRN
  Administered 2018-07-22: 100 mg via INTRAVENOUS

## 2018-07-22 MED ORDER — ROCURONIUM BROMIDE 50 MG/5ML IV SOLN
INTRAVENOUS | Status: AC
  Filled 2018-07-22: qty 1

## 2018-07-22 MED ORDER — FENTANYL CITRATE (PF) 100 MCG/2ML IJ SOLN
25.0000 ug | INTRAMUSCULAR | Status: DC | PRN
  Administered 2018-07-22 (×4): 25 ug via INTRAVENOUS

## 2018-07-22 MED ORDER — FENTANYL CITRATE (PF) 100 MCG/2ML IJ SOLN
INTRAMUSCULAR | Status: DC | PRN
  Administered 2018-07-22 (×2): 50 ug via INTRAVENOUS
  Administered 2018-07-22: 25 ug via INTRAVENOUS
  Administered 2018-07-22 (×5): 50 ug via INTRAVENOUS

## 2018-07-22 MED ORDER — DEXTROSE-NACL 5-0.45 % IV SOLN
INTRAVENOUS | Status: DC
  Administered 2018-07-22 – 2018-07-23 (×2): via INTRAVENOUS

## 2018-07-22 MED ORDER — SEVOFLURANE IN SOLN
RESPIRATORY_TRACT | Status: AC
  Filled 2018-07-22: qty 250

## 2018-07-22 MED ORDER — MORPHINE SULFATE (PF) 4 MG/ML IV SOLN
1.0000 mg | INTRAVENOUS | Status: DC | PRN
  Administered 2018-07-22: 1 mg via INTRAVENOUS
  Administered 2018-07-22 – 2018-07-23 (×3): 2 mg via INTRAVENOUS
  Filled 2018-07-22 (×4): qty 1

## 2018-07-22 MED ORDER — ONDANSETRON HCL 4 MG/2ML IJ SOLN
4.0000 mg | Freq: Four times a day (QID) | INTRAMUSCULAR | Status: DC | PRN
  Administered 2018-07-25 – 2018-07-26 (×3): 4 mg via INTRAVENOUS
  Filled 2018-07-22 (×5): qty 2

## 2018-07-22 MED ORDER — IPRATROPIUM-ALBUTEROL 0.5-2.5 (3) MG/3ML IN SOLN
RESPIRATORY_TRACT | Status: AC
  Administered 2018-07-22: 3 mL via RESPIRATORY_TRACT
  Filled 2018-07-22: qty 3

## 2018-07-22 MED ORDER — DEXAMETHASONE SODIUM PHOSPHATE 10 MG/ML IJ SOLN
INTRAMUSCULAR | Status: DC | PRN
  Administered 2018-07-22: 10 mg via INTRAVENOUS

## 2018-07-22 MED ORDER — TRAMADOL HCL 50 MG PO TABS
50.0000 mg | ORAL_TABLET | Freq: Four times a day (QID) | ORAL | Status: DC
  Administered 2018-07-22 – 2018-07-27 (×13): 50 mg via ORAL
  Filled 2018-07-22 (×14): qty 1

## 2018-07-22 MED ORDER — TIMOLOL MALEATE 0.5 % OP SOLN
1.0000 [drp] | Freq: Every day | OPHTHALMIC | Status: DC
  Administered 2018-07-23 – 2018-07-27 (×5): 1 [drp] via OPHTHALMIC
  Filled 2018-07-22: qty 5

## 2018-07-22 MED ORDER — SODIUM CHLORIDE 0.9 % IV SOLN
INTRAVENOUS | Status: DC | PRN
  Administered 2018-07-22: 70 mL

## 2018-07-22 MED ORDER — PROPOFOL 10 MG/ML IV BOLUS
INTRAVENOUS | Status: DC | PRN
  Administered 2018-07-22: 160 mg via INTRAVENOUS

## 2018-07-22 MED ORDER — SUGAMMADEX SODIUM 200 MG/2ML IV SOLN
INTRAVENOUS | Status: DC | PRN
  Administered 2018-07-22: 254.2 mg via INTRAVENOUS

## 2018-07-22 MED ORDER — ROCURONIUM BROMIDE 100 MG/10ML IV SOLN
INTRAVENOUS | Status: DC | PRN
  Administered 2018-07-22 (×3): 20 mg via INTRAVENOUS
  Administered 2018-07-22: 25 mg via INTRAVENOUS
  Administered 2018-07-22: 10 mg via INTRAVENOUS
  Administered 2018-07-22: 5 mg via INTRAVENOUS

## 2018-07-22 MED ORDER — ALBUTEROL SULFATE (2.5 MG/3ML) 0.083% IN NEBU
2.5000 mg | INHALATION_SOLUTION | RESPIRATORY_TRACT | Status: DC
  Administered 2018-07-22 – 2018-07-25 (×10): 2.5 mg via RESPIRATORY_TRACT
  Filled 2018-07-22 (×11): qty 3

## 2018-07-22 MED ORDER — ONDANSETRON HCL 4 MG/2ML IJ SOLN
INTRAMUSCULAR | Status: DC | PRN
  Administered 2018-07-22 (×2): 4 mg via INTRAVENOUS

## 2018-07-22 MED ORDER — IPRATROPIUM-ALBUTEROL 0.5-2.5 (3) MG/3ML IN SOLN
3.0000 mL | Freq: Once | RESPIRATORY_TRACT | Status: AC
  Administered 2018-07-22: 3 mL via RESPIRATORY_TRACT

## 2018-07-22 MED ORDER — SUCCINYLCHOLINE CHLORIDE 20 MG/ML IJ SOLN
INTRAMUSCULAR | Status: DC | PRN
  Administered 2018-07-22: 100 mg via INTRAVENOUS

## 2018-07-22 MED ORDER — CEFAZOLIN SODIUM-DEXTROSE 2-4 GM/100ML-% IV SOLN
2.0000 g | Freq: Three times a day (TID) | INTRAVENOUS | Status: AC
  Administered 2018-07-22 – 2018-07-23 (×2): 2 g via INTRAVENOUS
  Filled 2018-07-22 (×2): qty 100

## 2018-07-22 MED ORDER — FENTANYL CITRATE (PF) 100 MCG/2ML IJ SOLN
INTRAMUSCULAR | Status: AC
  Administered 2018-07-22: 25 ug via INTRAVENOUS
  Filled 2018-07-22: qty 2

## 2018-07-22 MED ORDER — DORZOLAMIDE HCL 2 % OP SOLN
1.0000 [drp] | Freq: Every day | OPHTHALMIC | Status: DC
  Administered 2018-07-23 – 2018-07-27 (×5): 1 [drp] via OPHTHALMIC
  Filled 2018-07-22: qty 10

## 2018-07-22 MED ORDER — LATANOPROST 0.005 % OP SOLN
1.0000 [drp] | Freq: Every day | OPHTHALMIC | Status: DC
  Administered 2018-07-22 – 2018-07-28 (×7): 1 [drp] via OPHTHALMIC
  Filled 2018-07-22: qty 2.5

## 2018-07-22 MED ORDER — ONDANSETRON HCL 4 MG/2ML IJ SOLN: 4.0000 mg | Freq: Once | INTRAMUSCULAR | Status: DC | PRN

## 2018-07-22 MED ORDER — ACETAMINOPHEN 10 MG/ML IV SOLN
INTRAVENOUS | Status: DC | PRN
  Administered 2018-07-22: 1000 mg via INTRAVENOUS

## 2018-07-22 MED ORDER — BISACODYL 5 MG PO TBEC
10.0000 mg | DELAYED_RELEASE_TABLET | Freq: Every day | ORAL | Status: DC
  Administered 2018-07-23 – 2018-07-27 (×5): 10 mg via ORAL
  Filled 2018-07-22 (×5): qty 2

## 2018-07-22 MED ORDER — FENTANYL CITRATE (PF) 250 MCG/5ML IJ SOLN
INTRAMUSCULAR | Status: AC
  Filled 2018-07-22: qty 5

## 2018-07-22 MED ORDER — BUPIVACAINE HCL 0.25 % IJ SOLN
INTRAMUSCULAR | Status: DC | PRN
  Administered 2018-07-22: 30 mL

## 2018-07-22 MED ORDER — SODIUM CHLORIDE 0.9 % IV SOLN
INTRAVENOUS | Status: DC | PRN
  Administered 2018-07-22: 08:00:00 via INTRAVENOUS

## 2018-07-22 MED ORDER — CYCLOBENZAPRINE HCL 10 MG PO TABS
5.0000 mg | ORAL_TABLET | Freq: Three times a day (TID) | ORAL | Status: DC
  Administered 2018-07-22 – 2018-07-25 (×8): 5 mg via ORAL
  Filled 2018-07-22: qty 1
  Filled 2018-07-22: qty 0.5
  Filled 2018-07-22: qty 1
  Filled 2018-07-22 (×3): qty 0.5
  Filled 2018-07-22 (×4): qty 1
  Filled 2018-07-22: qty 0.5

## 2018-07-22 MED ORDER — ACETAMINOPHEN NICU IV SYRINGE 10 MG/ML
INTRAVENOUS | Status: AC
  Filled 2018-07-22: qty 1

## 2018-07-22 MED ORDER — ACETAMINOPHEN 325 MG PO TABS: 650.0000 mg | ORAL_TABLET | Freq: Four times a day (QID) | ORAL | Status: DC | PRN

## 2018-07-22 SURGICAL SUPPLY — 77 items
APL SKNCLS STERI-STRIP NONHPOA (GAUZE/BANDAGES/DRESSINGS)
BENZOIN TINCTURE PRP APPL 2/3 (GAUZE/BANDAGES/DRESSINGS) ×1 IMPLANT
BNDG COHESIVE 4X5 TAN STRL (GAUZE/BANDAGES/DRESSINGS) ×1 IMPLANT
BRONCHOSCOPE PED SLIM DISP (MISCELLANEOUS) ×2 IMPLANT
CANISTER SUCT 1200ML W/VALVE (MISCELLANEOUS) ×2 IMPLANT
CATH THOR STR 32F 8032 SOFT WA (CATHETERS) IMPLANT
CATH THORACIC RT ANG 32FR SOFT (CATHETERS) IMPLANT
CATH URET ROBINSON 16FR STRL (CATHETERS) ×2 IMPLANT
CHLORAPREP W/TINT 26ML (MISCELLANEOUS) ×4 IMPLANT
CNTNR SPEC 2.5X3XGRAD LEK (MISCELLANEOUS)
CONN REDUCER 3/8X3/8X3/8Y (CONNECTOR) ×6
CONNECTOR REDUCER 3/8X3/8 (MISCELLANEOUS) ×2 IMPLANT
CONNECTOR REDUCER 3/8X3/8X3/8Y (CONNECTOR) IMPLANT
CONT SPEC 4OZ STER OR WHT (MISCELLANEOUS)
CONT SPEC 4OZ STRL OR WHT (MISCELLANEOUS)
CONTAINER SPEC 2.5X3XGRAD LEK (MISCELLANEOUS) ×4 IMPLANT
CUTTER ECHEON FLEX ENDO 45 340 (ENDOMECHANICALS) IMPLANT
DEFOGGER SCOPE WARMER CLEARIFY (MISCELLANEOUS) ×1 IMPLANT
DRAIN CHANNEL 32F RND 10.7 FF (WOUND CARE) ×3 IMPLANT
DRAIN CHEST DRY SUCT SGL (MISCELLANEOUS) ×2 IMPLANT
DRAPE C-SECTION (MISCELLANEOUS) ×2 IMPLANT
DRAPE MAG INST 16X20 L/F (DRAPES) ×2 IMPLANT
DRAPE POUCH INSTRU U-SHP 10X18 (DRAPES) ×1 IMPLANT
DRSG OPSITE POSTOP 3X4 (GAUZE/BANDAGES/DRESSINGS) ×1 IMPLANT
DRSG OPSITE POSTOP 4X10 (GAUZE/BANDAGES/DRESSINGS) ×1 IMPLANT
DRSG OPSITE POSTOP 4X6 (GAUZE/BANDAGES/DRESSINGS) ×1 IMPLANT
DRSG OPSITE POSTOP 4X8 (GAUZE/BANDAGES/DRESSINGS) ×1 IMPLANT
DRSG TEGADERM 4X4.75 (GAUZE/BANDAGES/DRESSINGS) ×1 IMPLANT
DRSG TELFA 3X8 NADH (GAUZE/BANDAGES/DRESSINGS) ×2 IMPLANT
ELECT BLADE 6.5 EXT (BLADE) ×2 IMPLANT
ELECT CAUTERY BLADE TIP 2.5 (TIP) ×2
ELECT REM PT RETURN 9FT ADLT (ELECTROSURGICAL) ×2
ELECTRODE CAUTERY BLDE TIP 2.5 (TIP) ×1 IMPLANT
ELECTRODE REM PT RTRN 9FT ADLT (ELECTROSURGICAL) ×1 IMPLANT
GAUZE SPONGE 4X4 12PLY STRL (GAUZE/BANDAGES/DRESSINGS) ×2 IMPLANT
GLOVE SURG SYN 7.5  E (GLOVE) ×2
GLOVE SURG SYN 7.5 E (GLOVE) ×2 IMPLANT
GLOVE SURG SYN 7.5 PF PI (GLOVE) ×2 IMPLANT
GOWN STRL REUS W/ TWL LRG LVL3 (GOWN DISPOSABLE) ×3 IMPLANT
GOWN STRL REUS W/TWL LRG LVL3 (GOWN DISPOSABLE) ×10
KIT TURNOVER KIT A (KITS) ×2 IMPLANT
LABEL OR SOLS (LABEL) ×2 IMPLANT
LOOP RED MAXI  1X406MM (MISCELLANEOUS)
LOOP VESSEL MAXI 1X406 RED (MISCELLANEOUS) ×1 IMPLANT
MARKER SKIN DUAL TIP RULER LAB (MISCELLANEOUS) ×2 IMPLANT
PACK BASIN MAJOR ARMC (MISCELLANEOUS) ×2 IMPLANT
PAD DRESSING TELFA 3X8 NADH (GAUZE/BANDAGES/DRESSINGS) ×1 IMPLANT
RELOAD STAPLER LINE PROX 30 GR (STAPLE) IMPLANT
SPONGE KITTNER 5P (MISCELLANEOUS) ×9 IMPLANT
STAPLER RELOAD LINE PROX 30 GR (STAPLE)
STAPLER RELOADABLE 30 GRN THCK (STAPLE) ×1 IMPLANT
STAPLER SKIN PROX 35W (STAPLE) ×2 IMPLANT
STAPLER VASCULAR ECHELON 35 (CUTTER) IMPLANT
STRIP CLOSURE SKIN 1/2X4 (GAUZE/BANDAGES/DRESSINGS) ×3 IMPLANT
SUCTION FRAZIER HANDLE 10FR (MISCELLANEOUS) ×1
SUCTION TUBE FRAZIER 10FR DISP (MISCELLANEOUS) IMPLANT
SUT CHROMIC 3 0 SH 27 (SUTURE) ×1 IMPLANT
SUT MNCRL AB 3-0 PS2 27 (SUTURE) IMPLANT
SUT PROLENE 5 0 RB 1 DA (SUTURE) IMPLANT
SUT SILK 0 (SUTURE)
SUT SILK 0 30XBRD TIE 6 (SUTURE) ×1 IMPLANT
SUT SILK 1 SH (SUTURE) ×14 IMPLANT
SUT VIC AB 0 CT1 36 (SUTURE) ×4 IMPLANT
SUT VIC AB 2-0 CT1 27 (SUTURE) ×8
SUT VIC AB 2-0 CT1 TAPERPNT 27 (SUTURE) ×2 IMPLANT
SUT VICRYL 2 TP 1 (SUTURE) ×6 IMPLANT
SYR 10ML SLIP (SYRINGE) ×2 IMPLANT
SYR BULB IRRIG 60ML STRL (SYRINGE) ×2 IMPLANT
TAPE CLOTH 3X10 WHT NS LF (GAUZE/BANDAGES/DRESSINGS) ×2 IMPLANT
TAPE TRANSPORE STRL 2 31045 (GAUZE/BANDAGES/DRESSINGS) IMPLANT
TRAY FOLEY MTR SLVR 16FR STAT (SET/KITS/TRAYS/PACK) ×2 IMPLANT
TROCAR FLEXIPATH 20X80 (ENDOMECHANICALS) IMPLANT
TROCAR FLEXIPATH THORACIC 15MM (ENDOMECHANICALS) IMPLANT
TUBING CHEST DRAIN (MISCELLANEOUS) ×1 IMPLANT
TUBING CONNECTING 10 (TUBING) ×4 IMPLANT
WATER STERILE IRR 1000ML POUR (IV SOLUTION) ×2 IMPLANT
YANKAUER SUCT BULB TIP FLEX NO (MISCELLANEOUS) ×5 IMPLANT

## 2018-07-22 NOTE — Transfer of Care (Signed)
Immediate Anesthesia Transfer of Care Note  Patient: Jeff Leonard  Procedure(s) Performed: THORACOTOMY DECORTICATION (Right ) BEDSIDE BRONCHOSCOPY FIBEROPTIC (Right )  Patient Location: PACU  Anesthesia Type:General  Level of Consciousness: awake, alert  and oriented  Airway & Oxygen Therapy: Patient Spontanous Breathing and Patient connected to face mask oxygen  Post-op Assessment: Report given to RN and Post -op Vital signs reviewed and stable  Post vital signs: Reviewed and stable  Last Vitals:  Vitals Value Taken Time  BP 92/76 07/30/2018  1:40 PM  Temp 36.4 C 08/02/2018  1:40 PM  Pulse 98 08/02/2018  1:51 PM  Resp 20 08/05/2018  1:51 PM  SpO2 94 % 08/18/2018  1:51 PM  Vitals shown include unvalidated device data.  Last Pain:  Vitals:   08/17/2018 0526  TempSrc: Oral  PainSc:       Patients Stated Pain Goal: 0 (07/21/18 1010)  Complications: No apparent anesthesia complications

## 2018-07-22 NOTE — Progress Notes (Signed)
[facility] - [place] at [facility]   PATIENT NAME: Jeff Leonard    MR#:  [id number]  DATE OF BIRTH:  05/15/1951  SUBJECTIVE:  CHIEF COMPLAINT:   Chief Complaint  Patient presents with  . Pleural Effusion  Patient without complaint, no events overnight per nursing staff, for thoracotomy later today with pleurodesis  REVIEW OF SYSTEMS:  CONSTITUTIONAL: No fever, fatigue or weakness.  EYES: No blurred or double vision.  EARS, NOSE, AND THROAT: No tinnitus or ear pain.  RESPIRATORY: No cough, shortness of breath, wheezing or hemoptysis.  CARDIOVASCULAR: No chest pain, orthopnea, edema.  GASTROINTESTINAL: No nausea, vomiting, diarrhea or abdominal pain.  GENITOURINARY: No dysuria, hematuria.  ENDOCRINE: No polyuria, nocturia,  HEMATOLOGY: No anemia, easy bruising or bleeding SKIN: No rash or lesion. MUSCULOSKELETAL: No joint pain or arthritis.   NEUROLOGIC: No tingling, numbness, weakness.  PSYCHIATRY: No anxiety or depression.   ROS  DRUG ALLERGIES:  No Known Allergies  VITALS:  Blood pressure 129/79, pulse 88, temperature 98.6 F (37 C), temperature source Oral, resp. rate 20, height 6\' 1"  (1.854 m), weight 127.1 kg, SpO2 96 %.  PHYSICAL EXAMINATION:  GENERAL:  67 y.o.-year-old patient lying in the bed with no acute distress.  EYES: Pupils equal, round, reactive to light and accommodation. No scleral icterus. Extraocular muscles intact.  HEENT: Head atraumatic, normocephalic. Oropharynx and nasopharynx clear.  NECK:  Supple, no jugular venous distention. No thyroid enlargement, no tenderness.  LUNGS: Normal breath sounds bilaterally, no wheezing, rales,rhonchi or crepitation. No use of accessory muscles of respiration.  CARDIOVASCULAR: S1, S2 normal. No murmurs, rubs, or gallops.  ABDOMEN: Soft, nontender, nondistended. Bowel sounds present. No organomegaly or mass.  EXTREMITIES: No pedal edema, cyanosis, or clubbing.  NEUROLOGIC: Cranial nerves II through  XII are intact. Muscle strength 5/5 in all extremities. Sensation intact. Gait not checked.  PSYCHIATRIC: The patient is alert and oriented x 3.  SKIN: No obvious rash, lesion, or ulcer.   Physical Exam LABORATORY PANEL:   CBC Recent Labs  Lab July 29, 2018 0457  WBC 8.6  HGB 11.6*  HCT 33.7*  PLT 215   ------------------------------------------------------------------------------------------------------------------  Chemistries  Recent Labs  Lab 07/21/18 2118  NA 137  K 4.0  CL 105  CO2 26  GLUCOSE 120*  BUN 13  CREATININE 1.00  CALCIUM 8.9  AST 20  ALT 14  ALKPHOS 37*  BILITOT 0.5   ------------------------------------------------------------------------------------------------------------------  Cardiac Enzymes No results for input(s): TROPONINI in the last 168 hours. ------------------------------------------------------------------------------------------------------------------  RADIOLOGY:  No results found.  ASSESSMENT AND PLAN:  Patient 67 year old admitted with loculated pleural effusion on CT surgery service  *Right loculated pleural effusion Stable likely hemothorax S/p right chest tube placement on July 17, 2018 by Dr. Oakes/cardiothoracic surgery-plans for pleurodesis/decortication later today-for ICU placement after surgery  *History of recurrent pulmonary embolism and DVT Vascular surgery consulted - S/p IVC filter placement  *Hypertension  continue therapy with lisinopril  *Glaucoma  continue eyedrops  *DVT prophylaxis with SCDs.   All the records are reviewed and case discussed with Care Management/Social Workerr. Management plans discussed with the patient, family and they are in agreement.  CODE STATUS: full  TOTAL TIME TAKING CARE OF THIS PATIENT: 35 minutes.     POSSIBLE D/C IN 3-5 DAYS, DEPENDING ON CLINICAL CONDITION.   Evelena Asa Meriah Shands M.D on 08/19/2018   Between 7am to 6pm - Pager - [phone]  After 6pm go  to [email or web address] - password [email or web address]  [employer]  Office  [phone]  CC: Primary care physician; Sherrie Mustache, MD  Note: This dictation was prepared with Dragon dictation along with smaller phrase technology. Any transcriptional errors that result from this process are unintentional.

## 2018-07-22 NOTE — Consult Note (Signed)
Name: Jeff Leonard MRN: [id number] DOB: [birth year]     CONSULTATION DATE: 10.2.19 REFERRING MD : Thelma Barge  CHIEF COMPLAINT: RT sided hemothorax  STUDIES:     CXR independently reviewed by Me Extensive RT sided opacification    CT chest Independently reviewed by Me RT sided PTX/hydrothorax   HISTORY OF PRESENT ILLNESS: [name] admitted to ICU for post op resp care Patient with h/o PE on oral AC Developed RT sided hemothorax Patient underwent - Preoperative bronchoscopy to assess endobronchial anatomy; right thoracotomy with decortication of visceral and parietal pleura  Patient is alert and awake, following comnands NAD, no NVD Has 3 chest tubes in place  No resp distress     PAST MEDICAL HISTORY :   has a past medical history of Glaucoma, Hormone disorder, pulmonary embolus, Myocardial infarction (HCC), Past heart attack, and Sleep apnea.  has a past surgical history that includes Shoulder surgery and IVC FILTER INSERTION (N/A, 06/25/2018). Prior to Admission medications   Medication Sig Start Date End Date Taking? Authorizing Provider  albuterol (PROVENTIL HFA;VENTOLIN HFA) 108 (90 Base) MCG/ACT inhaler Inhale 2 puffs into the lungs every 6 (six) hours as needed for wheezing or shortness of breath. 04/02/17  Yes Wieting, Richard, MD  cetirizine (ZYRTEC) 10 MG tablet Take 10 mg by mouth daily. 07/08/18  Yes [provider]  clomiPHENE (CLOMID) 50 MG tablet One half tab daily Patient taking differently: Take 25 mg by mouth daily.  06/14/18  Yes Stoioff, Verna Czech, MD  cyclobenzaprine (FLEXERIL) 5 MG tablet Take 5 mg by mouth at bedtime as needed. 04/15/18  Yes [provider]  dorzolamide (TRUSOPT) 2 % ophthalmic solution Place 1 drop into both eyes daily.   Yes [provider]  latanoprost (XALATAN) 0.005 % ophthalmic solution Place 1 drop into both eyes at bedtime.  02/13/17  Yes [provider]  lisinopril (PRINIVIL,ZESTRIL) 5 MG tablet Take  5 mg by mouth daily.  05/11/18  Yes [provider]  timolol (TIMOPTIC) 0.5 % ophthalmic solution Place 1 drop into both eyes daily.  02/14/17  Yes [provider]  Vitamin D, Ergocalciferol, (DRISDOL) 50000 units CAPS capsule Take 50,000 Units by mouth every 7 (seven) days.  03/23/18  Yes [provider]  XARELTO 20 MG TABS tablet Take 20 mg by mouth every evening.  06/01/18  Yes [provider]   No Known Allergies  FAMILY HISTORY:  family history includes Asthma in his maternal grandfather; Cancer in his mother and paternal uncle; Heart disease (age of onset: 71) in his father; Hypertension in his mother. SOCIAL HISTORY:  reports that he has never smoked. He has never used smokeless tobacco. He reports that he does not drink alcohol or use drugs.  REVIEW OF SYSTEMS:   Constitutional: Negative for fever, chills, weight loss, malaise/fatigue and diaphoresis.  HENT: Negative for hearing loss, ear pain, nosebleeds, congestion, sore throat, neck pain, tinnitus and ear discharge.   Eyes: Negative for blurred vision, double vision, photophobia, pain, discharge and redness.  Respiratory: Negative for cough, hemoptysis, sputum production, shortness of breath, wheezing and stridor.   Cardiovascular: Negative for chest pain, palpitations, orthopnea, claudication, leg swelling and PND.  Gastrointestinal: Negative for heartburn, nausea, vomiting, abdominal pain, diarrhea, constipation, blood in stool and melena.  Genitourinary: Negative for dysuria, urgency, frequency, hematuria and flank pain.  Musculoskeletal: Negative for myalgias, back pain, joint pain and falls.  Skin: Negative for itching and rash.  Neurological: Negative for dizziness, tingling, tremors,  sensory change, speech change, focal weakness, seizures, loss of consciousness, weakness and headaches.  Endo/Heme/Allergies: Negative for environmental allergies and polydipsia. Does not bruise/bleed easily.  ALL  OTHER ROS ARE NEGATIVE    VITAL SIGNS: Temp:  [97.3 F (36.3 C)-98.6 F (37 C)] 97.3 F (36.3 C) (10/02 1500) Pulse Rate:  [77-101] 89 (10/02 1530) Resp:  [13-22] 21 (10/02 1530) BP: (92-138)/(76-85) 138/85 (10/02 1530) SpO2:  [92 %-98 %] 94 % (10/02 1530)  Physical Examination:   GENERAL:NAD, no fevers, chills, no weakness no fatigue HEAD: Normocephalic, atraumatic.  EYES: Pupils equal, round, reactive to light. Extraocular muscles intact. No scleral icterus.  MOUTH: Moist mucosal membrane.   EAR, NOSE, THROAT: Clear without exudates. No external lesions.  NECK: Supple. No thyromegaly. No nodules. No JVD.  PULMONARY:CTA B/L no wheezes, no crackles, no rhonchi 3 chest tubes in place CARDIOVASCULAR: S1 and S2. Regular rate and rhythm. No murmurs, rubs, or gallops. No edema.  GASTROINTESTINAL: Soft, nontender, nondistended. No masses. Positive bowel sounds.  MUSCULOSKELETAL: No swelling, clubbing, or edema. Range of motion full in all extremities.  NEUROLOGIC: Cranial nerves II through XII are intact. No gross focal neurological deficits.  SKIN: No ulceration, lesions, rashes, or cyanosis. Skin warm and dry. Turgor intact.  PSYCHIATRIC: Mood, affect within normal limits. The patient is awake, alert and oriented x 3. Insight, judgment intact.      ASSESSMENT / PLAN: 67 yo AAM with RT sided opacification from RT sided Hemothorax s/p decortication  1.incentive spirometry  2.oxygen as needed 3.follow up CT surgery recs 4.pain control 5.s/p IVC filter  Continue Supportive care   Temisha Murley Santiago Glad, M.D.  [facility]  Medical Director [facility] Medical Director [facility] Cardio-Pulmonary Department

## 2018-07-22 NOTE — Progress Notes (Signed)
Patient ID: Jeff Leonard, male   DOB: 24-Oct-1950, 67 y.o.   MRN: [id number] Reviewed cyto slides with Dr. Oneita Kras.  Old blood and debris on slide.  No evidence of carcinoma.  The cell block will be out later today or tomorrow.  Reviewed this with patient.  Offered to wait but he would like to proceed with planned surgery.  [employer].

## 2018-07-22 NOTE — Care Management Important Message (Signed)
Copy of signed IM left in patient's room (out for procedure). 

## 2018-07-22 NOTE — Anesthesia Post-op Follow-up Note (Signed)
Anesthesia QCDR form completed.        

## 2018-07-22 NOTE — Anesthesia Postprocedure Evaluation (Signed)
Anesthesia Post Note  Patient: Jeff Leonard  Procedure(s) Performed: THORACOTOMY DECORTICATION (Right ) BEDSIDE BRONCHOSCOPY FIBEROPTIC (Right )  Patient location during evaluation: PACU Anesthesia Type: General Level of consciousness: awake and alert Pain management: pain level controlled Vital Signs Assessment: post-procedure vital signs reviewed and stable Respiratory status: spontaneous breathing, nonlabored ventilation, respiratory function stable and patient connected to nasal cannula oxygen Cardiovascular status: blood pressure returned to baseline and stable Postop Assessment: no apparent nausea or vomiting Anesthetic complications: no     Last Vitals:  Vitals:   07/28/2018 1500 08/10/2018 1530  BP:  138/85  Pulse: 89 89  Resp: (!) 22 (!) 21  Temp: (!) 36.3 C   SpO2: 95% 94%    Last Pain:  Vitals:   08/07/2018 1500  TempSrc:   PainSc: 9                  Damian Hofstra S

## 2018-07-22 NOTE — Op Note (Signed)
  08/08/2018  1:56 PM  PATIENT:  Jeff Leonard  67 y.o. male  PRE-OPERATIVE DIAGNOSIS: Chronic right hemopneumothorax  POST-OPERATIVE DIAGNOSIS: Chronic right hemothorax  PROCEDURE: Preoperative bronchoscopy to assess endobronchial anatomy; right thoracotomy with decortication of visceral and parietal pleura  SURGEON:  Surgeon(s) and Role:    [name], MD - Primary    * Ancil Linsey, MD - Assisting  ASSISTANTS: Molli Posey PAS Thedore Mins PAS  ANESTHESIA: General endotracheal  INDICATIONS FOR PROCEDURE this patient is a 67 year old gentleman with a previous history of pulmonary emboli who is been treated with oral Xarelto for several years.  He developed increasing shortness of breath and was evaluated extensively with multiple x-rays CT scans and tubes.  He was ultimately found to have chronic right hemothorax.  He was evaluated and found to be a suitable candidate for surgery.  The indications and risks were explained the patient gave his informed consent.  DICTATION: Patient is brought to the operating suite placed in the supine position.  General endotracheal anesthesia was given through a double-lumen tube.  Preoperative bronchoscopy was carried out was normal to the subsegmental levels bilaterally.  There is no evidence of tumor or purulent secretions.  Patient was then turned for a right thoracotomy after all pressure points were carefully padded.  The patient was prepped and draped in usual sterile fashion.  The posterior lateral fifth interspace thoracotomy was performed.  The serratus muscle was spared.  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  A complete decortication was then carried out freeing up the entire lung.  The only portion that could not be freed was that near the inferior pulmonary ligament.  There was no evidence of tumor or infection.  The only free portion of the lung that was not entrapped was  along the mediastinum.  Once the entire lung was freed up including the fissures the chest was drained with 3 - 32 [place] Blake tubes.  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  Hemostasis was complete.  The tubes were secured with #1 silk.  The chest was then closed in standard fashion with #2 Vicryl pericostal sutures.  The latissimus muscle was closed with #2 Vicryl.  The subcutaneous tissues with 2-0 Vicryl and the skin with skin clips.  Sterile dressings were then applied.  Patient was then extubated and taken to the recovery room in stable condition.   [name], MD

## 2018-07-22 NOTE — Progress Notes (Signed)
Anesthesia H&P Update: History and Physical Exam reviewed; patient is OK for planned anesthetic and procedure. Patient ID: Jeff Leonard, male   DOB: 09-24-1951, 67 y.o.   MRN: [id number]

## 2018-07-22 NOTE — Anesthesia Procedure Notes (Addendum)
Procedure Name: Intubation Date/Time: Aug 15, 2018 8:15 AM Performed by: Henrietta Hoover, CRNA Pre-anesthesia Checklist: Patient identified, Patient being monitored, Timeout performed, Emergency Drugs available and Suction available Patient Re-evaluated:Patient Re-evaluated prior to induction Oxygen Delivery Method: Circle system utilized Preoxygenation: Pre-oxygenation with 100% oxygen Induction Type: IV induction Ventilation: Mask ventilation without difficulty Laryngoscope Size: McGraph and 3 Grade View: Grade I Endobronchial tube: Left and Double lumen EBT and 39 Fr Number of attempts: 1 Airway Equipment and Method: Stylet Placement Confirmation: ETT inserted through vocal cords under direct vision,  positive ETCO2 and breath sounds checked- equal and bilateral Secured at: 29 cm Dental Injury: Teeth and Oropharynx as per pre-operative assessment

## 2018-07-22 NOTE — Progress Notes (Signed)
   08/13/2018 0700  Clinical Encounter Type  Visited With Patient and family together  Visit Type Initial;Spiritual support  Recommendations Follow-up as needed.  Spiritual Encounters  Spiritual Needs Prayer;Emotional  Stress Factors  Patient Stress Factors Health changes   Chaplain offered prayer, emotional support, and pastoral presence to help calm and assure the patient. Patient was receptive and joined in the prayer.

## 2018-07-23 ENCOUNTER — Inpatient Hospital Stay: Payer: Medicare PPO

## 2018-07-23 ENCOUNTER — Encounter: Payer: Self-pay | Admitting: Cardiothoracic Surgery

## 2018-07-23 LAB — BPAM RBC
BLOOD PRODUCT EXPIRATION DATE: 201910202359
Blood Product Expiration Date: 201910202359
UNIT TYPE AND RH: 6200
Unit Type and Rh: 6200

## 2018-07-23 LAB — TYPE AND SCREEN
ABO/RH(D): A POS
Antibody Screen: NEGATIVE
UNIT DIVISION: 0
Unit division: 0

## 2018-07-23 LAB — CBC
HEMATOCRIT: 33.9 % — AB (ref 40.0–52.0)
Hemoglobin: 11.4 g/dL — ABNORMAL LOW (ref 13.0–18.0)
MCH: 31.6 pg (ref 26.0–34.0)
MCHC: 33.7 g/dL (ref 32.0–36.0)
MCV: 93.7 fL (ref 80.0–100.0)
Platelets: 226 10*3/uL (ref 150–440)
RBC: 3.62 MIL/uL — ABNORMAL LOW (ref 4.40–5.90)
RDW: 13.6 % (ref 11.5–14.5)
WBC: 14.3 10*3/uL — AB (ref 3.8–10.6)

## 2018-07-23 LAB — BASIC METABOLIC PANEL
ANION GAP: 6 (ref 5–15)
BUN: 11 mg/dL (ref 8–23)
CALCIUM: 8.6 mg/dL — AB (ref 8.9–10.3)
CO2: 26 mmol/L (ref 22–32)
Chloride: 105 mmol/L (ref 98–111)
Creatinine, Ser: 0.98 mg/dL (ref 0.61–1.24)
GFR calc Af Amer: >60 mL/min (ref >60)
GLUCOSE: 154 mg/dL — AB (ref 70–99)
POTASSIUM: 5 mmol/L (ref 3.5–5.1)
Sodium: 137 mmol/L (ref 135–145)

## 2018-07-23 LAB — ACID FAST SMEAR (AFB, MYCOBACTERIA): Acid Fast Smear: NEGATIVE

## 2018-07-23 LAB — ACID FAST SMEAR (AFB)

## 2018-07-23 LAB — AEROBIC/ANAEROBIC CULTURE W GRAM STAIN (SURGICAL/DEEP WOUND)
Culture: NO GROWTH
Special Requests: NORMAL

## 2018-07-23 LAB — PREPARE RBC (CROSSMATCH)

## 2018-07-23 MED ORDER — MORPHINE SULFATE (PF) 2 MG/ML IV SOLN
1.0000 mg | INTRAVENOUS | Status: DC | PRN
  Administered 2018-07-23 – 2018-07-27 (×7): 2 mg via INTRAVENOUS
  Filled 2018-07-23 (×7): qty 1

## 2018-07-23 MED ORDER — SODIUM CHLORIDE 0.9% FLUSH
10.0000 mL | Freq: Two times a day (BID) | INTRAVENOUS | Status: DC
  Administered 2018-07-23: 10 mL
  Administered 2018-07-24: 30 mL
  Administered 2018-07-24: 10 mL
  Administered 2018-07-25: 20 mL
  Administered 2018-07-25 – 2018-07-28 (×7): 10 mL

## 2018-07-23 MED ORDER — SODIUM CHLORIDE 0.9% FLUSH: 10.0000 mL | INTRAVENOUS | Status: DC | PRN

## 2018-07-23 NOTE — Progress Notes (Addendum)
SURGICAL PROGRESS NOTE  Hospital Day(s): 7.   Post op day(s): 1 Day Post-Op.   Interval History: Patient seen and examined, no acute events or new complaints overnight. Continues to note soreness in his chest but denied any fevers, chills, SOB, abdominal pain, nausea, or emesis. He is tolerating a diet. Has not been mobilizing.   Review of Systems:  Constitutional: denies fever, chills  HEENT: denies cough or congestion  Respiratory: denies any shortness of breath  Cardiovascular: + chest pain, denied palpitations  Gastrointestinal: denies abdominal pain, N/V, or diarrhea/and bowel function as per interval history Genitourinary: denies burning with urination or urinary frequency Musculoskeletal: denies pain, decreased motor or sensation Integumentary: denies any other rashes or skin discolorations except Neurological: denies HA or vision/hearing changes   Vital signs in last 24 hours: [min-max] current  Temp:  [97.3 F (36.3 C)-98.5 F (36.9 C)] 98.2 F (36.8 C) (10/03 0800) Pulse Rate:  [59-101] 78 (10/03 0800) Resp:  [13-30] 25 (10/03 0800) BP: (92-148)/(68-88) 135/82 (10/03 0800) SpO2:  [92 %-99 %] 98 % (10/03 0800) Weight:  [130.8 kg] 130.8 kg (10/02 1700)     Height: 6\' 1"  (185.4 cm) Weight: 130.8 kg BMI (Calculated): 38.05   Intake/Output this shift:  Total I/O In: 150.1 [I.V.:150.1] Out: -    Intake/Output last 2 shifts:  @IOLAST2SHIFTS @   Physical Exam:  Constitutional: alert, cooperative and no distress  HENT: normocephalic without obvious abnormality  Eyes: PERRL, EOM's grossly intact and symmetric  Neuro: CN II - XII grossly intact and symmetric without deficit  Respiratory: breathing non-labored at rest, no adventitious breath sounds Cardiovascular: regular rate and sinus rhythm  Chest: 3 chest tube to the right lateral chest wall, dressings in place, no air leak.  Musculoskeletal: no edema or wounds, motor and sensation grossly intact, NT    Labs:   CBC Latest Ref Rng & Units 07/23/2018 08/03/2018 08/17/2018  WBC 3.8 - 10.6 K/uL 14.3(H) 13.9(H) 8.6  Hemoglobin 13.0 - 18.0 g/dL 11.4(L) 11.9(L) 11.6(L)  Hematocrit 40.0 - 52.0 % 33.9(L) 35.0(L) 33.7(L)  Platelets 150 - 440 K/uL 226 209 215   CMP Latest Ref Rng & Units 07/23/2018 08/04/2018 07/21/2018  Glucose 70 - 99 mg/dL 161(W) 960(A) 540(J)  BUN 8 - 23 mg/dL 11 14 13   Creatinine 0.61 - 1.24 mg/dL 8.11 9.14 7.82  Sodium 135 - 145 mmol/L 137 135 137  Potassium 3.5 - 5.1 mmol/L 5.0 4.1 4.0  Chloride 98 - 111 mmol/L 105 104 105  CO2 22 - 32 mmol/L 26 24 26   Calcium 8.9 - 10.3 mg/dL 9.5(A) 2.1(H) 8.9  Total Protein 6.5 - 8.1 g/dL - - 7.2  Total Bilirubin 0.3 - 1.2 mg/dL - - 0.5  Alkaline Phos 38 - 126 U/L - - 37(L)  AST 15 - 41 U/L - - 20  ALT 0 - 44 U/L - - 14     Imaging studies:  CXR 10/03:   IMPRESSION: No change from the prior study. Multiple right chest tubes with mild right lower lobe atelectasis. No pneumothorax.   Assessment/Plan: (ICD-10's: J94.2) 67 y.o. male with right hemothorax 1 Day Post-Op s/p thoracotomy and decortication for right hemothorax, complicated by pertinent comorbidities including history of PE, MI, sleep apnea, and anticoagulation need.   - He is doing well POD1   - Chest tubes to remain on suction for 1 more day, continue daily CXR and monitor chest tube output. Likely can go to water seal Saturday and chest tubes can be removed  sequentially based on output.    - Leukosytosis unchanged, likely stress reaction from surgery, continue to trend  - Continue pain control  - Encouraged aggressive pulmonary toilet, IS use  - Mobilize  - Transfer to floor this afternoon, appreciate CCM help  - Medicine primary, appreciate their help    All of the above findings and recommendations were discussed with the patient, and the medical team, and all of patient'squestions were answered to his expressed satisfaction.  Thank you for the opportunity to participate  in this patient's care.  -- Lynden Oxford, PA-C [facility] 07/23/2018, 9:02 AM [phone] M-F: 7am - 4pm

## 2018-07-23 NOTE — Progress Notes (Signed)
[facility]   PATIENT NAME: Jeff Leonard    MR#:  [id number]  DATE OF BIRTH:  1951/09/08  SUBJECTIVE:  patient got transferred out of ICU. Has chest tubes placed in for right-sided lock alerted pleural effusion/pneumothorax. Patient doing okay. No respiratory distress. Family in the room.  REVIEW OF SYSTEMS:   Review of Systems  Constitutional: Negative for chills, fever and weight loss.  HENT: Negative for ear discharge, ear pain and nosebleeds.   Eyes: Negative for blurred vision, pain and discharge.  Respiratory: Negative for sputum production, shortness of breath, wheezing and stridor.   Cardiovascular: Positive for chest pain. Negative for palpitations, orthopnea and PND.  Gastrointestinal: Negative for abdominal pain, diarrhea, nausea and vomiting.  Genitourinary: Negative for frequency and urgency.  Musculoskeletal: Negative for back pain and joint pain.  Neurological: Negative for sensory change, speech change, focal weakness and weakness.  Psychiatric/Behavioral: Negative for depression and hallucinations. The patient is not nervous/anxious.    Tolerating Diet:yesTolerating PT: ambulatory  DRUG ALLERGIES:  No Known Allergies  VITALS:  Blood pressure 129/89, pulse 77, temperature 98.2 F (36.8 C), temperature source Oral, resp. rate 20, height 6\' 1"  (1.854 m), weight 130.8 kg, SpO2 100 %.  PHYSICAL EXAMINATION:   Physical Exam  GENERAL:  67 y.o.-year-old patient lying in the bed with no acute distress. obese EYES: Pupils equal, round, reactive to light and accommodation. No scleral icterus. Extraocular muscles intact.  HEENT: Head atraumatic, normocephalic. Oropharynx and nasopharynx clear.  NECK:  Supple, no jugular venous distention. No thyroid enlargement, no tenderness.  LUNGS: Normal breath sounds bilaterally, no wheezing, rales, rhonchi. No use of accessory muscles of respiration. Right sided CT + CARDIOVASCULAR:  S1, S2 normal. No murmurs, rubs, or gallops.  ABDOMEN: Soft, nontender, nondistended. Bowel sounds present. No organomegaly or mass.  EXTREMITIES: No cyanosis, clubbing or edema b/l.    NEUROLOGIC: Cranial nerves II through XII are intact. No focal Motor or sensory deficits b/l.   PSYCHIATRIC:  patient is alert and oriented x 3.  SKIN: No obvious rash, lesion, or ulcer.   LABORATORY PANEL:  CBC Recent Labs  Lab 07/23/18 0325  WBC 14.3*  HGB 11.4*  HCT 33.9*  PLT 226    Chemistries  Recent Labs  Lab 07/21/18 2118  07/23/18 0325  NA 137   < > 137  K 4.0   < > 5.0  CL 105   < > 105  CO2 26   < > 26  GLUCOSE 120*   < > 154*  BUN 13   < > 11  CREATININE 1.00   < > 0.98  CALCIUM 8.9   < > 8.6*  AST 20  --   --   ALT 14  --   --   ALKPHOS 37*  --   --   BILITOT 0.5  --   --    < > = values in this interval not displayed.   Cardiac Enzymes No results for input(s): TROPONINI in the last 168 hours. RADIOLOGY:  Dg Chest Port 1 View  Result Date: 07/23/2018 CLINICAL DATA:  Chest tube EXAM: PORTABLE CHEST 1 VIEW COMPARISON:  08/06/2018 FINDINGS: Multiple chest tubes on the right remain in place. Diffuse right pleural thickening/fluid unchanged. No pneumothorax. Mild right lower lobe atelectasis unchanged Right jugular central venous catheter tip extends into the upper right atrium unchanged. Left lung clear. Negative for edema. IMPRESSION: No change from the prior study. Multiple right chest  tubes with mild right lower lobe atelectasis. No pneumothorax. Electronically Signed   By: Marlan Palau M.D.   On: 07/23/2018 07:14   Dg Chest Port 1 View  Result Date: 2018/08/21 CLINICAL DATA:  Postop day 0 decortication of the RIGHT pleural space in this patient who has a chronic RIGHT hemothorax. EXAM: PORTABLE CHEST 1 VIEW COMPARISON:  CT chest 07/19/2018, 06/30/2018. Chest x-rays 07/19/2018 and earlier. FINDINGS: Resolution of the air in the LEFT pleural space post thoracotomy and  decortication. Residual moderate pleural thickening and/or loculated pleural effusion/hemothorax. Three RIGHT chest tubes in place. Minimal subcutaneous emphysema in the RIGHT chest wall and RIGHT low neck. Improved aeration in the RIGHT lung, with residual passive atelectasis. LEFT lung remains clear. RIGHT jugular central venous catheter tip projects at or near the cavoatrial junction, unchanged. IMPRESSION: 1. Three RIGHT chest tubes in place post thoracotomy and decortication of the RIGHT pleural space. Residual loculated RIGHT pleural effusion/hemothorax, but no evidence of residual pleural air. 2. Improved aeration in the RIGHT lung, with moderate passive atelectasis persisting. 3. LEFT lung remains clear. 4. Minimal subcutaneous emphysema in the RIGHT chest wall and RIGHT low neck. Electronically Signed   By: Hulan Saas M.D.   On: 2018-08-21 14:17   ASSESSMENT AND PLAN:   67 year old admitted with loculated pleural effusion on CT surgery service  *Right loculated pleural effusion-chronic -S/p right chest tube placement on July 17, 2018 by Dr. Oakes/cardiothoracic surgery- -s/p pleurodesis/decortication on 2018-08-21  *History of recurrent pulmonary embolism and DVT Vascular surgery consulted - S/p IVC filter placement -pt currently off xarelto. Had been on it for 4 years. Will check US doppler LE. If negative then maybe consider not restarting anticoagulation -will d/w PCP dr Dario Guardian  *Hypertension  continue therapy with lisinopril  *Glaucoma  continue eyedrops  *DVT prophylaxis with SCDs.  Case discussed with Care Management/Social Worker. Management plans discussed with the patient, family and they are in agreement.  CODE STATUS: full  DVT Prophylaxis: SCD  TOTAL TIME TAKING CARE OF THIS PATIENT: *25* minutes.  >50% time spent on counselling and coordination of care  POSSIBLE D/C IN *1-2* DAYS, DEPENDING ON CLINICAL CONDITION.  Note: This dictation was  prepared with Dragon dictation along with smaller phrase technology. Any transcriptional errors that result from this process are unintentional.  Enedina Finner M.D on 07/23/2018 at 2:48 PM  Between 7am to 6pm - Pager - [phone]  After 6pm go to [email or web address] - Social research officer, government  [facility]  Office  [phone]  CC: Primary care physician; Sherrie Mustache, MDPatient ID: [name], male   DOB: [birth year], 67 y.o.   MRN: [id number]

## 2018-07-23 NOTE — Progress Notes (Signed)
Telephone report called to [place] on 2C.  Patient moved with nurse aid and RN via chair to room 219.

## 2018-07-23 NOTE — Progress Notes (Signed)
[facility]  Daily Progress Note    IVC filter placed on 07/08/2018 by Dr. Gilda Crease. Vascular Surgery will sign off at this time.  Please reconsult if necessary.   Cleda Daub PA-C 07/23/2018 12:57 PM

## 2018-07-23 NOTE — Progress Notes (Signed)
   CHIEF COMPLAINT:   Follow up hemothroax   Subjective  No SOB Alert and awake NAD 3 tubes in place     Objective   VITALS: BP 110/68   Pulse (!) 59   Temp 98.5 F (36.9 C) (Axillary)   Resp 16   Ht 6\' 1"  (1.854 m)   Wt 130.8 kg   SpO2 99%   BMI 38.04 kg/m    Review of Systems:  Gen:  Denies  fever, sweats, chills weigh loss  HEENT: Denies blurred vision, double vision, ear pain, eye pain, hearing loss, nose bleeds, sore throat Cardiac:  No dizziness, chest pain or heaviness, chest tightness,edema, No JVD Resp:   No SOB Other:  All other systems negative  Physical Examination:   GENERAL:NAD, no fevers, chills, no weakness no fatigue HEAD: Normocephalic, atraumatic.  EYES: Pupils equal, round, reactive to light. Extraocular muscles intact. No scleral icterus.  MOUTH: Moist mucosal membrane. Dentition intact. No abscess noted.  EAR, NOSE, THROAT: Clear without exudates. No external lesions.  NECK: Supple. No thyromegaly. No nodules. No JVD.  PULMONARY: CTA B/L no wheezing, rhonchi, crackles CARDIOVASCULAR: S1 and S2. Regular rate and rhythm. No murmurs, rubs, or gallops. No edema. Pedal pulses 2+ bilaterally.  GASTROINTESTINAL: Soft, nontender, nondistended. No masses. Positive bowel sounds. No hepatosplenomegaly.  MUSCULOSKELETAL: No swelling, clubbing, or edema. Range of motion full in all extremities.  NEUROLOGIC: Cranial nerves II through XII are intact. No gross focal neurological deficits. Sensation intact. Reflexes intact.  SKIN: No ulceration, lesions, rashes, or cyanosis. Skin warm and dry. Turgor intact.  PSYCHIATRIC: Mood, affect within normal limits. The patient is awake, alert and oriented x 3. Insight, judgment intact.  ALL OTHER ROS ARE NEGATIVE   I personally reviewed Labs under Results section.    Assessment/Plan:   S/p decortication from hemothorax Doing well post op 3 chest tubes in place Follow up Dr Thelma Barge recs   Transfer to gen med  floor  Sofija Antwi Santiago Glad, M.D.  [facility]  Medical Director [facility] Medical Director [facility] Cardio-Pulmonary Department

## 2018-07-24 ENCOUNTER — Inpatient Hospital Stay: Payer: Medicare PPO

## 2018-07-24 LAB — CBC
HCT: 32.1 % — ABNORMAL LOW (ref 40.0–52.0)
HEMOGLOBIN: 10.9 g/dL — AB (ref 13.0–18.0)
MCH: 31.9 pg (ref 26.0–34.0)
MCHC: 34 g/dL (ref 32.0–36.0)
MCV: 93.9 fL (ref 80.0–100.0)
PLATELETS: 204 10*3/uL (ref 150–440)
RBC: 3.41 MIL/uL — AB (ref 4.40–5.90)
RDW: 13.9 % (ref 11.5–14.5)
WBC: 14 10*3/uL — ABNORMAL HIGH (ref 3.8–10.6)

## 2018-07-24 LAB — SURGICAL PATHOLOGY

## 2018-07-24 NOTE — Care Management Important Message (Signed)
Copy of signed IM left with patient in room.  

## 2018-07-24 NOTE — Care Management (Addendum)
Chest tube remains to suction.  Patient up to the chair.  Requested for nursing to ambulate, and enter PT if appropriate.  Patient currently requiring acute O2 at 3 liters.

## 2018-07-24 NOTE — Progress Notes (Signed)
[facility] -  at [facility]   PATIENT NAME: Jeff Leonard    MR#:  [id number]  DATE OF BIRTH:  1951/06/10  SUBJECTIVE:   Has chest tubes placed in for right-sided lock alerted pleural effusion/pneumothorax. Patient doing okay. No respiratory distress.   REVIEW OF SYSTEMS:   Review of Systems  Constitutional: Negative for chills, fever and weight loss.  HENT: Negative for ear discharge, ear pain and nosebleeds.   Eyes: Negative for blurred vision, pain and discharge.  Respiratory: Negative for sputum production, shortness of breath, wheezing and stridor.   Cardiovascular: Positive for chest pain. Negative for palpitations, orthopnea and PND.  Gastrointestinal: Negative for abdominal pain, diarrhea, nausea and vomiting.  Genitourinary: Negative for frequency and urgency.  Musculoskeletal: Negative for back pain and joint pain.  Neurological: Negative for sensory change, speech change, focal weakness and weakness.  Psychiatric/Behavioral: Negative for depression and hallucinations. The patient is not nervous/anxious.    Tolerating Diet:yesTolerating PT: ambulatory  DRUG ALLERGIES:  No Known Allergies  VITALS:  Blood pressure 108/74, pulse 90, temperature 98.1 F (36.7 C), temperature source Oral, resp. rate 18, height 6\' 1"  (1.854 m), weight 130.8 kg, SpO2 96 %.  PHYSICAL EXAMINATION:   Physical Exam  GENERAL:  67 y.o.-year-old patient lying in the bed with no acute distress. obese EYES: Pupils equal, round, reactive to light and accommodation. No scleral icterus. Extraocular muscles intact.  HEENT: Head atraumatic, normocephalic. Oropharynx and nasopharynx clear.  NECK:  Supple, no jugular venous distention. No thyroid enlargement, no tenderness.  LUNGS: Normal breath sounds bilaterally, no wheezing, rales, rhonchi. No use of accessory muscles of respiration.  3Right sided CT + CARDIOVASCULAR: S1, S2 normal. No murmurs, rubs, or gallops.   ABDOMEN: Soft, nontender, nondistended. Bowel sounds present. No organomegaly or mass.  EXTREMITIES: No cyanosis, clubbing or edema b/l.    NEUROLOGIC: Cranial nerves II through XII are intact. No focal Motor or sensory deficits b/l.   PSYCHIATRIC:  patient is alert and oriented x 3.  SKIN: No obvious rash, lesion, or ulcer.   LABORATORY PANEL:  CBC Recent Labs  Lab 07/24/18 0403  WBC 14.0*  HGB 10.9*  HCT 32.1*  PLT 204    Chemistries  Recent Labs  Lab 07/21/18 2118  07/23/18 0325  NA 137   < > 137  K 4.0   < > 5.0  CL 105   < > 105  CO2 26   < > 26  GLUCOSE 120*   < > 154*  BUN 13   < > 11  CREATININE 1.00   < > 0.98  CALCIUM 8.9   < > 8.6*  AST 20  --   --   ALT 14  --   --   ALKPHOS 37*  --   --   BILITOT 0.5  --   --    < > = values in this interval not displayed.   Cardiac Enzymes No results for input(s): TROPONINI in the last 168 hours. RADIOLOGY:  US Venous Img Lower Bilateral  Result Date: 07/23/2018 CLINICAL DATA:  History of DVT and pulmonary embolism. Evaluate for acute or chronic DVT. EXAM: BILATERAL LOWER EXTREMITY VENOUS DOPPLER ULTRASOUND TECHNIQUE: Gray-scale sonography with graded compression, as well as color Doppler and duplex ultrasound were performed to evaluate the lower extremity deep venous systems from the level of the common femoral vein and including the common femoral, femoral, profunda femoral, popliteal and calf veins including the posterior tibial, peroneal and  gastrocnemius veins when visible. The superficial great saphenous vein was also interrogated. Spectral Doppler was utilized to evaluate flow at rest and with distal augmentation maneuvers in the common femoral, femoral and popliteal veins. COMPARISON:  Bilateral lower extremity venous Doppler ultrasound-03/25/2017 FINDINGS: Examination is degraded due to patient body habitus and poor sonographic window. RIGHT LOWER EXTREMITY Common Femoral Vein: No evidence of thrombus. Normal  compressibility, respiratory phasicity and response to augmentation. Saphenofemoral Junction: No evidence of thrombus. Normal compressibility and flow on color Doppler imaging. Profunda Femoral Vein: No evidence of thrombus. Normal compressibility and flow on color Doppler imaging. Femoral Vein: No evidence of thrombus. Normal compressibility, respiratory phasicity and response to augmentation. Popliteal Vein: No evidence of thrombus. Normal compressibility, respiratory phasicity and response to augmentation. Calf Veins: No evidence of thrombus. Normal compressibility and flow on color Doppler imaging. Superficial Great Saphenous Vein: No evidence of thrombus. Normal compressibility. Venous Reflux:  None. Other Findings:  None. LEFT LOWER EXTREMITY Common Femoral Vein: No evidence of thrombus. Normal compressibility, respiratory phasicity and response to augmentation. Saphenofemoral Junction: No evidence of thrombus. Normal compressibility and flow on color Doppler imaging. Profunda Femoral Vein: No evidence of thrombus. Normal compressibility and flow on color Doppler imaging. Femoral Vein: No evidence of thrombus. Normal compressibility, respiratory phasicity and response to augmentation. Popliteal Vein: No evidence of thrombus. Normal compressibility, respiratory phasicity and response to augmentation. Calf Veins: No evidence of acute or chronic thrombus. Normal compressibility and flow on color Doppler imaging. Superficial Great Saphenous Vein: No evidence of thrombus. Normal compressibility. Venous Reflux:  None. Other Findings:  None. IMPRESSION: No evidence of acute or chronic DVT within either lower extremity with special attention paid to the left tibial veins. Electronically Signed   By: Simonne Come M.D.   On: 07/23/2018 16:57   Dg Chest Port 1 View  Result Date: 07/24/2018 CLINICAL DATA:  Chest tube in place. EXAM: PORTABLE CHEST 1 VIEW COMPARISON:  None. FINDINGS: Cardiomediastinal silhouette is  unchanged in position. 3 right-sided chest tubes are noted. No definite pneumothorax is noted. Stable loculated right pleural effusion is noted. Stable bibasilar subsegmental atelectasis is noted. Minimal left pleural effusion is noted. Stable position of right internal jugular catheter with distal tip in right atrium. Bony thorax is unremarkable. IMPRESSION: Stable position of 3 right-sided chest tubes without pneumothorax. Stable loculated right pleural effusion. Stable bibasilar subsegmental atelectasis with minimal left pleural effusion. Stable right internal jugular catheter is noted with tip in right atrium. Electronically Signed   By: Lupita Raider, M.D.   On: 07/24/2018 07:20   Dg Chest Port 1 View  Result Date: 07/23/2018 CLINICAL DATA:  Chest tube EXAM: PORTABLE CHEST 1 VIEW COMPARISON:  07/21/2018 FINDINGS: Multiple chest tubes on the right remain in place. Diffuse right pleural thickening/fluid unchanged. No pneumothorax. Mild right lower lobe atelectasis unchanged Right jugular central venous catheter tip extends into the upper right atrium unchanged. Left lung clear. Negative for edema. IMPRESSION: No change from the prior study. Multiple right chest tubes with mild right lower lobe atelectasis. No pneumothorax. Electronically Signed   By: Marlan Palau M.D.   On: 07/23/2018 07:14   ASSESSMENT AND PLAN:   67 year old admitted with loculated pleural effusion on CT surgery service  *Right loculated pleural effusion-chronic -S/p right chest tube placement on July 17, 2018 by Dr. Oakes/cardiothoracic surgery- -s/p pleurodesis/decortication on oct 2nd 2019 -chest tube management per surgery  *History of recurrent pulmonary embolism and DVT Vascular surgery consulted - S/p IVC filter  placement -pt currently off xarelto. Had been on it for 4 years.  -Ultrasound bilateral LE negative. If negative then maybe consider not restarting anticoagulation -will d/w PCP dr Dario Guardian on  monday  *Hypertension  continue therapy with lisinopril  *Glaucoma  continue eyedrops  *DVT prophylaxis with SCDs.  Case discussed with Care Management/Social Worker. Management plans discussed with the patient, family and they are in agreement.  CODE STATUS: full  DVT Prophylaxis: SCD  TOTAL TIME TAKING CARE OF THIS PATIENT: *25* minutes.  >50% time spent on counselling and coordination of care  POSSIBLE D/C IN *1-2* DAYS, DEPENDING ON CLINICAL CONDITION.  Note: This dictation was prepared with Dragon dictation along with smaller phrase technology. Any transcriptional errors that result from this process are unintentional.  Enedina Finner M.D on 07/24/2018 at 3:13 PM  Between 7am to 6pm - Pager - [phone]  After 6pm go to [email or web address] - Social research officer, government  [facility]  Office  [phone]  CC: Primary care physician; Sherrie Mustache, MDPatient ID: Jeff Leonard, male   DOB: 09-16-1951, 67 y.o.   MRN: [id number]

## 2018-07-24 NOTE — Progress Notes (Addendum)
SURGICAL PROGRESS NOTE  Patient seen and examined as described below with surgical PA-C, Gillermina Phy.  Assessment/Plan: 67 y.o. 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   - pain control as needed (minimize narcotics)  - decreased suction to 15 mmHg due to air leak, stable CXR  - anticipate chest tube to suction at least one more day, possible water seal tomorrow  - medical management of comorbidities as per primary medical team  - follow up morning chest x-ray, check/trend repeat WBC  - DVT prophylaxis, ambulation encouraged  All of the above findings and recommendations were discussed with the patient and patient's family, and all of patient's and family's questions were answered to their expressed satisfaction.  -- Scherrie Gerlach Earlene Plater, MD, RPVI [place]: [facility] General Surgery - Partnering for exceptional care. Office: [phone]      SURGICAL PROGRESS NOTE  Hospital Day(s): 8.   Post op day(s): 2 Days Post-Op.   Interval History: Patient seen and examined, no acute events or new complaints overnight. Patient reports that he is still having right chest discomfort but no complaints of SOB or fever.  Review of Systems:  Constitutional: denies fever, chills  HEENT: denies cough or congestion  Respiratory: denies any shortness of breath  Cardiovascular:+ chest pain or palpitations  Gastrointestinal: denies abdominal pain, N/V, or diarrhea/and bowel function as per interval history Genitourinary: denies burning with urination or urinary frequency Musculoskeletal: denies pain, decreased motor or sensation Neurological: denies HA or vision/hearing changes   Vital signs in last 24  hours: [min-max] current  Temp:  [97.9 F (36.6 C)-98.7 F (37.1 C)] 98.1 F (36.7 C) (10/04 1211) Pulse Rate:  [77-93] 88 (10/04 1211) Resp:  [18-20] 18 (10/04 1211) BP: (108-129)/(73-89) 108/74 (10/04 1211) SpO2:  [98 %-100 %] 99 % (10/04 1211)     Height: 6\' 1"  (185.4 cm) Weight: 130.8 kg BMI (Calculated): 38.05   Intake/Output this shift:  Total I/O In: 240 [P.O.:240] Out: 230 [Urine:200; Chest Tube:30]   Intake/Output last 2 shifts:  @IOLAST2SHIFTS @   Physical Exam:  Constitutional: alert, cooperative and no distress  HENT: normocephalic without obvious abnormality  Eyes: PERRL, EOM's grossly intact and symmetric  Neuro: CN II - XII grossly intact and symmetric without deficit  Respiratory: breathing non-labored at rest, no adventitious breath sounds Cardiovascular: regular rate and sinus rhythm  Chest: 3 chest tube to the right lateral chest wall, dressings in place, small air leak.  Musculoskeletal: no edema or wounds, motor and sensation grossly intact, NT     Labs:  CBC Latest Ref Rng & Units 07/24/2018 07/23/2018 07/25/2018  WBC 3.8 - 10.6 K/uL 14.0(H) 14.3(H) 13.9(H)  Hemoglobin 13.0 - 18.0 g/dL 10.9(L) 11.4(L) 11.9(L)  Hematocrit 40.0 - 52.0 % 32.1(L) 33.9(L) 35.0(L)  Platelets 150 - 440 K/uL 204 226 209   CMP Latest Ref Rng & Units 07/23/2018 08/03/2018 07/21/2018  Glucose 70 - 99 mg/dL 098(J) 191(Y) 782(N)  BUN 8 - 23 mg/dL 11 14 13   Creatinine 0.61 - 1.24 mg/dL 5.62 1.30 8.65  Sodium 135 - 145 mmol/L 137 135 137  Potassium 3.5 - 5.1 mmol/L 5.0 4.1 4.0  Chloride 98 - 111 mmol/L 105 104 105  CO2 22 - 32 mmol/L 26 24 26   Calcium 8.9 -  10.3 mg/dL 1.6(X) 0.9(U) 8.9  Total Protein 6.5 - 8.1 g/dL - - 7.2  Total Bilirubin 0.3 - 1.2 mg/dL - - 0.5  Alkaline Phos 38 - 126 U/L - - 37(L)  AST 15 - 41 U/L - - 20  ALT 0 - 44 U/L - - 14    Imaging studies:  CXR 10/04  IMPRESSION: Stable position of 3 right-sided chest tubes without pneumothorax. Stable loculated  right pleural effusion. Stable bibasilar subsegmental atelectasis with minimal left pleural effusion. Stable right internal jugular catheter is noted with tip in right atrium.   Assessment/Plan: (ICD-10's: J94.2) 67 y.o. male with right hemothorax 2 Days Post-Op s/p thoracotomy and decortication for right hemothorax, complicated by pertinent comorbidities including history of PE, MI, sleep apnea, and anticoagulation need.              - He is doing well POD1              - Chest tubes to remain on suction for today, continue daily CXR and monitor chest tube output. Likely can go to water seal Saturday and chest tubes can be removed sequentially based on output.               - Leukosytosis unchanged, likely stress reaction from surgery, continue to trend             - Continue pain control             - Encouraged aggressive pulmonary toilet, IS use             - Mobilize             - Transfer to floor this afternoon, appreciate CCM help             - Medicine primary, appreciate their help    All of the above findings and recommendations were discussed with the patient, and the medical team, and all of patient'squestions were answered to his expressed satisfaction.  Thank you for the opportunity to participate in this patient's care.  -- Lynden Oxford, PA-C  [facility] 07/23/2018, 9:02 AM [phone] M-F: 7am - 4pm

## 2018-07-25 ENCOUNTER — Inpatient Hospital Stay: Payer: Medicare PPO

## 2018-07-25 LAB — CBC
HEMATOCRIT: 31.3 % — AB (ref 40.0–52.0)
Hemoglobin: 9.9 g/dL — ABNORMAL LOW (ref 13.0–18.0)
MCH: 30.3 pg (ref 26.0–34.0)
MCHC: 31.7 g/dL — AB (ref 32.0–36.0)
MCV: 95.6 fL (ref 80.0–100.0)
Platelets: 223 10*3/uL (ref 150–440)
RBC: 3.28 MIL/uL — ABNORMAL LOW (ref 4.40–5.90)
RDW: 14 % (ref 11.5–14.5)
WBC: 16.9 10*3/uL — ABNORMAL HIGH (ref 3.8–10.6)

## 2018-07-25 LAB — GLUCOSE, CAPILLARY: Glucose-Capillary: 171 mg/dL — ABNORMAL HIGH (ref 70–99)

## 2018-07-25 LAB — COMPREHENSIVE METABOLIC PANEL
ALT: 16 U/L (ref 0–44)
ANION GAP: 13 (ref 5–15)
AST: 28 U/L (ref 15–41)
Albumin: 3.3 g/dL — ABNORMAL LOW (ref 3.5–5.0)
Alkaline Phosphatase: 42 U/L (ref 38–126)
BILIRUBIN TOTAL: 1.6 mg/dL — AB (ref 0.3–1.2)
BUN: 35 mg/dL — AB (ref 8–23)
CO2: 23 mmol/L (ref 22–32)
Calcium: 9.1 mg/dL (ref 8.9–10.3)
Chloride: 97 mmol/L — ABNORMAL LOW (ref 98–111)
Creatinine, Ser: 3.5 mg/dL — ABNORMAL HIGH (ref 0.61–1.24)
GFR calc Af Amer: 19 mL/min — ABNORMAL LOW (ref >60)
GFR calc non Af Amer: 17 mL/min — ABNORMAL LOW (ref >60)
Glucose, Bld: 195 mg/dL — ABNORMAL HIGH (ref 70–99)
POTASSIUM: 5 mmol/L (ref 3.5–5.1)
Sodium: 133 mmol/L — ABNORMAL LOW (ref 135–145)
TOTAL PROTEIN: 7.9 g/dL (ref 6.5–8.1)

## 2018-07-25 MED ORDER — SODIUM CHLORIDE 0.9 % IV SOLN
INTRAVENOUS | Status: DC
  Administered 2018-07-25 – 2018-07-26 (×2): via INTRAVENOUS

## 2018-07-25 MED ORDER — SODIUM CHLORIDE 0.9 % IV BOLUS
1000.0000 mL | Freq: Once | INTRAVENOUS | Status: AC
  Administered 2018-07-25: 1000 mL via INTRAVENOUS

## 2018-07-25 MED ORDER — ALBUTEROL SULFATE (2.5 MG/3ML) 0.083% IN NEBU
2.5000 mg | INHALATION_SOLUTION | Freq: Three times a day (TID) | RESPIRATORY_TRACT | Status: DC
  Administered 2018-07-25 – 2018-07-26 (×2): 2.5 mg via RESPIRATORY_TRACT
  Filled 2018-07-25 (×3): qty 3

## 2018-07-25 MED ORDER — CYCLOBENZAPRINE HCL 10 MG PO TABS
5.0000 mg | ORAL_TABLET | Freq: Two times a day (BID) | ORAL | Status: DC | PRN
  Filled 2018-07-25 (×2): qty 0.5

## 2018-07-25 MED ORDER — SODIUM CHLORIDE 0.9 % IV BOLUS
500.0000 mL | Freq: Once | INTRAVENOUS | Status: AC
  Administered 2018-07-25: 500 mL via INTRAVENOUS

## 2018-07-25 MED ORDER — FLEET ENEMA 7-19 GM/118ML RE ENEM
1.0000 | ENEMA | Freq: Once | RECTAL | Status: AC
  Administered 2018-07-25: 1 via RECTAL

## 2018-07-25 MED ORDER — GABAPENTIN 600 MG PO TABS
600.0000 mg | ORAL_TABLET | Freq: Three times a day (TID) | ORAL | Status: DC
  Administered 2018-07-25 – 2018-07-27 (×8): 600 mg via ORAL
  Filled 2018-07-25 (×10): qty 1

## 2018-07-25 MED ORDER — LACTATED RINGERS IV SOLN
INTRAVENOUS | Status: DC
  Administered 2018-07-25: 10:00:00 via INTRAVENOUS

## 2018-07-25 MED ORDER — ACETAMINOPHEN 500 MG PO TABS
1000.0000 mg | ORAL_TABLET | Freq: Four times a day (QID) | ORAL | Status: DC
  Administered 2018-07-25 – 2018-07-26 (×3): 1000 mg via ORAL
  Filled 2018-07-25 (×3): qty 2

## 2018-07-25 NOTE — Progress Notes (Addendum)
[facility] - [place] at [facility]   PATIENT NAME: Jeff Leonard    MR#:  [id number]  DATE OF BIRTH:  [birth year]  SUBJECTIVE:  Reporting right leg pain with cramps, denies any shortness of breath Has chest tubes placed in for right-sided loculated pleural effusion/pneumothorax.  Wife at bedside  REVIEW OF SYSTEMS:   Review of Systems  Constitutional: Negative for chills, fever and weight loss.  HENT: Negative for ear discharge, ear pain and nosebleeds.   Eyes: Negative for blurred vision, pain and discharge.  Respiratory: Negative for sputum production, shortness of breath, wheezing and stridor.   Cardiovascular: Positive for chest pain. Negative for palpitations, orthopnea and PND.  Gastrointestinal: Negative for abdominal pain, diarrhea, nausea and vomiting.  Genitourinary: Negative for frequency and urgency.  Musculoskeletal: Negative for back pain and joint pain.  Neurological: Negative for sensory change, speech change, focal weakness and weakness.  Psychiatric/Behavioral: Negative for depression and hallucinations. The patient is not nervous/anxious.    Tolerating Diet:yesTolerating PT: ambulatory  DRUG ALLERGIES:  No Known Allergies  VITALS:  Blood pressure 98/74, pulse (!) 115, temperature (!) 97.5 F (36.4 C), temperature source Oral, resp. rate 16, height 6\' 1"  (1.854 m), weight 130.8 kg, SpO2 100 %.  PHYSICAL EXAMINATION:   Physical Exam  GENERAL:  67 y.o.-year-old patient lying in the bed with no acute distress. obese EYES: Pupils equal, round, reactive to light and accommodation. No scleral icterus. Extraocular muscles intact.  HEENT: Head atraumatic, normocephalic. Oropharynx and nasopharynx clear.  NECK:  Supple, no jugular venous distention. No thyroid enlargement, no tenderness.  LUNGS: Normal breath sounds bilaterally, no wheezing, rales, rhonchi. No use of accessory muscles of respiration.  3Right sided CT + CARDIOVASCULAR: S1, S2  normal. No murmurs, rubs, or gallops.  ABDOMEN: Soft, nontender, nondistended. Bowel sounds present. No organomegaly or mass.  EXTREMITIES: No cyanosis, clubbing or edema b/l.    NEUROLOGIC: Cranial nerves II through XII are intact. No focal Motor or sensory deficits b/l.   PSYCHIATRIC:  patient is alert and oriented x 3.  SKIN: No obvious rash, lesion, or ulcer.   LABORATORY PANEL:  CBC Recent Labs  Lab 07/25/18 0605  WBC 16.9*  HGB 9.9*  HCT 31.3*  PLT 223    Chemistries  Recent Labs  Lab 07/25/18 0605  NA 133*  K 5.0  CL 97*  CO2 23  GLUCOSE 195*  BUN 35*  CREATININE 3.50*  CALCIUM 9.1  AST 28  ALT 16  ALKPHOS 42  BILITOT 1.6*   Cardiac Enzymes No results for input(s): TROPONINI in the last 168 hours. RADIOLOGY:  US Renal  Result Date: 07/25/2018 CLINICAL DATA:  Acute kidney injury, acute renal failure EXAM: RENAL / URINARY TRACT ULTRASOUND COMPLETE COMPARISON:  None FINDINGS: Right Kidney: Length: 10.9 cm. Cortical thinning. Increased cortical echogenicity. No mass, hydronephrosis or shadowing calcification. Left Kidney: Length: 10.5 cm. Marked cortical thinning. Increased cortical echogenicity. No gross evidence of mass or hydronephrosis, less well visualized than RIGHT kidney due to body habitus and poor acoustic window Bladder: Decompressed by Foley catheter, unable to evaluate IMPRESSION: BILATERAL renal cortical atrophy and medical renal disease changes. No gross renal mass or hydronephrosis identified. Electronically Signed   By: Ulyses Southward M.D.   On: 07/25/2018 12:51   US Venous Img Lower Bilateral  Result Date: 07/23/2018 CLINICAL DATA:  History of DVT and pulmonary embolism. Evaluate for acute or chronic DVT. EXAM: BILATERAL LOWER EXTREMITY VENOUS DOPPLER ULTRASOUND TECHNIQUE: Gray-scale sonography with graded  compression, as well as color Doppler and duplex ultrasound were performed to evaluate the lower extremity deep venous systems from the level of the  common femoral vein and including the common femoral, femoral, profunda femoral, popliteal and calf veins including the posterior tibial, peroneal and gastrocnemius veins when visible. The superficial great saphenous vein was also interrogated. Spectral Doppler was utilized to evaluate flow at rest and with distal augmentation maneuvers in the common femoral, femoral and popliteal veins. COMPARISON:  Bilateral lower extremity venous Doppler ultrasound-03/25/2017 FINDINGS: Examination is degraded due to patient body habitus and poor sonographic window. RIGHT LOWER EXTREMITY Common Femoral Vein: No evidence of thrombus. Normal compressibility, respiratory phasicity and response to augmentation. Saphenofemoral Junction: No evidence of thrombus. Normal compressibility and flow on color Doppler imaging. Profunda Femoral Vein: No evidence of thrombus. Normal compressibility and flow on color Doppler imaging. Femoral Vein: No evidence of thrombus. Normal compressibility, respiratory phasicity and response to augmentation. Popliteal Vein: No evidence of thrombus. Normal compressibility, respiratory phasicity and response to augmentation. Calf Veins: No evidence of thrombus. Normal compressibility and flow on color Doppler imaging. Superficial Great Saphenous Vein: No evidence of thrombus. Normal compressibility. Venous Reflux:  None. Other Findings:  None. LEFT LOWER EXTREMITY Common Femoral Vein: No evidence of thrombus. Normal compressibility, respiratory phasicity and response to augmentation. Saphenofemoral Junction: No evidence of thrombus. Normal compressibility and flow on color Doppler imaging. Profunda Femoral Vein: No evidence of thrombus. Normal compressibility and flow on color Doppler imaging. Femoral Vein: No evidence of thrombus. Normal compressibility, respiratory phasicity and response to augmentation. Popliteal Vein: No evidence of thrombus. Normal compressibility, respiratory phasicity and response to  augmentation. Calf Veins: No evidence of acute or chronic thrombus. Normal compressibility and flow on color Doppler imaging. Superficial Great Saphenous Vein: No evidence of thrombus. Normal compressibility. Venous Reflux:  None. Other Findings:  None. IMPRESSION: No evidence of acute or chronic DVT within either lower extremity with special attention paid to the left tibial veins. Electronically Signed   By: Simonne Come M.D.   On: 07/23/2018 16:57   Dg Chest Port 1 View  Result Date: 07/25/2018 CLINICAL DATA:  Chest tube in place EXAM: PORTABLE CHEST 1 VIEW COMPARISON:  07/24/2018 FINDINGS: Three left chest tubes remain in place, unchanged. Moderate right pleural effusion. No pneumothorax. Right central line is unchanged. Patchy right lung and left basilar airspace opacities are stable. IMPRESSION: Stable right chest tubes without pneumothorax and right pleural effusion. Right lung and left basilar airspace opacities are stable. Electronically Signed   By: Charlett Nose M.D.   On: 07/25/2018 07:40   Dg Chest Port 1 View  Result Date: 07/24/2018 CLINICAL DATA:  Chest tube in place. EXAM: PORTABLE CHEST 1 VIEW COMPARISON:  None. FINDINGS: Cardiomediastinal silhouette is unchanged in position. 3 right-sided chest tubes are noted. No definite pneumothorax is noted. Stable loculated right pleural effusion is noted. Stable bibasilar subsegmental atelectasis is noted. Minimal left pleural effusion is noted. Stable position of right internal jugular catheter with distal tip in right atrium. Bony thorax is unremarkable. IMPRESSION: Stable position of 3 right-sided chest tubes without pneumothorax. Stable loculated right pleural effusion. Stable bibasilar subsegmental atelectasis with minimal left pleural effusion. Stable right internal jugular catheter is noted with tip in right atrium. Electronically Signed   By: Lupita Raider, M.D.   On: 07/24/2018 07:20   Dg Abd Portable 2v  Result Date: 07/25/2018 CLINICAL  DATA:  Abdominal distention and pain EXAM: PORTABLE ABDOMEN - 2 VIEW COMPARISON:  None FINDINGS: Moderate to large stool burden throughout the colon with mild gaseous distention of the colon which may reflect ileus. No evidence of bowel obstruction. No free air or organomegaly. Right chest tubes partially imaged. IVC filter in place. IMPRESSION: Moderate to large stool burden with mild gaseous distention of the colon. This may reflect ileus. Electronically Signed   By: Charlett Nose M.D.   On: 07/25/2018 07:19   ASSESSMENT AND PLAN:   67 year old admitted with loculated pleural effusion on CT surgery service  *AKI Hydrate with IV fluids Renal ultrasound-bilateral renal cortical atrophy and medical renal disease change Avoid nephrotoxins and NSAIDs Monitor renal function closely and if no improvement will consider nephrology consult Flexeril changed to twice daily as needed  *Right loculated pleural effusion-chronic -S/p right chest tube placement on July 17, 2018 by Dr. Oakes/cardiothoracic surgery- -s/p pleurodesis/decortication on oct 2nd 2019 -chest tube management per surgery, not considering chest tube removal today as patient was complaining of chest wall pain.  Repeat chest x-ray in a.m.  *Acute right leg pain with muscle cramp Pain meds as needed, muscle relaxants  *History of recurrent pulmonary embolism and DVT Vascular surgery consulted - S/p IVC filter placement -pt currently off xarelto. Had been on it for 4 years.  -Ultrasound bilateral LE negative.  As ultrasound negative  consider not restarting anticoagulation -will d/w PCP dr Dario Guardian on monday  *Hypertension  continue therapy with lisinopril  *Glaucoma  continue eyedrops  *DVT prophylaxis with SCDs.  Case discussed with Care Management/Social Worker. Management plans discussed with the patient, family and they are in agreement.  CODE STATUS: full  DVT Prophylaxis: SCD  TOTAL TIME TAKING CARE OF THIS  PATIENT: .  >50% time spent on counselling and coordination of care  POSSIBLE D/C IN *1-2* DAYS, DEPENDING ON CLINICAL CONDITION.  Note: This dictation was prepared with Dragon dictation along with smaller phrase technology. Any transcriptional errors that result from this process are unintentional.  Ramonita Lab M.D on 07/25/2018 at 2:45 PM  Between 7am to 6pm - Pager - [phone]  After 6pm go to [email or web address] - Social research officer, government  [facility]  Office  [phone]  CC: Primary care physician; Sherrie Mustache, MDPatient ID: [name], male   DOB: 09/04/1951, 67 y.o.   MRN: [id number]

## 2018-07-25 NOTE — Progress Notes (Signed)
Notified Dr Sheryle Hail and Dr Everlene Farrier of change in status, hypotensive, vomiting, abdominal distention, tachycardia, denies chest pain, EKG completed, chest x ray completed, labs sent, pt a/o, diaphoretic just states he has a "lot of gas", support given to pt and wife throughout. IV Bolus running.

## 2018-07-25 NOTE — Progress Notes (Signed)
CC: s/p right decortication Subjective:  Having some leg pain and chest wall pain. CT 150cc ( not completely accurate since there is some fluid on the other chamber) CXR pers reviewed, no ptx, lungs well inflated CREAT 3.5, decrease bp responded to crystalloid ABd xray w some stool, no free air  Objective: Vital signs in last 24 hours: Temp:  [97.5 F (36.4 C)-98.8 F (37.1 C)] 97.5 F (36.4 C) (10/05 1159) Pulse Rate:  [59-127] 115 (10/05 1159) Resp:  [16-24] 16 (10/05 1159) BP: (72-98)/(48-74) 98/74 (10/05 1159) SpO2:  [93 %-100 %] 100 % (10/05 1159) Last BM Date: 07/23/18  Intake/Output from previous day: 10/04 0701 - 10/05 0700 In: 240 [P.O.:240] Out: 270 [Urine:200; Chest Tube:70] Intake/Output this shift: Total I/O In: 1154.6 [I.V.:168.5; IV Piggyback:986.2] Out: 20 [Chest Tube:20]  Physical exam:  NAD, alert Chest: incision c/d/i, no infection, ct in place no air leak Abd: soft, mild distension, no peritontis  Lab Results: CBC  Recent Labs    07/24/18 0403 07/25/18 0605  WBC 14.0* 16.9*  HGB 10.9* 9.9*  HCT 32.1* 31.3*  PLT 204 223   BMET Recent Labs    07/23/18 0325 07/25/18 0605  NA 137 133*  K 5.0 5.0  CL 105 97*  CO2 26 23  GLUCOSE 154* 195*  BUN 11 35*  CREATININE 0.98 3.50*  CALCIUM 8.6* 9.1   PT/INR No results for input(s): LABPROT, INR in the last 72 hours. ABG No results for input(s): PHART, HCO3 in the last 72 hours.  Invalid input(s): PCO2, PO2  Studies/Results: US Renal  Result Date: 07/25/2018 CLINICAL DATA:  Acute kidney injury, acute renal failure EXAM: RENAL / URINARY TRACT ULTRASOUND COMPLETE COMPARISON:  None FINDINGS: Right Kidney: Length: 10.9 cm. Cortical thinning. Increased cortical echogenicity. No mass, hydronephrosis or shadowing calcification. Left Kidney: Length: 10.5 cm. Marked cortical thinning. Increased cortical echogenicity. No gross evidence of mass or hydronephrosis, less well visualized than RIGHT kidney  due to body habitus and poor acoustic window Bladder: Decompressed by Foley catheter, unable to evaluate IMPRESSION: BILATERAL renal cortical atrophy and medical renal disease changes. No gross renal mass or hydronephrosis identified. Electronically Signed   By: Ulyses Southward M.D.   On: 07/25/2018 12:51   US Venous Img Lower Bilateral  Result Date: 07/23/2018 CLINICAL DATA:  History of DVT and pulmonary embolism. Evaluate for acute or chronic DVT. EXAM: BILATERAL LOWER EXTREMITY VENOUS DOPPLER ULTRASOUND TECHNIQUE: Gray-scale sonography with graded compression, as well as color Doppler and duplex ultrasound were performed to evaluate the lower extremity deep venous systems from the level of the common femoral vein and including the common femoral, femoral, profunda femoral, popliteal and calf veins including the posterior tibial, peroneal and gastrocnemius veins when visible. The superficial great saphenous vein was also interrogated. Spectral Doppler was utilized to evaluate flow at rest and with distal augmentation maneuvers in the common femoral, femoral and popliteal veins. COMPARISON:  Bilateral lower extremity venous Doppler ultrasound-03/25/2017 FINDINGS: Examination is degraded due to patient body habitus and poor sonographic window. RIGHT LOWER EXTREMITY Common Femoral Vein: No evidence of thrombus. Normal compressibility, respiratory phasicity and response to augmentation. Saphenofemoral Junction: No evidence of thrombus. Normal compressibility and flow on color Doppler imaging. Profunda Femoral Vein: No evidence of thrombus. Normal compressibility and flow on color Doppler imaging. Femoral Vein: No evidence of thrombus. Normal compressibility, respiratory phasicity and response to augmentation. Popliteal Vein: No evidence of thrombus. Normal compressibility, respiratory phasicity and response to augmentation. Calf Veins: No evidence of  thrombus. Normal compressibility and flow on color Doppler imaging.  Superficial Great Saphenous Vein: No evidence of thrombus. Normal compressibility. Venous Reflux:  None. Other Findings:  None. LEFT LOWER EXTREMITY Common Femoral Vein: No evidence of thrombus. Normal compressibility, respiratory phasicity and response to augmentation. Saphenofemoral Junction: No evidence of thrombus. Normal compressibility and flow on color Doppler imaging. Profunda Femoral Vein: No evidence of thrombus. Normal compressibility and flow on color Doppler imaging. Femoral Vein: No evidence of thrombus. Normal compressibility, respiratory phasicity and response to augmentation. Popliteal Vein: No evidence of thrombus. Normal compressibility, respiratory phasicity and response to augmentation. Calf Veins: No evidence of acute or chronic thrombus. Normal compressibility and flow on color Doppler imaging. Superficial Great Saphenous Vein: No evidence of thrombus. Normal compressibility. Venous Reflux:  None. Other Findings:  None. IMPRESSION: No evidence of acute or chronic DVT within either lower extremity with special attention paid to the left tibial veins. Electronically Signed   By: Simonne Come M.D.   On: 07/23/2018 16:57   Dg Chest Port 1 View  Result Date: 07/25/2018 CLINICAL DATA:  Chest tube in place EXAM: PORTABLE CHEST 1 VIEW COMPARISON:  07/24/2018 FINDINGS: Three left chest tubes remain in place, unchanged. Moderate right pleural effusion. No pneumothorax. Right central line is unchanged. Patchy right lung and left basilar airspace opacities are stable. IMPRESSION: Stable right chest tubes without pneumothorax and right pleural effusion. Right lung and left basilar airspace opacities are stable. Electronically Signed   By: Charlett Nose M.D.   On: 07/25/2018 07:40   Dg Chest Port 1 View  Result Date: 07/24/2018 CLINICAL DATA:  Chest tube in place. EXAM: PORTABLE CHEST 1 VIEW COMPARISON:  None. FINDINGS: Cardiomediastinal silhouette is unchanged in position. 3 right-sided chest tubes  are noted. No definite pneumothorax is noted. Stable loculated right pleural effusion is noted. Stable bibasilar subsegmental atelectasis is noted. Minimal left pleural effusion is noted. Stable position of right internal jugular catheter with distal tip in right atrium. Bony thorax is unremarkable. IMPRESSION: Stable position of 3 right-sided chest tubes without pneumothorax. Stable loculated right pleural effusion. Stable bibasilar subsegmental atelectasis with minimal left pleural effusion. Stable right internal jugular catheter is noted with tip in right atrium. Electronically Signed   By: Lupita Raider, M.D.   On: 07/24/2018 07:20   Dg Abd Portable 2v  Result Date: 07/25/2018 CLINICAL DATA:  Abdominal distention and pain EXAM: PORTABLE ABDOMEN - 2 VIEW COMPARISON:  None FINDINGS: Moderate to large stool burden throughout the colon with mild gaseous distention of the colon which may reflect ileus. No evidence of bowel obstruction. No free air or organomegaly. Right chest tubes partially imaged. IVC filter in place. IMPRESSION: Moderate to large stool burden with mild gaseous distention of the colon. This may reflect ileus. Electronically Signed   By: Charlett Nose M.D.   On: 07/25/2018 07:19    Anti-infectives: Anti-infectives (From admission, onward)   Start     Dose/Rate Route Frequency Ordered Stop   07/21/2018 2030  ceFAZolin (ANCEF) IVPB 2g/100 mL premix     2 g 200 mL/hr over 30 Minutes Intravenous Every 8 hours 08/11/2018 1516 07/23/18 0400   07/21/18 2105  ceFAZolin (ANCEF) IVPB 2g/100 mL premix     2 g 200 mL/hr over 30 Minutes Intravenous 30 min pre-op 07/21/18 2105 07/31/2018 1235   2018/08/01 1230  ceFAZolin (ANCEF) IVPB 2g/100 mL premix    Note to Pharmacy:  Send with patient to Specials   2 g 200 mL/hr  over 30 Minutes Intravenous On call 06/29/2018 1229 07/10/2018 1555      Assessment/Plan: Unsure of why his creat is up, he is definitely dry Medicine to start w/u from renal  perspective From surgical perspective no evidence of active bleeding and no need for acute surgical intervention Added gabapentin for pain control cxr in am For now given all these acute events will not remove any CT pulm toilet  Sterling Big, MD, FACS  07/25/2018

## 2018-07-25 NOTE — Progress Notes (Addendum)
Per MD okay for RN to place order for iv fluids. Notified MD pt is complaining of right leg pain. No new orders at this point.

## 2018-07-26 ENCOUNTER — Inpatient Hospital Stay: Payer: Medicare PPO

## 2018-07-26 DIAGNOSIS — N179 Acute kidney failure, unspecified: Secondary | ICD-10-CM

## 2018-07-26 LAB — RENAL FUNCTION PANEL
ALBUMIN: 3.1 g/dL — AB (ref 3.5–5.0)
ALBUMIN: 2.9 g/dL — AB (ref 3.5–5.0)
ANION GAP: 13 (ref 5–15)
ANION GAP: 15 (ref 5–15)
Albumin: 2.9 g/dL — ABNORMAL LOW (ref 3.5–5.0)
Anion gap: 13 (ref 5–15)
BUN: 59 mg/dL — AB (ref 8–23)
BUN: 48 mg/dL — ABNORMAL HIGH (ref 8–23)
BUN: 51 mg/dL — ABNORMAL HIGH (ref 8–23)
CALCIUM: 8.1 mg/dL — AB (ref 8.9–10.3)
CHLORIDE: 100 mmol/L (ref 98–111)
CO2: 21 mmol/L — ABNORMAL LOW (ref 22–32)
CO2: 22 mmol/L (ref 22–32)
CO2: 20 mmol/L — ABNORMAL LOW (ref 22–32)
CREATININE: 5.51 mg/dL — AB (ref 0.61–1.24)
Calcium: 8.2 mg/dL — ABNORMAL LOW (ref 8.9–10.3)
Calcium: 7.9 mg/dL — ABNORMAL LOW (ref 8.9–10.3)
Chloride: 100 mmol/L (ref 98–111)
Chloride: 100 mmol/L (ref 98–111)
Creatinine, Ser: 4.27 mg/dL — ABNORMAL HIGH (ref 0.61–1.24)
Creatinine, Ser: 5.19 mg/dL — ABNORMAL HIGH (ref 0.61–1.24)
GFR calc Af Amer: 11 mL/min — ABNORMAL LOW (ref >60)
GFR calc Af Amer: 15 mL/min — ABNORMAL LOW (ref >60)
GFR calc non Af Amer: 10 mL/min — ABNORMAL LOW (ref >60)
GFR calc non Af Amer: 13 mL/min — ABNORMAL LOW (ref >60)
GFR calc non Af Amer: 10 mL/min — ABNORMAL LOW (ref >60)
GFR, EST AFRICAN AMERICAN: 12 mL/min — AB (ref >60)
GLUCOSE: 149 mg/dL — AB (ref 70–99)
GLUCOSE: 144 mg/dL — AB (ref 70–99)
Glucose, Bld: 155 mg/dL — ABNORMAL HIGH (ref 70–99)
PHOSPHORUS: 6.6 mg/dL — AB (ref 2.5–4.6)
Phosphorus: 6.3 mg/dL — ABNORMAL HIGH (ref 2.5–4.6)
Phosphorus: 5.1 mg/dL — ABNORMAL HIGH (ref 2.5–4.6)
Potassium: 5.7 mmol/L — ABNORMAL HIGH (ref 3.5–5.1)
Potassium: 4.5 mmol/L (ref 3.5–5.1)
Potassium: 4.8 mmol/L (ref 3.5–5.1)
SODIUM: 135 mmol/L (ref 135–145)
Sodium: 134 mmol/L — ABNORMAL LOW (ref 135–145)
Sodium: 135 mmol/L (ref 135–145)

## 2018-07-26 LAB — COMPREHENSIVE METABOLIC PANEL
ALK PHOS: 50 U/L (ref 38–126)
ALT: 21 U/L (ref 0–44)
ANION GAP: 15 (ref 5–15)
AST: 33 U/L (ref 15–41)
Albumin: 3.2 g/dL — ABNORMAL LOW (ref 3.5–5.0)
BILIRUBIN TOTAL: 1.1 mg/dL (ref 0.3–1.2)
BUN: 57 mg/dL — ABNORMAL HIGH (ref 8–23)
CALCIUM: 8.2 mg/dL — AB (ref 8.9–10.3)
CO2: 18 mmol/L — ABNORMAL LOW (ref 22–32)
CREATININE: 5.45 mg/dL — AB (ref 0.61–1.24)
Chloride: 99 mmol/L (ref 98–111)
GFR, EST AFRICAN AMERICAN: 11 mL/min — AB (ref >60)
GFR, EST NON AFRICAN AMERICAN: 10 mL/min — AB (ref >60)
Glucose, Bld: 142 mg/dL — ABNORMAL HIGH (ref 70–99)
Potassium: 5.9 mmol/L — ABNORMAL HIGH (ref 3.5–5.1)
Sodium: 132 mmol/L — ABNORMAL LOW (ref 135–145)
TOTAL PROTEIN: 7.4 g/dL (ref 6.5–8.1)

## 2018-07-26 LAB — MAGNESIUM
Magnesium: 2 mg/dL (ref 1.7–2.4)
Magnesium: 1.7 mg/dL (ref 1.7–2.4)

## 2018-07-26 LAB — GLUCOSE, CAPILLARY: GLUCOSE-CAPILLARY: 121 mg/dL — AB (ref 70–99)

## 2018-07-26 LAB — FIBRIN DERIVATIVES D-DIMER (ARMC ONLY)

## 2018-07-26 LAB — PHOSPHORUS: PHOSPHORUS: 6.4 mg/dL — AB (ref 2.5–4.6)

## 2018-07-26 MED ORDER — SODIUM CHLORIDE 0.9 % IV SOLN: 100.0000 mL | INTRAVENOUS | Status: DC | PRN

## 2018-07-26 MED ORDER — PENTAFLUOROPROP-TETRAFLUOROETH EX AERO
1.0000 "application " | INHALATION_SPRAY | CUTANEOUS | Status: DC | PRN
  Filled 2018-07-26: qty 30

## 2018-07-26 MED ORDER — ALBUTEROL SULFATE (2.5 MG/3ML) 0.083% IN NEBU: 2.5000 mg | INHALATION_SOLUTION | Freq: Four times a day (QID) | RESPIRATORY_TRACT | Status: DC | PRN

## 2018-07-26 MED ORDER — LIDOCAINE HCL (PF) 1 % IJ SOLN
5.0000 mL | INTRAMUSCULAR | Status: DC | PRN
  Filled 2018-07-26: qty 5

## 2018-07-26 MED ORDER — ALTEPLASE 2 MG IJ SOLR: 2.0000 mg | Freq: Once | INTRAMUSCULAR | Status: DC | PRN

## 2018-07-26 MED ORDER — HEPARIN SODIUM (PORCINE) 1000 UNIT/ML DIALYSIS
1000.0000 [IU] | INTRAMUSCULAR | Status: DC | PRN
  Filled 2018-07-26: qty 6

## 2018-07-26 MED ORDER — LIDOCAINE-PRILOCAINE 2.5-2.5 % EX CREA
1.0000 "application " | TOPICAL_CREAM | CUTANEOUS | Status: DC | PRN
  Filled 2018-07-26: qty 5

## 2018-07-26 MED ORDER — ONDANSETRON HCL 4 MG/2ML IJ SOLN
4.0000 mg | Freq: Four times a day (QID) | INTRAMUSCULAR | Status: DC | PRN
  Administered 2018-07-26 – 2018-07-27 (×3): 4 mg via INTRAVENOUS
  Filled 2018-07-26: qty 2

## 2018-07-26 MED ORDER — CHLORHEXIDINE GLUCONATE CLOTH 2 % EX PADS
6.0000 | MEDICATED_PAD | Freq: Every day | CUTANEOUS | Status: DC
  Administered 2018-07-27: 6 via TOPICAL

## 2018-07-26 MED ORDER — SODIUM CHLORIDE 0.9 % IV SOLN
INTRAVENOUS | Status: AC
  Administered 2018-07-26 – 2018-07-27 (×4): via INTRAVENOUS

## 2018-07-26 MED ORDER — HEPARIN SODIUM (PORCINE) 1000 UNIT/ML DIALYSIS: 1000.0000 [IU] | INTRAMUSCULAR | Status: DC | PRN

## 2018-07-26 MED ORDER — METOCLOPRAMIDE HCL 5 MG/ML IJ SOLN
5.0000 mg | Freq: Three times a day (TID) | INTRAMUSCULAR | Status: DC | PRN
  Administered 2018-07-26 – 2018-07-27 (×3): 5 mg via INTRAVENOUS
  Filled 2018-07-26 (×3): qty 2

## 2018-07-26 MED ORDER — NOREPINEPHRINE 4 MG/250ML-% IV SOLN
0.0000 ug/min | INTRAVENOUS | Status: DC
  Administered 2018-07-26: 2 ug/min via INTRAVENOUS
  Administered 2018-07-27: 20 ug/min via INTRAVENOUS
  Administered 2018-07-27: 14 ug/min via INTRAVENOUS
  Filled 2018-07-26 (×2): qty 250

## 2018-07-26 MED ORDER — HEPARIN SOD (PORK) LOCK FLUSH 100 UNIT/ML IV SOLN
500.0000 [IU] | Freq: Once | INTRAVENOUS | Status: DC
  Filled 2018-07-26: qty 5

## 2018-07-26 MED ORDER — PUREFLOW DIALYSIS SOLUTION: INTRAVENOUS | Status: DC

## 2018-07-26 MED ORDER — SODIUM CHLORIDE 0.9 % IV BOLUS
500.0000 mL | Freq: Once | INTRAVENOUS | Status: AC
  Administered 2018-07-26: 500 mL via INTRAVENOUS

## 2018-07-26 MED ORDER — PROMETHAZINE HCL 25 MG/ML IJ SOLN: 12.5000 mg | Freq: Four times a day (QID) | INTRAMUSCULAR | Status: DC | PRN

## 2018-07-26 MED ORDER — SODIUM POLYSTYRENE SULFONATE 15 GM/60ML PO SUSP
30.0000 g | Freq: Once | ORAL | Status: AC
  Administered 2018-07-26: 30 g via ORAL
  Filled 2018-07-26: qty 120

## 2018-07-26 NOTE — Progress Notes (Signed)
This note also relates to the following rows which could not be included: Resp - Cannot attach notes to unvalidated device data BP - Cannot attach notes to unvalidated device data  Hd started  

## 2018-07-26 NOTE — Progress Notes (Signed)
Will proceed with conventional HD at the moment as BP has improved, no UF planned, if becomes unstable will need to revert to CRRT.

## 2018-07-26 NOTE — Progress Notes (Signed)
Dr Anne Hahn notified of nausea and vomiting, orders given

## 2018-07-26 NOTE — Progress Notes (Signed)
4 Days Post-Op   Subjective/Chief Complaint: Patient known to service. S/P IVC filter via the Right IJ for history of DVT/PE with hemothorax and inability to safely anticoagulate. Now with worsening renal function. Requested to evaluate for renal thrombus as potential etiology.   Objective: Vital signs in last 24 hours: Temp:  [97.5 F (36.4 C)-97.6 F (36.4 C)] 97.6 F (36.4 C) (10/06 0540) Pulse Rate:  [102-115] 108 (10/06 0540) Resp:  [16-24] 24 (10/06 0540) BP: (95-98)/(44-74) 95/44 (10/06 0540) SpO2:  [97 %-100 %] 97 % (10/06 0540) Last BM Date: 07/24/18  Intake/Output from previous day: 10/05 0701 - 10/06 0700 In: 2631.7 [I.V.:1173; IV Piggyback:1458.7] Out: 254 [Urine:225; Chest Tube:29] Intake/Output this shift: Total I/O In: 1310.6 [I.V.:810.6; IV Piggyback:500] Out: -   General appearance: sleepy,lethargic Resp: Clear anteriorly; diminished at right base Cardio: regular rate and rhythm GI: soft, non-tender; bowel sounds normal; no masses,  no organomegaly Extremities: extremities normal, atraumatic, no cyanosis or edema  Lab Results:  Recent Labs    07/24/18 0403 07/25/18 0605  WBC 14.0* 16.9*  HGB 10.9* 9.9*  HCT 32.1* 31.3*  PLT 204 223   BMET Recent Labs    07/25/18 0605 07/26/18 0424  NA 133* 132*  K 5.0 5.9*  CL 97* 99  CO2 23 18*  GLUCOSE 195* 142*  BUN 35* 57*  CREATININE 3.50* 5.45*  CALCIUM 9.1 8.2*   PT/INR No results for input(s): LABPROT, INR in the last 72 hours. ABG No results for input(s): PHART, HCO3 in the last 72 hours.  Invalid input(s): PCO2, PO2  Studies/Results: US Renal  Result Date: 07/25/2018 CLINICAL DATA:  Acute kidney injury, acute renal failure EXAM: RENAL / URINARY TRACT ULTRASOUND COMPLETE COMPARISON:  None FINDINGS: Right Kidney: Length: 10.9 cm. Cortical thinning. Increased cortical echogenicity. No mass, hydronephrosis or shadowing calcification. Left Kidney: Length: 10.5 cm. Marked cortical thinning.  Increased cortical echogenicity. No gross evidence of mass or hydronephrosis, less well visualized than RIGHT kidney due to body habitus and poor acoustic window Bladder: Decompressed by Foley catheter, unable to evaluate IMPRESSION: BILATERAL renal cortical atrophy and medical renal disease changes. No gross renal mass or hydronephrosis identified. Electronically Signed   By: Ulyses Southward M.D.   On: 07/25/2018 12:51   Dg Chest Port 1 View  Result Date: 07/26/2018 CLINICAL DATA:  Right chest tube EXAM: PORTABLE CHEST 1 VIEW COMPARISON:  07/25/2018 FINDINGS: Right chest tubes remain in place. No pneumothorax. Continued right pleural effusion, patchy opacities throughout the right lung and left base atelectasis. Heart is normal size. IMPRESSION: No significant change since prior study.  No pneumothorax. Electronically Signed   By: Charlett Nose M.D.   On: 07/26/2018 07:23   Dg Chest Port 1 View  Result Date: 07/25/2018 CLINICAL DATA:  Chest tube in place EXAM: PORTABLE CHEST 1 VIEW COMPARISON:  07/24/2018 FINDINGS: Three left chest tubes remain in place, unchanged. Moderate right pleural effusion. No pneumothorax. Right central line is unchanged. Patchy right lung and left basilar airspace opacities are stable. IMPRESSION: Stable right chest tubes without pneumothorax and right pleural effusion. Right lung and left basilar airspace opacities are stable. Electronically Signed   By: Charlett Nose M.D.   On: 07/25/2018 07:40   Dg Abd Portable 2v  Result Date: 07/25/2018 CLINICAL DATA:  Abdominal distention and pain EXAM: PORTABLE ABDOMEN - 2 VIEW COMPARISON:  None FINDINGS: Moderate to large stool burden throughout the colon with mild gaseous distention of the colon which may reflect ileus. No evidence  of bowel obstruction. No free air or organomegaly. Right chest tubes partially imaged. IVC filter in place. IMPRESSION: Moderate to large stool burden with mild gaseous distention of the colon. This may reflect  ileus. Electronically Signed   By: Charlett Nose M.D.   On: 07/25/2018 07:19    Anti-infectives: Anti-infectives (From admission, onward)   Start     Dose/Rate Route Frequency Ordered Stop   07/31/2018 2030  ceFAZolin (ANCEF) IVPB 2g/100 mL premix     2 g 200 mL/hr over 30 Minutes Intravenous Every 8 hours 07/28/2018 1516 07/23/18 0400   07/21/18 2105  ceFAZolin (ANCEF) IVPB 2g/100 mL premix     2 g 200 mL/hr over 30 Minutes Intravenous 30 min pre-op 07/21/18 2105 08/01/2018 1235   07/07/2018 1230  ceFAZolin (ANCEF) IVPB 2g/100 mL premix    Note to Pharmacy:  Send with patient to Specials   2 g 200 mL/hr over 30 Minutes Intravenous On call 07/08/2018 1229 06/30/2018 1555      Assessment/Plan: s/p Procedure(s): THORACOTOMY DECORTICATION (Right) BEDSIDE BRONCHOSCOPY FIBEROPTIC (Right) No clear Vascular etiology of worsening renal function  Renal vascular duplex study or CTA would be required to determine if perfusion has been compromised.  IVC filter would not prevent further venous or arterial thrombotic events. Unclear if patient has had hypercoagulable workup. Would obtain markers.  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 Can consider heparin if other factors have been ruled out. Patient should maintain chest tube.   LOS: 10 days    Eli Hose A 07/26/2018

## 2018-07-26 NOTE — Consult Note (Signed)
Reason for Consult:Acute Renal Failure Referring Physician: Dr. Johnsie [name] is an 67 y.o. male.  HPI: [name] is a 67 year old gentleman with a past medical history remarkable for obstructive sleep apnea, coronary artery disease, glaucoma, prior history of pulmonary embolism, patient with right-sided pleural effusion, hemothorax, status post thoracotomy with decortication, status post IVC filter developed progressive acute renal failure with hyperkalemia.  Being moved to the intensive care unit with tachycardia, marginal blood pressure, in need of hemodialysis.  Patient is responsive but somewhat lethargic  Past Medical History:  Diagnosis Date  . Glaucoma   . Hormone disorder   . Hx of pulmonary embolus   . Myocardial infarction ([place])   . Past heart attack   . Sleep apnea     Past Surgical History:  Procedure Laterality Date  . FIBEROPTIC BRONCHOSCOPY Right 08/11/2018   Procedure: BEDSIDE BRONCHOSCOPY FIBEROPTIC;  Surgeon: Nestor Lewandowsky, MD;  Location: [facility];  Service: Thoracic;  Laterality: Right;  . IVC FILTER INSERTION N/A 06/30/2018   Procedure: IVC FILTER INSERTION;  Surgeon: Katha Cabal, MD;  Location: [facility];  Service: Cardiovascular;  Laterality: N/A;  . SHOULDER SURGERY     left shoulder   . THORACOTOMY Right 08/13/2018   Procedure: THORACOTOMY DECORTICATION;  Surgeon: Nestor Lewandowsky, MD;  Location: [facility] OR[facility];  Service: Thoracic;  Laterality: Right;    Family History  Problem Relation Age of Onset  . Heart disease Father 55  . Hypertension Mother   . Cancer Mother        Breast cancer   . Asthma Maternal Grandfather   . Cancer Paternal Uncle        lung    Social History:  reports that he has never smoked. He has never used smokeless tobacco. He reports that he does not drink alcohol or use drugs.  Allergies: No Known Allergies  Medications: I have reviewed the patient's current medications.  Results for orders placed or performed  during the hospital encounter of 07/08/2018 (from the past 48 hour(s))  Glucose, capillary     Status: Abnormal   Collection Time: 07/25/18  5:47 AM  Result Value Ref Range   Glucose-Capillary 171 (H) 70 - 99 mg/dL   Comment 1 Notify RN   CBC     Status: Abnormal   Collection Time: 07/25/18  6:05 AM  Result Value Ref Range   WBC 16.9 (H) 3.8 - 10.6 K/uL   RBC 3.28 (L) 4.40 - 5.90 MIL/uL   Hemoglobin 9.9 (L) 13.0 - 18.0 g/dL   HCT 31.3 (L) 40.0 - 52.0 %   MCV 95.6 80.0 - 100.0 fL   MCH 30.3 26.0 - 34.0 pg   MCHC 31.7 (L) 32.0 - 36.0 g/dL   RDW 14.0 11.5 - 14.5 %   Platelets 223 150 - 440 K/uL    Comment: Performed at [facility], [address] [place], [place] [address]  Comprehensive metabolic panel     Status: Abnormal   Collection Time: 07/25/18  6:05 AM  Result Value Ref Range   Sodium 133 (L) 135 - 145 mmol/L   Potassium 5.0 3.5 - 5.1 mmol/L   Chloride 97 (L) 98 - 111 mmol/L   CO2 23 22 - 32 mmol/L   Glucose, Bld 195 (H) 70 - 99 mg/dL   BUN 35 (H) 8 - 23 mg/dL   Creatinine, Ser 3.50 (H) 0.61 - 1.24 mg/dL    Comment: RESULTS VERIFIED BY REPEAT TESTING [facility]  Calcium 9.1 8.9 - 10.3 mg/dL   Total Protein 7.9 6.5 - 8.1 g/dL   Albumin 3.3 (L) 3.5 - 5.0 g/dL   AST 28 15 - 41 U/L   ALT 16 0 - 44 U/L   Alkaline Phosphatase 42 38 - 126 U/L   Total Bilirubin 1.6 (H) 0.3 - 1.2 mg/dL   GFR calc non Af Amer 17 (L) >60 mL/min   GFR calc Af Amer 19 (L) >60 mL/min    Comment: (NOTE) The eGFR has been calculated using the CKD EPI equation. This calculation has not been validated in all clinical situations. eGFR's persistently <60 mL/min signify possible Chronic Kidney Disease.    Anion gap 13 5 - 15    Comment: Performed at [facility], [address] [place], [place] [address]  Comprehensive metabolic panel     Status: Abnormal   Collection Time: 07/26/18  4:24 AM  Result Value Ref Range   Sodium 132 (L) 135 - 145 mmol/L   Potassium 5.9 (H) 3.5 - 5.1  mmol/L   Chloride 99 98 - 111 mmol/L   CO2 18 (L) 22 - 32 mmol/L   Glucose, Bld 142 (H) 70 - 99 mg/dL   BUN 57 (H) 8 - 23 mg/dL   Creatinine, Ser 5.45 (H) 0.61 - 1.24 mg/dL   Calcium 8.2 (L) 8.9 - 10.3 mg/dL   Total Protein 7.4 6.5 - 8.1 g/dL   Albumin 3.2 (L) 3.5 - 5.0 g/dL   AST 33 15 - 41 U/L   ALT 21 0 - 44 U/L   Alkaline Phosphatase 50 38 - 126 U/L   Total Bilirubin 1.1 0.3 - 1.2 mg/dL   GFR calc non Af Amer 10 (L) >60 mL/min   GFR calc Af Amer 11 (L) >60 mL/min    Comment: (NOTE) The eGFR has been calculated using the CKD EPI equation. This calculation has not been validated in all clinical situations. eGFR's persistently <60 mL/min signify possible Chronic Kidney Disease.    Anion gap 15 5 - 15    Comment: Performed at [facility], [address] [place], [place] [address]  Fibrin derivatives D-Dimer [facility] only)     Status: Abnormal   Collection Time: 07/26/18  9:29 AM  Result Value Ref Range   Fibrin derivatives D-dimer ([facility]) >7,500.00 (H) 0.00 - 499.00 ng/mL (FEU)    Comment: (NOTE) <> Exclusion of Venous Thromboembolism (VTE) - OUTPATIENT ONLY   (Emergency Department or [place])   0-499 ng/ml (FEU): With a low to intermediate pretest probability                      for VTE this test result excludes the diagnosis                      of VTE.   >499 ng/ml (FEU) : VTE not excluded; additional work up for VTE is                      required. <> Testing on Inpatients and Evaluation of Disseminated Intravascular   Coagulation ([facility]) Reference Range:   0-499 ng/ml (FEU) Performed at [facility], [address] [place], [place] [address]     US Renal  Result Date: 07/25/2018 CLINICAL DATA:  Acute kidney injury, acute renal failure EXAM: RENAL / URINARY TRACT ULTRASOUND COMPLETE COMPARISON:  None FINDINGS: Right Kidney: Length: 10.9 cm. Cortical thinning. Increased cortical echogenicity. No  mass, hydronephrosis or shadowing calcification. Left  Kidney: Length: 10.5 cm. Marked cortical thinning. Increased cortical echogenicity. No gross evidence of mass or hydronephrosis, less well visualized than RIGHT kidney due to body habitus and poor acoustic window Bladder: Decompressed by Foley catheter, unable to evaluate IMPRESSION: BILATERAL renal cortical atrophy and medical renal disease changes. No gross renal mass or hydronephrosis identified. Electronically Signed   By: Lavonia Dana M.D.   On: 07/25/2018 12:51   Dg Chest Port 1 View  Result Date: 07/26/2018 CLINICAL DATA:  Right chest tube EXAM: PORTABLE CHEST 1 VIEW COMPARISON:  07/25/2018 FINDINGS: Right chest tubes remain in place. No pneumothorax. Continued right pleural effusion, patchy opacities throughout the right lung and left base atelectasis. Heart is normal size. IMPRESSION: No significant change since prior study.  No pneumothorax. Electronically Signed   By: Rolm Baptise M.D.   On: 07/26/2018 07:23   Dg Chest Port 1 View  Result Date: 07/25/2018 CLINICAL DATA:  Chest tube in place EXAM: PORTABLE CHEST 1 VIEW COMPARISON:  07/24/2018 FINDINGS: Three left chest tubes remain in place, unchanged. Moderate right pleural effusion. No pneumothorax. Right central line is unchanged. Patchy right lung and left basilar airspace opacities are stable. IMPRESSION: Stable right chest tubes without pneumothorax and right pleural effusion. Right lung and left basilar airspace opacities are stable. Electronically Signed   By: Rolm Baptise M.D.   On: 07/25/2018 07:40   Dg Abd Portable 2v  Result Date: 07/25/2018 CLINICAL DATA:  Abdominal distention and pain EXAM: PORTABLE ABDOMEN - 2 VIEW COMPARISON:  None FINDINGS: Moderate to large stool burden throughout the colon with mild gaseous distention of the colon which may reflect ileus. No evidence of bowel obstruction. No free air or organomegaly. Right chest tubes partially imaged. IVC filter in place. IMPRESSION: Moderate to large stool burden with mild  gaseous distention of the colon. This may reflect ileus. Electronically Signed   By: Rolm Baptise M.D.   On: 07/25/2018 07:19    ROS  See HPI for pertinent review of systems  Blood pressure (!) 95/44, pulse (!) 108, temperature 97.6 F (36.4 C), resp. rate (!) 24, height '6\' 1"'$  (1.854 m), weight 130.8 kg, SpO2 97 %.   Physical Exam  Vital signs: Please see the above listed vital signs HEENT: Trachea is midline, right IJ central line, no oral lesions appreciated, trachea is midline Cardiovascular: Regular rate and rhythm, tachycardia Pulmonary: Diminished breath sounds on the right, chest tubes noted, no clear air leak noted Abdominal: Positive bowel sounds, soft exam Extremities: Lower extremity edema appreciated bilaterally Neurologic: Cranial nerves are grossly intact without any focal deficits appreciated  Assessment/Plan:  Patient status post thoracotomy, decortication, evacuation of hematoma and chest tube placement.  Doing well from pulmonary point of view  Patient with acute renal failure, hyperkalemia, requiring hemodialysis.  Being moved to the intensive care unit for marginal blood pressures, tachycardia, hyperkalemia, may need pressor support during hemodialysis.  Will place dialysis catheter  Leukocytosis there is no clear evidence of infection  Anemia.  No active bleeding appreciated  Critical care time 35 minutes  Verdon Ferrante 07/26/2018, 11:56 AM

## 2018-07-26 NOTE — Progress Notes (Deleted)
[facility]     Assessment and Plan:  Pleural effusion. - Patient has a new onset loculated right-sided pleural effusion of uncertain etiology.  There appears to be associated right upper lobe atelectasis.  Given these new findings he is at high risk for further decline. -We will need a thoracentesis, patient has been set up to undergo CT chest in 1 week, will perform a thoracentesis before the CT scan.  ADDENDUM: Thoracentesis was suggestive of loculated empyema vs. Hemothorax. Will need CT chest.   Dyspnea. -Likely secondary to above with addition to chronic comorbidities. -Patient asked to start using his rescue inhaler as needed for dyspnea.  Pulmonary hypertension. - Patient has a history of multiple pulmonary emboli in the past, review of previous CT chest at the time shows dilated pulmonary artery suggestive of pulmonary hypertension. - We will continue to follow, may need further work-up once acute issues are resolved.  Obstructive sleep apnea. -Previous diagnosis of obstructive sleep apnea. - Patient apparently never received CPAP, will work to see if we can get him on CPAP.  No orders of the defined types were placed in this encounter.  No follow-ups on file.   Date: 07/26/2018  MRN# [id number] Jeff Leonard Oct 02, 1951    Jeff Leonard is a 67 y.o. old male seen in consultation for chief complaint of:    No chief complaint on file.   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 He was last seen with a loculated pleural effusion. He underwent attempt thoracentesis on 9/25 only a small amount of blood could be drained. He underwent a CT chest which showed possible hemothorax, and was referred for a thoracotomy on 9/28 after undergoing an IVC filter the day before.   **CXR 07/08/18; imaging personally Large loculated right pleural effusion vs. With atelectasis  of RUL.  Chest x-ray 12/30/2017>> imaging personally reviewed, slight right middle lobe infiltrate/adenopathy.  Otherwise lungs are unremarkable. **CT chest 04/01/2017>> image personally reviewed, moderate right pleural effusion with right basilar atelectasis.  Some of these changes are likely related to right pulmonary infarction related to concurrent right pulmonary embolism. Sleep study cpap titration 07/23/17>> Recommended auto-PAP 7-10 cm Sleep study 05/29/17>> AHI of 21, recommended sleep study.   Medication:   No current facility-administered medications for this visit.  No current outpatient medications on file.  Facility-Administered Medications Ordered in Other Visits:  .  0.9 %  sodium chloride infusion, , Intravenous, Continuous, Arnaldo Natal, MD, Last Rate: 150 mL/hr at 07/26/18 0813 .  0.9 %  sodium chloride infusion, 100 mL, Intravenous, PRN, Lateef, Munsoor, MD .  0.9 %  sodium chloride infusion, 100 mL, Intravenous, PRN, Lateef, Munsoor, MD .  acetaminophen (TYLENOL) tablet 1,000 mg, 1,000 mg, Oral, Q6H, Pabon, Diego F, MD, 1,000 mg at 07/25/18 1510 .  albuterol (PROVENTIL) (2.5 MG/3ML) 0.083% nebulizer solution 2.5 mg, 2.5 mg, Nebulization, Q6H PRN, [name], MD .  alteplase (CATHFLO ACTIVASE) injection 2 mg, 2 mg, Intracatheter, Once PRN, Lateef, Munsoor, MD .  bisacodyl (DULCOLAX) EC tablet 10 mg, 10 mg, Oral, Daily, [name], MD, 10 mg at 07/26/18 0807 .  [START ON 07/27/2018] Chlorhexidine Gluconate Cloth 2 % PADS 6 each, 6 each, Topical, Q0600, Lateef, Munsoor, MD .  cyclobenzaprine (FLEXERIL) tablet 5 mg, 5 mg, Oral, BID PRN, Gouru, Aruna, MD .  dorzolamide (TRUSOPT) 2 % ophthalmic solution 1 drop, 1 drop,  Both Eyes, Daily, [name], MD, 1 drop at 07/26/18 0810 .  gabapentin (NEURONTIN) tablet 600 mg, 600 mg, Oral, TID, Pabon, Diego F, MD, 600 mg at 07/26/18 0807 .  heparin injection 1,000 Units, 1,000 Units, Dialysis, PRN, Lateef, Munsoor, MD .  heparin  injection 1,000-6,000 Units, 1,000-6,000 Units, CRRT, PRN, Lateef, Munsoor, MD .  heparin lock flush 100 unit/mL, 500 Units, Intravenous, Once, Conforti, John, DO .  latanoprost (XALATAN) 0.005 % ophthalmic solution 1 drop, 1 drop, Both Eyes, QHS, Oaks, Marcial Pacas, MD, 1 drop at 07/25/18 2107 .  lidocaine (PF) (XYLOCAINE) 1 % injection 5 mL, 5 mL, Intradermal, PRN, Lateef, Munsoor, MD .  lidocaine-prilocaine (EMLA) cream 1 application, 1 application, Topical, PRN, Lateef, Munsoor, MD .  metoCLOPramide (REGLAN) injection 5 mg, 5 mg, Intravenous, Q8H PRN, Oralia Manis, MD, 5 mg at 07/26/18 1209 .  morphine 2 MG/ML injection 1-2 mg, 1-2 mg, Intravenous, Q1H PRN, Enedina Finner, MD, 2 mg at 07/26/18 1135 .  norepinephrine (LEVOPHED) 4mg  in D5W premix infusion, 0-40 mcg/min, Intravenous, Titrated, Conforti, John, DO, Last Rate: 18.75 mL/hr at 07/26/18 1832, 5 mcg/min at 07/26/18 1832 .  ondansetron (ZOFRAN) injection 4 mg, 4 mg, Intravenous, Q6H PRN, [name], MD, 4 mg at 07/26/18 0241 .  ondansetron (ZOFRAN) injection 4 mg, 4 mg, Intravenous, Q6H PRN, Conforti, John, DO, 4 mg at 07/26/18 1400 .  oxyCODONE-acetaminophen (PERCOCET/ROXICET) 5-325 MG per tablet 1-2 tablet, 1-2 tablet, Oral, Q4H PRN, [name], MD, 2 tablet at 07/25/18 2107 .  pentafluoroprop-tetrafluoroeth (GEBAUERS) aerosol 1 application, 1 application, Topical, PRN, Lateef, Munsoor, MD .  pureflow IV solution for Dialysis, , CRRT, Continuous, Lateef, Munsoor, MD .  sodium chloride flush (NS) 0.9 % injection 10-40 mL, 10-40 mL, Intracatheter, Q12H, Enedina Finner, MD, 10 mL at 07/26/18 0811 .  sodium chloride flush (NS) 0.9 % injection 10-40 mL, 10-40 mL, Intracatheter, PRN, Enedina Finner, MD .  timolol (TIMOPTIC) 0.5 % ophthalmic solution 1 drop, 1 drop, Both Eyes, Daily, [name], MD, 1 drop at 07/26/18 0810 .  traMADol (ULTRAM) tablet 50 mg, 50 mg, Oral, Q6H, [name], MD, 50 mg at 07/26/18 1846   Allergies:  Patient  has no known allergies.      LABORATORY PANEL:   CBC Recent Labs  Lab 07/25/18 0605  WBC 16.9*  HGB 9.9*  HCT 31.3*  PLT 223   ------------------------------------------------------------------------------------------------------------------  Chemistries  Recent Labs  Lab 07/26/18 0424  07/26/18 1739  NA 132*   < > 135  K 5.9*   < > 4.5  CL 99   < > 100  CO2 18*   < > 22  GLUCOSE 142*   < > 144*  BUN 57*   < > 48*  CREATININE 5.45*   < > 4.27*  CALCIUM 8.2*   < > 8.1*  MG  --    < > 1.7  AST 33  --   --   ALT 21  --   --   ALKPHOS 50  --   --   BILITOT 1.1  --   --    < > = values in this interval not displayed.   ------------------------------------------------------------------------------------------------------------------  Cardiac Enzymes No results for input(s): TROPONINI in the last 168 hours. ------------------------------------------------------------  RADIOLOGY:  US Renal  Result Date: 07/25/2018 CLINICAL DATA:  Acute kidney injury, acute renal failure EXAM: RENAL / URINARY TRACT ULTRASOUND COMPLETE COMPARISON:  None FINDINGS: Right Kidney: Length: 10.9 cm. Cortical thinning. Increased cortical echogenicity. No  mass, hydronephrosis or shadowing calcification. Left Kidney: Length: 10.5 cm. Marked cortical thinning. Increased cortical echogenicity. No gross evidence of mass or hydronephrosis, less well visualized than RIGHT kidney due to body habitus and poor acoustic window Bladder: Decompressed by Foley catheter, unable to evaluate IMPRESSION: BILATERAL renal cortical atrophy and medical renal disease changes. No gross renal mass or hydronephrosis identified. Electronically Signed   By: Ulyses Southward M.D.   On: 07/25/2018 12:51   Dg Chest Port 1 View  Result Date: 07/26/2018 CLINICAL DATA:  Central line placement, history MI EXAM: PORTABLE CHEST 1 VIEW COMPARISON:  Portable exam 1459 hours compared to 07/26/2018 at 0637 hours FINDINGS: Three RIGHT  thoracostomy tubes unchanged. New large bore RIGHT jugular central venous catheter with tip projecting over RIGHT atrium. No pneumothorax. Stable heart size and mediastinal contours. RIGHT basilar atelectasis with persistent RIGHT pleural effusion. Minimal LEFT basilar atelectasis. IMPRESSION: No pneumothorax following RIGHT jugular line placement. Multiple RIGHT thoracostomy tubes with small RIGHT pleural effusion and RIGHT basilar atelectasis. Electronically Signed   By: Ulyses Southward M.D.   On: 07/26/2018 15:12   Dg Chest Port 1 View  Result Date: 07/26/2018 CLINICAL DATA:  Right chest tube EXAM: PORTABLE CHEST 1 VIEW COMPARISON:  07/25/2018 FINDINGS: Right chest tubes remain in place. No pneumothorax. Continued right pleural effusion, patchy opacities throughout the right lung and left base atelectasis. Heart is normal size. IMPRESSION: No significant change since prior study.  No pneumothorax. Electronically Signed   By: Charlett Nose M.D.   On: 07/26/2018 07:23   Dg Chest Port 1 View  Result Date: 07/25/2018 CLINICAL DATA:  Chest tube in place EXAM: PORTABLE CHEST 1 VIEW COMPARISON:  07/24/2018 FINDINGS: Three left chest tubes remain in place, unchanged. Moderate right pleural effusion. No pneumothorax. Right central line is unchanged. Patchy right lung and left basilar airspace opacities are stable. IMPRESSION: Stable right chest tubes without pneumothorax and right pleural effusion. Right lung and left basilar airspace opacities are stable. Electronically Signed   By: Charlett Nose M.D.   On: 07/25/2018 07:40   Dg Abd Portable 2v  Result Date: 07/25/2018 CLINICAL DATA:  Abdominal distention and pain EXAM: PORTABLE ABDOMEN - 2 VIEW COMPARISON:  None FINDINGS: Moderate to large stool burden throughout the colon with mild gaseous distention of the colon which may reflect ileus. No evidence of bowel obstruction. No free air or organomegaly. Right chest tubes partially imaged. IVC filter in place.  IMPRESSION: Moderate to large stool burden with mild gaseous distention of the colon. This may reflect ileus. Electronically Signed   By: Charlett Nose M.D.   On: 07/25/2018 07:19       Thank  you for the consultation and for allowing [facility] to assist in the care of your patient. Our recommendations are noted above.  Please contact us if we can be of further service.   Wells Guiles, M.D., F.C.C.P.  Board Certified in Internal Medicine, Pulmonary Medicine, Critical Care Medicine, and Sleep Medicine.  [facility] Office Number: [phone]   07/26/2018

## 2018-07-26 NOTE — Progress Notes (Signed)
Notified Dr. Amado Coe pt's potassium was 5.9 this morning and increase renal function. Per MD okay for RN to place nephrology consult, stat EKG and also give one dose of kayexalate.

## 2018-07-26 NOTE — Consult Note (Signed)
[facility] CONSULT NOTE    Date: 07/26/2018                  Patient Name:  Jeff Leonard  MRN: [id number]  DOB: 08-Sep-1951  Age / Sex: 67 y.o., male         PCP: Casilda Carls, MD                 Service Requesting Consult: Hospitalist                 Reason for Consult: Acute renal failure, hyperkalemia            History of Present Illness: Patient is a 67 y.o. male with a PMHx of right-sided hydropneumothorax with history of right thoracotomy and decortication of visceral and parietal pleura, history of PE, obstructive sleep apnea, coronary artery disease, glaucoma, who was admitted to [facility] on 07/08/2018 for evaluation and treatment of right-sided hydropneumothorax.  Earlier in the admission he underwent decortication by Dr. Genevive Bi.  We are asked to see him now for worsening acute renal failure.  He has normal baseline renal function.  Over the past several days he has had rather poor p.o. intake and hypotension.  Renal function significantly worse today with a BUN of 57 and creatinine of 5.4 and EGFR down to 11.  Ultrasound was performed and shows bilateral renal cortical thinning but no hydronephrosis.  We consulted with vascular surgery given the fact that he has hyperkalemia and oliguria.  We plan for initiation of hemodialysis.  This was discussed with the patient's wife in depth.   Medications: Outpatient medications: Medications Prior to Admission  Medication Sig Dispense Refill Last Dose  . albuterol (PROVENTIL HFA;VENTOLIN HFA) 108 (90 Base) MCG/ACT inhaler Inhale 2 puffs into the lungs every 6 (six) hours as needed for wheezing or shortness of breath. 1 Inhaler 0 Unknown at PRN  . cetirizine (ZYRTEC) 10 MG tablet Take 10 mg by mouth daily.   Taking  . clomiPHENE (CLOMID) 50 MG tablet One half tab daily (Patient taking differently: Take 25 mg by mouth daily. ) 15 tablet 1 Taking  . cyclobenzaprine (FLEXERIL) 5 MG tablet Take 5 mg by mouth at bedtime as  needed.  0 Unknown at PRN  . dorzolamide (TRUSOPT) 2 % ophthalmic solution Place 1 drop into both eyes daily.     Marland Kitchen latanoprost (XALATAN) 0.005 % ophthalmic solution Place 1 drop into both eyes at bedtime.   6 Taking  . lisinopril (PRINIVIL,ZESTRIL) 5 MG tablet Take 5 mg by mouth daily.    Taking  . timolol (TIMOPTIC) 0.5 % ophthalmic solution Place 1 drop into both eyes daily.   6 Taking  . Vitamin D, Ergocalciferol, (DRISDOL) 50000 units CAPS capsule Take 50,000 Units by mouth every 7 (seven) days.   0 Taking  . XARELTO 20 MG TABS tablet Take 20 mg by mouth every evening.    Taking    Current medications: Current Facility-Administered Medications  Medication Dose Route Frequency Provider Last Rate Last Dose  . 0.9 %  sodium chloride infusion   Intravenous Continuous Harrie Foreman, MD 150 mL/hr at 07/26/18 0813    . acetaminophen (TYLENOL) tablet 1,000 mg  1,000 mg Oral Q6H Pabon, Diego F, MD   1,000 mg at 07/25/18 1510  . albuterol (PROVENTIL) (2.5 MG/3ML) 0.083% nebulizer solution 2.5 mg  2.5 mg Nebulization TID Nestor Lewandowsky, MD   2.5 mg at 07/26/18 0736  . bisacodyl (DULCOLAX) EC tablet  10 mg  10 mg Oral Daily Nestor Lewandowsky, MD   10 mg at 07/26/18 5465  . cyclobenzaprine (FLEXERIL) tablet 5 mg  5 mg Oral BID PRN Gouru, Aruna, MD      . dorzolamide (TRUSOPT) 2 % ophthalmic solution 1 drop  1 drop Both Eyes Daily Nestor Lewandowsky, MD   1 drop at 07/26/18 0810  . gabapentin (NEURONTIN) tablet 600 mg  600 mg Oral TID Caroleen Hamman F, MD   600 mg at 07/26/18 0807  . latanoprost (XALATAN) 0.005 % ophthalmic solution 1 drop  1 drop Both Eyes QHS Nestor Lewandowsky, MD   1 drop at 07/25/18 2107  . metoCLOPramide (REGLAN) injection 5 mg  5 mg Intravenous Q8H PRN Lance Coon, MD   5 mg at 07/26/18 0116  . morphine 2 MG/ML injection 1-2 mg  1-2 mg Intravenous Q1H PRN Fritzi Mandes, MD   2 mg at 07/26/18 1135  . ondansetron (ZOFRAN) injection 4 mg  4 mg Intravenous Q6H PRN Nestor Lewandowsky, MD   4 mg at  07/26/18 0241  . oxyCODONE-acetaminophen (PERCOCET/ROXICET) 5-325 MG per tablet 1-2 tablet  1-2 tablet Oral Q4H PRN Nestor Lewandowsky, MD   2 tablet at 07/25/18 2107  . sodium chloride flush (NS) 0.9 % injection 10-40 mL  10-40 mL Intracatheter Q12H Fritzi Mandes, MD   10 mL at 07/26/18 0811  . sodium chloride flush (NS) 0.9 % injection 10-40 mL  10-40 mL Intracatheter PRN Fritzi Mandes, MD      . timolol (TIMOPTIC) 0.5 % ophthalmic solution 1 drop  1 drop Both Eyes Daily Nestor Lewandowsky, MD   1 drop at 07/26/18 0810  . traMADol (ULTRAM) tablet 50 mg  50 mg Oral Q6H Nestor Lewandowsky, MD   50 mg at 07/25/18 1645      Allergies: No Known Allergies    Past Medical History: Past Medical History:  Diagnosis Date  . Glaucoma   . Hormone disorder   . Hx of pulmonary embolus   . Myocardial infarction ([place])   . Past heart attack   . Sleep apnea      Past Surgical History: Past Surgical History:  Procedure Laterality Date  . FIBEROPTIC BRONCHOSCOPY Right 08/06/2018   Procedure: BEDSIDE BRONCHOSCOPY FIBEROPTIC;  Surgeon: Nestor Lewandowsky, MD;  Location: [facility] ORS;  Service: Thoracic;  Laterality: Right;  . IVC FILTER INSERTION N/A 07/04/2018   Procedure: IVC FILTER INSERTION;  Surgeon: Katha Cabal, MD;  Location: [place] [facility];  Service: Cardiovascular;  Laterality: N/A;  . SHOULDER SURGERY     left shoulder   . THORACOTOMY Right 08/05/2018   Procedure: THORACOTOMY DECORTICATION;  Surgeon: Nestor Lewandowsky, MD;  Location: [facility] ORS;  Service: Thoracic;  Laterality: Right;     Family History: Family History  Problem Relation Age of Onset  . Heart disease Father 47  . Hypertension Mother   . Cancer Mother        Breast cancer   . Asthma Maternal Grandfather   . Cancer Paternal Uncle        lung     Social History: Social History   Socioeconomic History  . Marital status: Married    Spouse name: Not on file  . Number of children: Not on file  . Years of education: Not on file  .  Highest education level: Not on file  Occupational History  . Occupation: retired  Scientific laboratory technician  . Financial resource strain: Not on file  . Food insecurity:  Worry: Not on file    Inability: Not on file  . Transportation needs:    Medical: Not on file    Non-medical: Not on file  Tobacco Use  . Smoking status: Never Smoker  . Smokeless tobacco: Never Used  Substance and Sexual Activity  . Alcohol use: No  . Drug use: No  . Sexual activity: Yes  Lifestyle  . Physical activity:    Days per week: Not on file    Minutes per session: Not on file  . Stress: Not on file  Relationships  . Social connections:    Talks on phone: Not on file    Gets together: Not on file    Attends religious service: Not on file    Active member of club or organization: Not on file    Attends meetings of clubs or organizations: Not on file    Relationship status: Not on file  . Intimate partner violence:    Fear of current or ex partner: Not on file    Emotionally abused: Not on file    Physically abused: Not on file    Forced sexual activity: Not on file  Other Topics Concern  . Not on file  Social History Narrative  . Not on file     Review of Systems: Patient unable to provide at this time  Vital Signs: Blood pressure (!) 77/54, pulse (!) 103, temperature 97.7 F (36.5 C), temperature source Oral, resp. rate 18, height _0  (1.854 m), weight 130.8 kg, SpO2 98 %.  Weight trends: Filed Weights   07/20/18 0411 07/21/18 0500 08/13/2018 1700  Weight: 126.9 kg 127.1 kg 130.8 kg    Physical Exam: General: Critically ill appearing  Head: Normocephalic, atraumatic.  Eyes: Anicteric, EOMI  Nose: Mucous membranes moist, not inflammed, nonerythematous.  Throat: Oropharynx nonerythematous, no exudate appreciated.   Neck: Supple, trachea midline.  Lungs:  Scattered rhonchi, slightly increased work of breathing  Heart: RRR. S1 and S2 normal without gallop, murmur, or rubs.  Abdomen:  BS  normoactive. Soft, Nondistended, non-tender.  No masses or organomegaly.  Extremities: 2+ pretibial edema.  Neurologic: Lethargic but arousable  Skin: No visible rashes, scars.    Lab results: Basic Metabolic Panel: Recent Labs  Lab 07/23/18 0325 07/25/18 0605 07/26/18 0424  NA 137 133* 132*  K 5.0 5.0 5.9*  CL 105 97* 99  CO2 26 23 18*  GLUCOSE 154* 195* 142*  BUN 11 35* 57*  CREATININE 0.98 3.50* 5.45*  CALCIUM 8.6* 9.1 8.2*    Liver Function Tests: Recent Labs  Lab 07/21/18 2118 07/25/18 0605 07/26/18 0424  AST 20 28 33  ALT _1 ALKPHOS 37* 42 50  BILITOT 0.5 1.6* 1.1  PROT 7.2 7.9 7.4  ALBUMIN 3.2* 3.3* 3.2*   No results for input(s): LIPASE, AMYLASE in the last 168 hours. No results for input(s): AMMONIA in the last 168 hours.  CBC: Recent Labs  Lab 08/07/2018 0457 08/20/2018 1638 07/23/18 0325 07/24/18 0403 07/25/18 0605  WBC 8.6 13.9* 14.3* 14.0* 16.9*  HGB 11.6* 11.9* 11.4* 10.9* 9.9*  HCT 33.7* 35.0* 33.9* 32.1* 31.3*  MCV 93.8 95.1 93.7 93.9 95.6  PLT 215 209 226 204 223    Cardiac Enzymes: No results for input(s): CKTOTAL, CKMB, CKMBINDEX, TROPONINI in the last 168 hours.  BNP: Invalid input(s): POCBNP  CBG: Recent Labs  Lab 08/14/2018 1610 07/25/18 0547  GLUCAP 152* 171*    Microbiology: Results for orders placed or performed  during the hospital encounter of 07/02/2018  Aerobic/Anaerobic Culture (surgical/deep wound)     Status: None   Collection Time: 07/01/2018 11:30 AM  Result Value Ref Range Status   Specimen Description   Final    WOUND PLEURAL RIGHT Performed at [facility], [place] [address] [place], [place] [address]    Special Requests   Final    Normal Performed at [facility], [address] [place], [place] [address]    Gram Stain   Final    RARE WBC PRESENT, PREDOMINANTLY MONONUCLEAR NO ORGANISMS SEEN    Culture   Final    No growth aerobically or anaerobically. Performed at [facility], [place] [address], [place], [place] [address]    Report Status 07/23/2018 FINAL  Final  Fungus Culture With Stain     Status: None (Preliminary result)   Collection Time: 08/16/2018 10:15 AM  Result Value Ref Range Status   Fungus Stain Final report  Final    Comment: (NOTE) Performed At: [facility] [place], [place] [id number] Rush Farmer MD [id number]    Fungus (Mycology) Culture PENDING  Incomplete   Fungal Source PLEURAL  Final    Comment: Performed at [facility], [address] [place], [place] [address]  Aerobic/Anaerobic Culture (surgical/deep wound)     Status: None (Preliminary result)   Collection Time: 08/15/2018 10:15 AM  Result Value Ref Range Status   Specimen Description   Final    PLEURAL Performed at [facility], [address], [place], [place] [address]    Special Requests   Final    PLEURAL Performed at [facility], [address] [place], [place] [address]    Gram Stain   Final    FEW WBC PRESENT,BOTH PMN AND MONONUCLEAR NO ORGANISMS SEEN    Culture   Final    NO GROWTH 4 DAYS NO ANAEROBES ISOLATED; CULTURE IN PROGRESS FOR 5 DAYS Performed at [place] [address], [place], [place] [address]    Report Status PENDING  Incomplete  Acid Fast Smear (AFB)     Status: None   Collection Time: 08/15/2018 10:15 AM  Result Value Ref Range Status   AFB Specimen Processing Concentration  Final   Acid Fast Smear Negative  Final    Comment: (NOTE) Performed At: [facility] [address] [place], [place] [id number] Rush Farmer MD [id number]    Source (AFB) PLEURAL  Final    Comment: Performed at [facility], [address] [place], [place] [address]  Fungus Culture Result     Status: None   Collection Time: 08/06/2018 10:15 AM  Result Value Ref Range Status   Result 1 Comment  Final    Comment: (NOTE) KOH/Calcofluor preparation:  no fungus  observed. Performed At: [facility] [place], [place] [id number] Rush Farmer MD [id number]   MRSA PCR Screening     Status: None   Collection Time: 08/17/2018  4:38 PM  Result Value Ref Range Status   MRSA by PCR NEGATIVE NEGATIVE Final    Comment:        The GeneXpert MRSA Assay (FDA approved for NASAL specimens only), is one component of a comprehensive MRSA colonization surveillance program. It is not intended to diagnose MRSA infection nor to guide or monitor treatment for MRSA infections. Performed at [facility], [address] [place],  [address]     Coagulation Studies:  No results for input(s): LABPROT, INR in the last 72 hours.  Urinalysis: No results for input(s): COLORURINE, LABSPEC, PHURINE, GLUCOSEU, HGBUR, BILIRUBINUR, KETONESUR, PROTEINUR, UROBILINOGEN, NITRITE, LEUKOCYTESUR in the last 72 hours.  Invalid input(s): APPERANCEUR    Imaging: US Renal  Result Date: 07/25/2018 CLINICAL DATA:  Acute kidney injury, acute renal failure EXAM: RENAL / URINARY TRACT ULTRASOUND COMPLETE COMPARISON:  None FINDINGS: Right Kidney: Length: 10.9 cm. Cortical thinning. Increased cortical echogenicity. No mass, hydronephrosis or shadowing calcification. Left Kidney: Length: 10.5 cm. Marked cortical thinning. Increased cortical echogenicity. No gross evidence of mass or hydronephrosis, less well visualized than RIGHT kidney due to body habitus and poor acoustic window Bladder: Decompressed by Foley catheter, unable to evaluate IMPRESSION: BILATERAL renal cortical atrophy and medical renal disease changes. No gross renal mass or hydronephrosis identified. Electronically Signed   By: Lavonia Dana M.D.   On: 07/25/2018 12:51   Dg Chest Port 1 View  Result Date: 07/26/2018 CLINICAL DATA:  Right chest tube EXAM: PORTABLE CHEST 1 VIEW COMPARISON:  07/25/2018 FINDINGS: Right chest tubes remain in place. No pneumothorax. Continued right pleural  effusion, patchy opacities throughout the right lung and left base atelectasis. Heart is normal size. IMPRESSION: No significant change since prior study.  No pneumothorax. Electronically Signed   By: Rolm Baptise M.D.   On: 07/26/2018 07:23   Dg Chest Port 1 View  Result Date: 07/25/2018 CLINICAL DATA:  Chest tube in place EXAM: PORTABLE CHEST 1 VIEW COMPARISON:  07/24/2018 FINDINGS: Three left chest tubes remain in place, unchanged. Moderate right pleural effusion. No pneumothorax. Right central line is unchanged. Patchy right lung and left basilar airspace opacities are stable. IMPRESSION: Stable right chest tubes without pneumothorax and right pleural effusion. Right lung and left basilar airspace opacities are stable. Electronically Signed   By: Rolm Baptise M.D.   On: 07/25/2018 07:40   Dg Abd Portable 2v  Result Date: 07/25/2018 CLINICAL DATA:  Abdominal distention and pain EXAM: PORTABLE ABDOMEN - 2 VIEW COMPARISON:  None FINDINGS: Moderate to large stool burden throughout the colon with mild gaseous distention of the colon which may reflect ileus. No evidence of bowel obstruction. No free air or organomegaly. Right chest tubes partially imaged. IVC filter in place. IMPRESSION: Moderate to large stool burden with mild gaseous distention of the colon. This may reflect ileus. Electronically Signed   By: Rolm Baptise M.D.   On: 07/25/2018 07:19      Assessment & Plan: Pt is a 67 y.o. male  with a PMHx of right-sided hydropneumothorax with history of right thoracotomy and decortication of visceral and parietal pleura, history of PE, obstructive sleep apnea, coronary artery disease, glaucoma, who was admitted to [facility] on 07/01/2018 for evaluation and treatment of right-sided hydropneumothorax.  1.  Acute renal failure suspect secondary to acute tubular necrosis from prolonged hypotension. 2.  Hyperkalemia, potassium 5.9. 3.  Hypotension. 4.  Hyponatremia.  Plan: We are asked to see the patient  for evaluation management of acute renal failure.  Suspect that his acute renal failure secondary to acute tubular necrosis from prolonged hypotension.  Renal ultrasound was negative for hydronephrosis.  Given hyperkalemia and oliguria we we will place the patient on continuous renal placement therapy with blood flow rate of 300, dialysate flow rate 2.5 L/h, and 2K potassium bath.  No ultrafiltration at this time.  We will check additional work-up including SPEP, UPEP, ANA, ANCA antibodies, GBM antibodies, C3, C4.  Management of hydropneumothorax as per critical care  as well as thoracic surgery.  Thanks for consultation.

## 2018-07-26 NOTE — Progress Notes (Signed)
This note also relates to the following rows which could not be included: Resp - Cannot attach notes to unvalidated device data  Resumeing therapy as BP has improved with pressers, Dr Cherylann Ratel notified.

## 2018-07-26 NOTE — Progress Notes (Signed)
Dr Sheryle Hail notified of increased Bun and creatinine, low urine output 225cc's, potassium 5.9, orders given

## 2018-07-26 NOTE — Progress Notes (Signed)
Will suspend Hd treatment as bp is low, Dr. Cherylann Ratel notified.  Will resume if ICU staff can assist bp.

## 2018-07-26 NOTE — Progress Notes (Signed)
Hd competed  

## 2018-07-26 NOTE — Progress Notes (Signed)
S/p decortication Renal failure in need for HD Less 50cc CT no air leak CXR lungs up  PE CT in place, no infection, no airleak  A/P  Deteriorating renal fx From surgical perspective nothing acute CT to water seal We will not remove any CT this weekend given his deteriorating condition

## 2018-07-26 NOTE — Progress Notes (Signed)
Patient ID: Jeff Leonard, male   DOB: 09-11-1951, 67 y.o.   MRN: [id number] Pulmonary/critical care attending  Procedure note Dialysis catheter placement Performed under ultrasound guidance Consent is obtained First location chosen was right femoral vein.  Performed under ultrasound guidance The right femoral vein was dilated and noncompressible, was accessed on multiple past however unable to feed guidewire Site aborted and went to left internal jugular location. Again performed on ultrasound guidance, complete contact barrier precaution utilized Subdermal lidocaine given, the left internal jugular vein was extremely flat, small, closed on inhalation and compression, was sitting on top of the carotid, was able to access it multiple times but unable to pass guidewire Left femoral vein visualized again was dilated and noncompressible Discussed with vascular surgery who came in and assisted with right internal jugular approach, guidewire exchange with dialysis catheter placement.  Patient had a right internal jugular central line, difficult to thread guidewire but with multiple attempts, flushing vascular surgery was able to successfully pass guidewire in complete dialysis catheter placement please see her note for details on her aspect of procedure Stat portable chest x-ray ordered  Tora [name], DO

## 2018-07-26 NOTE — Progress Notes (Signed)
Pt transfer to icu room 9

## 2018-07-26 NOTE — Progress Notes (Signed)
[facility] - [place] [facility]   PATIENT NAME: Jeff Leonard    MR#:  [id number]  DATE OF BIRTH:  06-16-1951  SUBJECTIVE:  Patient's right leg pain significantly improved when denies any shortness of breath or chest pain but renal function has gotten worse  Has chest tubes placed in for right-sided loculated pleural effusion/pneumothorax.  Wife at bedside  REVIEW OF SYSTEMS:   Review of Systems  Constitutional: Negative for chills, fever and weight loss.  HENT: Negative for ear discharge, ear pain and nosebleeds.   Eyes: Negative for blurred vision, pain and discharge.  Respiratory: Negative for sputum production, shortness of breath, wheezing and stridor.   Cardiovascular: Positive for chest pain. Negative for palpitations, orthopnea and PND.  Gastrointestinal: Negative for abdominal pain, diarrhea, nausea and vomiting.  Genitourinary: Negative for frequency and urgency.  Musculoskeletal: Negative for back pain and joint pain.  Neurological: Negative for sensory change, speech change, focal weakness and weakness.  Psychiatric/Behavioral: Negative for depression and hallucinations. The patient is not nervous/anxious.    Tolerating Diet:yesTolerating PT: ambulatory  DRUG ALLERGIES:  No Known Allergies  VITALS:  Blood pressure (!) 95/44, pulse (!) 108, temperature 97.6 F (36.4 C), resp. rate (!) 24, height 6\' 1"  (1.854 m), weight 130.8 kg, SpO2 97 %.  PHYSICAL EXAMINATION:   Physical Exam  GENERAL:  67 y.o.-year-old patient lying in the bed with no acute distress. obese EYES: Pupils equal, round, reactive to light and accommodation. No scleral icterus. Extraocular muscles intact.  HEENT: Head atraumatic, normocephalic. Oropharynx and nasopharynx clear.  NECK:  Supple, no jugular venous distention. No thyroid enlargement, no tenderness.  LUNGS: Normal breath sounds bilaterally, no wheezing, rales, rhonchi. No use of accessory muscles of  respiration.  3Right sided CT + CARDIOVASCULAR: S1, S2 normal. No murmurs, rubs, or gallops.  ABDOMEN: Soft, nontender, nondistended. Bowel sounds present. No organomegaly or mass.  EXTREMITIES: No cyanosis, clubbing or edema b/l.    NEUROLOGIC: Cranial nerves II through XII are intact. No focal Motor or sensory deficits b/l.   PSYCHIATRIC:  patient is alert and oriented x 3.  SKIN: No obvious rash, lesion, or ulcer.   LABORATORY PANEL:  CBC Recent Labs  Lab 07/25/18 0605  WBC 16.9*  HGB 9.9*  HCT 31.3*  PLT 223    Chemistries  Recent Labs  Lab 07/26/18 0424  NA 132*  K 5.9*  CL 99  CO2 18*  GLUCOSE 142*  BUN 57*  CREATININE 5.45*  CALCIUM 8.2*  AST 33  ALT 21  ALKPHOS 50  BILITOT 1.1   Cardiac Enzymes No results for input(s): TROPONINI in the last 168 hours. RADIOLOGY:  US Renal  Result Date: 07/25/2018 CLINICAL DATA:  Acute kidney injury, acute renal failure EXAM: RENAL / URINARY TRACT ULTRASOUND COMPLETE COMPARISON:  None FINDINGS: Right Kidney: Length: 10.9 cm. Cortical thinning. Increased cortical echogenicity. No mass, hydronephrosis or shadowing calcification. Left Kidney: Length: 10.5 cm. Marked cortical thinning. Increased cortical echogenicity. No gross evidence of mass or hydronephrosis, less well visualized than RIGHT kidney due to body habitus and poor acoustic window Bladder: Decompressed by Foley catheter, unable to evaluate IMPRESSION: BILATERAL renal cortical atrophy and medical renal disease changes. No gross renal mass or hydronephrosis identified. Electronically Signed   By: Ulyses Southward M.D.   On: 07/25/2018 12:51   Dg Chest Port 1 View  Result Date: 07/26/2018 CLINICAL DATA:  Right chest tube EXAM: PORTABLE CHEST 1 VIEW COMPARISON:  07/25/2018 FINDINGS: Right chest tubes  remain in place. No pneumothorax. Continued right pleural effusion, patchy opacities throughout the right lung and left base atelectasis. Heart is normal size. IMPRESSION: No  significant change since prior study.  No pneumothorax. Electronically Signed   By: Charlett Nose M.D.   On: 07/26/2018 07:23   Dg Chest Port 1 View  Result Date: 07/25/2018 CLINICAL DATA:  Chest tube in place EXAM: PORTABLE CHEST 1 VIEW COMPARISON:  07/24/2018 FINDINGS: Three left chest tubes remain in place, unchanged. Moderate right pleural effusion. No pneumothorax. Right central line is unchanged. Patchy right lung and left basilar airspace opacities are stable. IMPRESSION: Stable right chest tubes without pneumothorax and right pleural effusion. Right lung and left basilar airspace opacities are stable. Electronically Signed   By: Charlett Nose M.D.   On: 07/25/2018 07:40   Dg Abd Portable 2v  Result Date: 07/25/2018 CLINICAL DATA:  Abdominal distention and pain EXAM: PORTABLE ABDOMEN - 2 VIEW COMPARISON:  None FINDINGS: Moderate to large stool burden throughout the colon with mild gaseous distention of the colon which may reflect ileus. No evidence of bowel obstruction. No free air or organomegaly. Right chest tubes partially imaged. IVC filter in place. IMPRESSION: Moderate to large stool burden with mild gaseous distention of the colon. This may reflect ileus. Electronically Signed   By: Charlett Nose M.D.   On: 07/25/2018 07:19   ASSESSMENT AND PLAN:   66 year old admitted with loculated pleural effusion on CT surgery service  *AKI-with hyperkalemia and worsening-rule out renal thrombus given the history of DVT and pulmonary embolism Vascular surgery consulted for vascular access [facility] consulted and discussed with Dr. Cherylann Ratel Hypercoag work-up EKG with no acute T wave changes Hydrate with IV fluids Renal ultrasound-bilateral renal cortical atrophy and medical renal disease change-renal vascular duplex study or CTAs recommended Avoid nephrotoxins and NSAIDs   *Right loculated pleural effusion-chronic -S/p right chest tube placement on July 17, 2018 by Dr.  Oakes/cardiothoracic surgery- -s/p pleurodesis/decortication on 08-19-18 -chest tube management per surgery, not considering chest tube removal today as patient was complaining of chest wall pain.  Repeat chest x-ray in a.m.  *Acute right leg pain with muscle cramp Pain meds as needed, muscle relaxants  *History of recurrent pulmonary embolism and DVT Vascular surgery consulted - S/p IVC filter placement -pt currently off xarelto. Had been on it for 4 years.  -Ultrasound bilateral LE negative.  As ultrasound negative  consider not restarting anticoagulation -will d/w PCP dr Dario Guardian on monday  *Hypertension  continue therapy with lisinopril  *Glaucoma  continue eyedrops  *DVT prophylaxis with SCDs.  Case discussed with Care Management/Social Worker. Management plans discussed with the patient, family and they are in agreement.  CODE STATUS: full  DVT Prophylaxis: SCD  TOTAL CRITICAL CARE TIME TAKING CARE OF THIS PATIENT: 41 minutes.  >50% time spent on counselling and coordination of care  POSSIBLE D/C IN *1-2* DAYS, DEPENDING ON CLINICAL CONDITION.  Note: This dictation was prepared with Dragon dictation along with smaller phrase technology. Any transcriptional errors that result from this process are unintentional.  Ramonita Lab M.D on 07/26/2018 at 11:23 AM  Between 7am to 6pm - Pager - [phone]  After 6pm go to [email or web address] - Social research officer, government  [facility]  Office  [phone]  CC: Primary care physician; Sherrie Mustache, MDPatient ID: Jeff Leonard, male   DOB: Sep 10, 1951, 67 y.o.   MRN: [id number]

## 2018-07-26 NOTE — Procedures (Addendum)
Hemodialysis Catheter Insertion Procedure Note Jeff Leonard [id number] June 25, 1951   Asked by Intensivist to assist in placing Right IJ Temporary  HD catheter over an indwelling triple lumen catheter. There was initial resistance in passing the guide wire through the indwelling Central line.  Procedure: Insertion of hemodialysis Catheter Indications: Hemodialysis ARF  Procedure Details Consent: Risks of procedure as well as the alternatives and risks of each were explained to the (patient/caregiver).  Consent for procedure obtained. Time Out: Verified patient identification, verified procedure, site/side was marked, verified correct patient position, special equipment/implants available, medications/allergies/relevent history reviewed, required imaging and test results available.  Performed  Maximum sterile technique was used including antiseptics, cap, gloves, gown, hand hygiene, mask and sheet. Skin prep: Chlorhexidine; local anesthetic administered A antimicrobial bonded/coated triple lumen hemodialysis catheter was placed in the right internal jugular vein using the Seldinger technique. The catheter was exchanged from a triple lumen central line.  Evaluation Blood flow good Complications: No apparent complications Patient did tolerate procedure well. Chest X-ray ordered to verify placement.  CXR: normal.  Jeff Leonard, Jeff Leonard A 07/26/2018, 3:00 PM

## 2018-07-26 NOTE — Progress Notes (Signed)
Worsening renal function and poor UOP. Concern is that patient is not simply intravascularly dry but that he is not perfusing his kidneys. Unclear why the patient would develop arterial clots at this point. Need vascular surgery input. Consult placed.

## 2018-07-26 NOTE — Progress Notes (Signed)
MEDICATION RELATED CONSULT NOTE - FOLLOW UP   Pharmacy Consult for Medication Review Indication: CRRT  No Known Allergies  Labs: Recent Labs    07/24/18 0403 07/25/18 0605 07/26/18 0424  WBC 14.0* 16.9*  --   HGB 10.9* 9.9*  --   HCT 32.1* 31.3*  --   PLT 204 223  --   CREATININE  --  3.50* 5.45*  ALBUMIN  --  3.3* 3.2*  PROT  --  7.9 7.4  AST  --  28 33  ALT  --  16 21  ALKPHOS  --  42 50  BILITOT  --  1.6* 1.1   Estimated Creatinine Clearance: 19 mL/min (A) (by C-G formula based on SCr of 5.45 mg/dL (H)).   Assessment: Patient is a 67yo male admitted for hemothorax. Has been started on CRRT, pharmacy consulted for daily medication review.  Plan:  No changes needed at this time. Will continue to follow.  Clovia Cuff, PharmD, BCPS 07/26/2018 2:16 PM

## 2018-07-27 ENCOUNTER — Inpatient Hospital Stay: Payer: Medicare PPO

## 2018-07-27 ENCOUNTER — Ambulatory Visit: Payer: Medicare PPO | Admitting: Internal Medicine

## 2018-07-27 LAB — RENAL FUNCTION PANEL
ALBUMIN: 2.9 g/dL — AB (ref 3.5–5.0)
ANION GAP: 16 — AB (ref 5–15)
ANION GAP: 25 — AB (ref 5–15)
Albumin: 2.6 g/dL — ABNORMAL LOW (ref 3.5–5.0)
Albumin: 2.6 g/dL — ABNORMAL LOW (ref 3.5–5.0)
Albumin: 2.8 g/dL — ABNORMAL LOW (ref 3.5–5.0)
Anion gap: 13 (ref 5–15)
Anion gap: 18 — ABNORMAL HIGH (ref 5–15)
BUN: 53 mg/dL — ABNORMAL HIGH (ref 8–23)
BUN: 54 mg/dL — AB (ref 8–23)
BUN: 54 mg/dL — AB (ref 8–23)
BUN: 63 mg/dL — AB (ref 8–23)
CALCIUM: 7.6 mg/dL — AB (ref 8.9–10.3)
CHLORIDE: 100 mmol/L (ref 98–111)
CHLORIDE: 100 mmol/L (ref 98–111)
CHLORIDE: 97 mmol/L — AB (ref 98–111)
CHLORIDE: 97 mmol/L — AB (ref 98–111)
CO2: 19 mmol/L — AB (ref 22–32)
CO2: 21 mmol/L — AB (ref 22–32)
CO2: 21 mmol/L — ABNORMAL LOW (ref 22–32)
CO2: 15 mmol/L — ABNORMAL LOW (ref 22–32)
CREATININE: 6.36 mg/dL — AB (ref 0.61–1.24)
Calcium: 7.7 mg/dL — ABNORMAL LOW (ref 8.9–10.3)
Calcium: 8.3 mg/dL — ABNORMAL LOW (ref 8.9–10.3)
Calcium: 8.5 mg/dL — ABNORMAL LOW (ref 8.9–10.3)
Creatinine, Ser: 5.63 mg/dL — ABNORMAL HIGH (ref 0.61–1.24)
Creatinine, Ser: 5.75 mg/dL — ABNORMAL HIGH (ref 0.61–1.24)
Creatinine, Ser: 6.1 mg/dL — ABNORMAL HIGH (ref 0.61–1.24)
GFR calc Af Amer: 9 mL/min — ABNORMAL LOW (ref >60)
GFR calc Af Amer: 10 mL/min — ABNORMAL LOW (ref >60)
GFR calc non Af Amer: 8 mL/min — ABNORMAL LOW (ref >60)
GFR calc non Af Amer: 9 mL/min — ABNORMAL LOW (ref >60)
GFR, EST AFRICAN AMERICAN: 11 mL/min — AB (ref >60)
GFR, EST AFRICAN AMERICAN: 11 mL/min — AB (ref >60)
GFR, EST NON AFRICAN AMERICAN: 9 mL/min — AB (ref >60)
GFR, EST NON AFRICAN AMERICAN: 9 mL/min — AB (ref >60)
GLUCOSE: 179 mg/dL — AB (ref 70–99)
GLUCOSE: 179 mg/dL — AB (ref 70–99)
Glucose, Bld: 101 mg/dL — ABNORMAL HIGH (ref 70–99)
Glucose, Bld: 145 mg/dL — ABNORMAL HIGH (ref 70–99)
POTASSIUM: 4.8 mmol/L (ref 3.5–5.1)
POTASSIUM: 4.7 mmol/L (ref 3.5–5.1)
POTASSIUM: 5.8 mmol/L — AB (ref 3.5–5.1)
Phosphorus: 10.2 mg/dL — ABNORMAL HIGH (ref 2.5–4.6)
Phosphorus: 6.8 mg/dL — ABNORMAL HIGH (ref 2.5–4.6)
Phosphorus: 6.9 mg/dL — ABNORMAL HIGH (ref 2.5–4.6)
Phosphorus: 7.5 mg/dL — ABNORMAL HIGH (ref 2.5–4.6)
Potassium: 4.9 mmol/L (ref 3.5–5.1)
SODIUM: 134 mmol/L — AB (ref 135–145)
Sodium: 134 mmol/L — ABNORMAL LOW (ref 135–145)
Sodium: 137 mmol/L (ref 135–145)
Sodium: 137 mmol/L (ref 135–145)

## 2018-07-27 LAB — MAGNESIUM
MAGNESIUM: 1.9 mg/dL (ref 1.7–2.4)
MAGNESIUM: 1.9 mg/dL (ref 1.7–2.4)
MAGNESIUM: 2.4 mg/dL (ref 1.7–2.4)
Magnesium: 1.7 mg/dL (ref 1.7–2.4)
Magnesium: 1.8 mg/dL (ref 1.7–2.4)
Magnesium: 2.6 mg/dL — ABNORMAL HIGH (ref 1.7–2.4)

## 2018-07-27 LAB — AEROBIC/ANAEROBIC CULTURE (SURGICAL/DEEP WOUND): CULTURE: NO GROWTH

## 2018-07-27 LAB — AEROBIC/ANAEROBIC CULTURE W GRAM STAIN (SURGICAL/DEEP WOUND)

## 2018-07-27 LAB — CBC
HCT: 26.3 % — ABNORMAL LOW (ref 40.0–52.0)
HEMOGLOBIN: 8.5 g/dL — AB (ref 13.0–18.0)
MCH: 30.4 pg (ref 26.0–34.0)
MCHC: 32.3 g/dL (ref 32.0–36.0)
MCV: 94.1 fL (ref 80.0–100.0)
Platelets: 126 10*3/uL — ABNORMAL LOW (ref 150–440)
RBC: 2.8 MIL/uL — ABNORMAL LOW (ref 4.40–5.90)
RDW: 13.9 % (ref 11.5–14.5)
WBC: 21.9 10*3/uL — ABNORMAL HIGH (ref 3.8–10.6)

## 2018-07-27 LAB — COMPREHENSIVE METABOLIC PANEL
ALK PHOS: 45 U/L (ref 38–126)
ALT: 17 U/L (ref 0–44)
ANION GAP: 15 (ref 5–15)
AST: 25 U/L (ref 15–41)
Albumin: 2.7 g/dL — ABNORMAL LOW (ref 3.5–5.0)
BILIRUBIN TOTAL: 1.2 mg/dL (ref 0.3–1.2)
BUN: 55 mg/dL — ABNORMAL HIGH (ref 8–23)
CO2: 21 mmol/L — ABNORMAL LOW (ref 22–32)
Calcium: 7.6 mg/dL — ABNORMAL LOW (ref 8.9–10.3)
Chloride: 99 mmol/L (ref 98–111)
Creatinine, Ser: 6.2 mg/dL — ABNORMAL HIGH (ref 0.61–1.24)
GFR, EST AFRICAN AMERICAN: 10 mL/min — AB (ref >60)
GFR, EST NON AFRICAN AMERICAN: 8 mL/min — AB (ref >60)
GLUCOSE: 155 mg/dL — AB (ref 70–99)
Potassium: 4.8 mmol/L (ref 3.5–5.1)
Sodium: 135 mmol/L (ref 135–145)
TOTAL PROTEIN: 7 g/dL (ref 6.5–8.1)

## 2018-07-27 LAB — TRIGLYCERIDES: Triglycerides: 224 mg/dL — ABNORMAL HIGH (ref <150)

## 2018-07-27 LAB — PROTIME-INR
INR: 1.67
PROTHROMBIN TIME: 19.6 s — AB (ref 11.4–15.2)

## 2018-07-27 LAB — APTT: aPTT: 56 seconds — ABNORMAL HIGH (ref 24–36)

## 2018-07-27 MED ORDER — FENTANYL CITRATE (PF) 100 MCG/2ML IJ SOLN: 50.0000 ug | Freq: Once | INTRAMUSCULAR | Status: DC

## 2018-07-27 MED ORDER — OCUVITE-LUTEIN PO CAPS
1.0000 | ORAL_CAPSULE | Freq: Every day | ORAL | Status: DC
  Administered 2018-07-27: 1 via ORAL
  Filled 2018-07-27 (×2): qty 1

## 2018-07-27 MED ORDER — NEPRO/CARBSTEADY PO LIQD: 237.0000 mL | Freq: Three times a day (TID) | ORAL | Status: DC

## 2018-07-27 MED ORDER — ACETAMINOPHEN 325 MG PO TABS: 650.0000 mg | ORAL_TABLET | Freq: Four times a day (QID) | ORAL | Status: DC | PRN

## 2018-07-27 MED ORDER — FENTANYL BOLUS VIA INFUSION: 25.0000 ug | INTRAVENOUS | Status: DC | PRN

## 2018-07-27 MED ORDER — ROCURONIUM BROMIDE 50 MG/5ML IV SOLN
INTRAVENOUS | Status: AC
  Administered 2018-07-27: 50 mg
  Filled 2018-07-27: qty 1

## 2018-07-27 MED ORDER — VITAMIN B-6 50 MG PO TABS
100.0000 mg | ORAL_TABLET | Freq: Every day | ORAL | Status: DC
  Administered 2018-07-27: 100 mg via ORAL
  Filled 2018-07-27 (×3): qty 2

## 2018-07-27 MED ORDER — FENTANYL CITRATE (PF) 100 MCG/2ML IJ SOLN
INTRAMUSCULAR | Status: AC
  Administered 2018-07-27: 100 ug
  Filled 2018-07-27: qty 2

## 2018-07-27 MED ORDER — STERILE WATER FOR INJECTION IJ SOLN
INTRAMUSCULAR | Status: AC
  Administered 2018-07-27: 10 mL
  Filled 2018-07-27: qty 10

## 2018-07-27 MED ORDER — VASOPRESSIN 20 UNIT/ML IV SOLN
0.0400 [IU]/min | INTRAVENOUS | Status: DC
  Administered 2018-07-27 – 2018-07-28 (×2): 0.04 [IU]/min via INTRAVENOUS
  Filled 2018-07-27 (×3): qty 2

## 2018-07-27 MED ORDER — HEPARIN SODIUM (PORCINE) 1000 UNIT/ML DIALYSIS
1000.0000 [IU] | INTRAMUSCULAR | Status: DC | PRN
  Administered 2018-07-27: 2800 [IU] via INTRAVENOUS_CENTRAL
  Filled 2018-07-27 (×3): qty 6

## 2018-07-27 MED ORDER — SODIUM BICARBONATE 8.4 % IV SOLN
INTRAVENOUS | Status: DC
  Administered 2018-07-28 (×3): via INTRAVENOUS
  Filled 2018-07-27 (×5): qty 150

## 2018-07-27 MED ORDER — ALTEPLASE 2 MG IJ SOLR
2.0000 mg | INTRAMUSCULAR | Status: DC | PRN
  Administered 2018-07-27: 2 mg
  Filled 2018-07-27: qty 2

## 2018-07-27 MED ORDER — FENTANYL BOLUS VIA INFUSION
50.0000 ug | INTRAVENOUS | Status: DC | PRN
  Filled 2018-07-27: qty 100

## 2018-07-27 MED ORDER — FOLIC ACID 1 MG PO TABS
1.0000 mg | ORAL_TABLET | Freq: Every day | ORAL | Status: DC
  Administered 2018-07-27: 1 mg via ORAL
  Filled 2018-07-27: qty 1

## 2018-07-27 MED ORDER — ETOMIDATE 2 MG/ML IV SOLN
INTRAVENOUS | Status: AC
  Administered 2018-07-27: 20 mg
  Filled 2018-07-27: qty 10

## 2018-07-27 MED ORDER — PROPOFOL 1000 MG/100ML IV EMUL: 0.0000 ug/kg/min | INTRAVENOUS | Status: DC

## 2018-07-27 MED ORDER — SODIUM CHLORIDE 0.9 % IV SOLN
0.0000 ug/min | INTRAVENOUS | Status: DC
  Administered 2018-07-27: 100 ug/min via INTRAVENOUS
  Administered 2018-07-28 – 2018-07-29 (×13): 400 ug/min via INTRAVENOUS
  Filled 2018-07-27 (×2): qty 4
  Filled 2018-07-27: qty 40
  Filled 2018-07-27: qty 4
  Filled 2018-07-27: qty 40
  Filled 2018-07-27: qty 4
  Filled 2018-07-27 (×3): qty 40
  Filled 2018-07-27: qty 4
  Filled 2018-07-27 (×2): qty 40
  Filled 2018-07-27: qty 4
  Filled 2018-07-27 (×2): qty 40

## 2018-07-27 MED ORDER — NOREPINEPHRINE 16 MG/250ML-% IV SOLN
0.0000 ug/min | INTRAVENOUS | Status: DC
  Administered 2018-07-27: 50 ug/min via INTRAVENOUS
  Administered 2018-07-27: 10 ug/min via INTRAVENOUS
  Administered 2018-07-28: 80 ug/min via INTRAVENOUS
  Administered 2018-07-28: 50 ug/min via INTRAVENOUS
  Administered 2018-07-28: 75 ug/min via INTRAVENOUS
  Administered 2018-07-28: 50 ug/min via INTRAVENOUS
  Administered 2018-07-28: 75 ug/min via INTRAVENOUS
  Administered 2018-07-29: 80 ug/min via INTRAVENOUS
  Filled 2018-07-27 (×10): qty 250

## 2018-07-27 MED ORDER — NOREPINEPHRINE 16 MG/250ML-% IV SOLN: 0.0000 ug/min | INTRAVENOUS | Status: DC

## 2018-07-27 MED ORDER — SODIUM CHLORIDE 0.9 % IV SOLN
INTRAVENOUS | Status: DC
  Administered 2018-07-27 – 2018-07-28 (×2): via INTRAVENOUS

## 2018-07-27 MED ORDER — PROPOFOL 1000 MG/100ML IV EMUL
INTRAVENOUS | Status: AC
  Filled 2018-07-27: qty 100

## 2018-07-27 MED ORDER — FENTANYL 2500MCG IN NS 250ML (10MCG/ML) PREMIX INFUSION
INTRAVENOUS | Status: AC
  Administered 2018-07-27: 100 ug/h via INTRAVENOUS
  Filled 2018-07-27: qty 250

## 2018-07-27 MED ORDER — VITAMIN B-1 100 MG PO TABS
100.0000 mg | ORAL_TABLET | Freq: Every day | ORAL | Status: DC
  Administered 2018-07-27: 100 mg via ORAL
  Filled 2018-07-27: qty 1

## 2018-07-27 MED ORDER — FENTANYL 2500MCG IN NS 250ML (10MCG/ML) PREMIX INFUSION
25.0000 ug/h | INTRAVENOUS | Status: DC
  Administered 2018-07-27: 100 ug/h via INTRAVENOUS

## 2018-07-27 MED ORDER — PUREFLOW DIALYSIS SOLUTION
INTRAVENOUS | Status: DC
  Administered 2018-07-27 – 2018-07-28 (×2): via INTRAVENOUS_CENTRAL

## 2018-07-27 NOTE — Progress Notes (Signed)
Patient ID: Jeff Leonard, male   DOB: Jan 13, 1951, 67 y.o.   MRN: [id number]  Today he appears somewhat depressed.  He states that he does not have any specific issues and that his pain is under reasonable control.  He just is worried about his overall health and is concerned about his kidneys.  I explained to him that it was normal to be concerned but that we had a plan for managing his renal dysfunction.  His thoracotomy wound is well approximated.  There is no drainage.  I did remove all the dressings.  There is some serosanguineous chest tube drainage of about 50 cc for the last 24 hours.  He is only made 10 cc of urine output in the last 12 hours.  His lungs are actually equal bilaterally.  His heart is regular.  He is relatively hypotensive with a blood pressure of about 100/60.  His lower extremity show 1-2+ edema.  His chest x-ray today shows well-expanded lung fields.  There is no pneumothorax or pleural effusion.  We will attempt to monitor his CVP as he has a catheter in place.  We will encourage physical therapy at bedside.  Dialysis as per our nephrology team.  We will leave his chest tubes to waterseal today.

## 2018-07-27 NOTE — Progress Notes (Signed)
[facility]  ROUNDING NOTE   Subjective:   Wife at bedside.   Emergent hemodialysis treatment yesterday afternoon for hyperklaemia and hyponatremia.   Anuric. Requiring vasopressors: norepinephrine gtt    Objective:  Vital signs in last 24 hours:  Temp:  [89.3 F (31.8 C)-99.1 F (37.3 C)] 98.7 F (37.1 C) (10/07 0800) Pulse Rate:  [98-117] 103 (10/07 1000) Resp:  [18-32] 32 (10/07 1000) BP: (57-103)/(37-74) 92/66 (10/07 1000) SpO2:  [93 %-100 %] 98 % (10/07 1000) Weight:  [132.7 kg-133.8 kg] 133.8 kg (10/07 0425)  Weight change:  Filed Weights   07/21/2018 1700 07/26/18 1224 07/27/18 0425  Weight: 130.8 kg 132.7 kg 133.8 kg    Intake/Output: I/O last 3 completed shifts: In: 4069.8 [I.V.:3569.8; IV Piggyback:500] Out: 82 [Urine:235; Chest Tube:47]   Intake/Output this shift:  Total I/O In: 180 [P.O.:180] Out: -   Physical Exam: General: Ill appearing  Head: Normocephalic, atraumatic. Moist oral mucosal membranes  Eyes: Anicteric, PERRL  Neck: Supple, trachea midline  Lungs:  Bilateral crackles  Heart: tachycardia  Abdomen:  Soft, nontender, obese  Extremities:  1+  peripheral edema.  Neurologic: Nonfocal, moving all four extremities  Skin: No lesions  Access: RIJ temp HD catheter    Basic Metabolic Panel: Recent Labs  Lab 07/26/18 1607 07/26/18 1619 07/26/18 1739 07/26/18 1954 07/27/18 0001 07/27/18 0400  NA  --  134* 135 135 137 135  K  --  5.7* 4.5 4.8 4.8 4.8  CL  --  100 100 100 100 99  CO2  --  21* 22 20* 19* 21*  GLUCOSE  --  149* 144* 155* 145* 155*  BUN  --  59* 48* 51* 53* 55*  CREATININE  --  5.51* 4.27* 5.19* 5.75* 6.20*  CALCIUM  --  8.2* 8.1* 7.9* 7.7* 7.6*  MG 2.0  --  1.7  --  1.7 1.8  PHOS 6.4* 6.3* 5.1* 6.6* 7.5*  --     Liver Function Tests: Recent Labs  Lab 07/21/18 2118 07/25/18 0605 07/26/18 0424 07/26/18 1619 07/26/18 1739 07/26/18 1954 07/27/18 0001 07/27/18 0400  AST 20 28 33  --   --   --   --   25  ALT 14 16 21   --   --   --   --  17  ALKPHOS 37* 42 50  --   --   --   --  45  BILITOT 0.5 1.6* 1.1  --   --   --   --  1.2  PROT 7.2 7.9 7.4  --   --   --   --  7.0  ALBUMIN 3.2* 3.3* 3.2* 3.1* 2.9* 2.9* 2.6* 2.7*   No results for input(s): LIPASE, AMYLASE in the last 168 hours. No results for input(s): AMMONIA in the last 168 hours.  CBC: Recent Labs  Lab 07/24/2018 1638 07/23/18 0325 07/24/18 0403 07/25/18 0605 07/27/18 0400  WBC 13.9* 14.3* 14.0* 16.9* 21.9*  HGB 11.9* 11.4* 10.9* 9.9* 8.5*  HCT 35.0* 33.9* 32.1* 31.3* 26.3*  MCV 95.1 93.7 93.9 95.6 94.1  PLT 209 226 204 223 126*    Cardiac Enzymes: No results for input(s): CKTOTAL, CKMB, CKMBINDEX, TROPONINI in the last 168 hours.  BNP: Invalid input(s): POCBNP  CBG: Recent Labs  Lab 07/28/2018 1610 07/25/18 0547 07/26/18 1228  GLUCAP 152* 171* 121*    Microbiology: Results for orders placed or performed during the hospital encounter of 06/25/2018  Aerobic/Anaerobic Culture (surgical/deep wound)  Status: None   Collection Time: 06/22/2018 11:30 AM  Result Value Ref Range Status   Specimen Description   Final    WOUND PLEURAL RIGHT Performed at [facility], [address], [place], [place] [address]    Special Requests   Final    Normal Performed at [facility], [address] [place], [place] [address]    Gram Stain   Final    RARE WBC PRESENT, PREDOMINANTLY MONONUCLEAR NO ORGANISMS SEEN    Culture   Final    No growth aerobically or anaerobically. Performed at [facility], [address] [place], [place] [address]    Report Status 07/23/2018 FINAL  Final  Fungus Culture With Stain     Status: None (Preliminary result)   Collection Time: 08/02/18 10:15 AM  Result Value Ref Range Status   Fungus Stain Final report  Final    Comment: (NOTE) Performed At: [facility] [address] [place], [place] [id number] Jolene Schimke MD OZ:[id number]    Fungus  (Mycology) Culture PENDING  Incomplete   Fungal Source PLEURAL  Final    Comment: Performed at [facility], [address] [place], [place] [address]  Aerobic/Anaerobic Culture (surgical/deep wound)     Status: None   Collection Time: 08-02-2018 10:15 AM  Result Value Ref Range Status   Specimen Description   Final    PLEURAL Performed at [facility], [address], [place], [place] [address]    Special Requests   Final    PLEURAL Performed at [facility], [address] [place], [place] [address]    Gram Stain   Final    FEW WBC PRESENT,BOTH PMN AND MONONUCLEAR NO ORGANISMS SEEN    Culture   Final    No growth aerobically or anaerobically. Performed at [facility], [address], [place], [place] [address]    Report Status 07/27/2018 FINAL  Final  Acid Fast Smear (AFB)     Status: None   Collection Time: 2018-08-02 10:15 AM  Result Value Ref Range Status   AFB Specimen Processing Concentration  Final   Acid Fast Smear Negative  Final    Comment: (NOTE) Performed At: [facility] [address] [place], [place] [id number] Jolene Schimke MD QV:[id number]    Source (AFB) PLEURAL  Final    Comment: Performed at [facility], [address] [place], [place] [address]  Fungus Culture Result     Status: None   Collection Time: 02-Aug-2018 10:15 AM  Result Value Ref Range Status   Result 1 Comment  Final    Comment: (NOTE) KOH/Calcofluor preparation:  no fungus observed. Performed At: [facility] [address] [place], [place] [id number] Jolene Schimke MD KZ:[id number]   MRSA PCR Screening     Status: None   Collection Time: 08-02-2018  4:38 PM  Result Value Ref Range Status   MRSA by PCR NEGATIVE NEGATIVE Final    Comment:        The GeneXpert MRSA Assay (FDA approved for NASAL specimens only), is one component of a comprehensive MRSA colonization surveillance program. It is not intended to diagnose  MRSA infection nor to guide or monitor treatment for MRSA infections. Performed at [facility], [address] [place], [place] [address]     Coagulation Studies: No results for input(s): LABPROT, INR in the last 72 hours.  Urinalysis: No results for input(s): COLORURINE, LABSPEC, PHURINE, GLUCOSEU, HGBUR, BILIRUBINUR, KETONESUR,  PROTEINUR, UROBILINOGEN, NITRITE, LEUKOCYTESUR in the last 72 hours.  Invalid input(s): APPERANCEUR    Imaging: US Renal  Result Date: 07/25/2018 CLINICAL DATA:  Acute kidney injury, acute renal failure EXAM: RENAL / URINARY TRACT ULTRASOUND COMPLETE COMPARISON:  None FINDINGS: Right Kidney: Length: 10.9 cm. Cortical thinning. Increased cortical echogenicity. No mass, hydronephrosis or shadowing calcification. Left Kidney: Length: 10.5 cm. Marked cortical thinning. Increased cortical echogenicity. No gross evidence of mass or hydronephrosis, less well visualized than RIGHT kidney due to body habitus and poor acoustic window Bladder: Decompressed by Foley catheter, unable to evaluate IMPRESSION: BILATERAL renal cortical atrophy and medical renal disease changes. No gross renal mass or hydronephrosis identified. Electronically Signed   By: Ulyses Southward M.D.   On: 07/25/2018 12:51   Dg Chest Port 1 View  Result Date: 07/27/2018 CLINICAL DATA:  Follow-up right-sided hemothorax. History of previous pulmonary embolism, MI. EXAM: PORTABLE CHEST 1 VIEW COMPARISON:  Portable chest x-ray of July 26, 2018 FINDINGS: There remains volume loss on the right. There is a moderate amount of pleural fluid along the convexity of the lung. No pneumothorax is evident. 3 right-sided chest tubes remain in position. There is slight shift of the mediastinum toward the right which is stable. The left lung exhibits minimal basilar density. The heart and pulmonary vascularity are normal. The right internal jugular venous catheter tip projects at the cavoatrial junction. The observed  bony thorax is unremarkable. IMPRESSION: No pneumothorax. Persistent pleural fluid or pleural thickening along the convexity of the right lung. Stable appearance of the 3 right-sided chest tubes. Minimal atelectasis at the left lung base. Electronically Signed   By: David  [place]az[place]and M.D.   On: 07/27/2018 07:29   Dg Chest Port 1 View  Result Date: 07/26/2018 CLINICAL DATA:  Central line placement, history MI EXAM: PORTABLE CHEST 1 VIEW COMPARISON:  Portable exam 1459 hours compared to 07/26/2018 at 0637 hours FINDINGS: Three RIGHT thoracostomy tubes unchanged. New large bore RIGHT jugular central venous catheter with tip projecting over RIGHT atrium. No pneumothorax. Stable heart size and mediastinal contours. RIGHT basilar atelectasis with persistent RIGHT pleural effusion. Minimal LEFT basilar atelectasis. IMPRESSION: No pneumothorax following RIGHT jugular line placement. Multiple RIGHT thoracostomy tubes with small RIGHT pleural effusion and RIGHT basilar atelectasis. Electronically Signed   By: Ulyses Southward M.D.   On: 07/26/2018 15:12   Dg Chest Port 1 View  Result Date: 07/26/2018 CLINICAL DATA:  Right chest tube EXAM: PORTABLE CHEST 1 VIEW COMPARISON:  07/25/2018 FINDINGS: Right chest tubes remain in place. No pneumothorax. Continued right pleural effusion, patchy opacities throughout the right lung and left base atelectasis. Heart is normal size. IMPRESSION: No significant change since prior study.  No pneumothorax. Electronically Signed   By: Charlett Nose M.D.   On: 07/26/2018 07:23     Medications:   . sodium chloride    . sodium chloride    . norepinephrine (LEVOPHED) Adult infusion 10 mcg/min (07/27/18 0937)  . pureflow     . bisacodyl  10 mg Oral Daily  . Chlorhexidine Gluconate Cloth  6 each Topical Q0600  . dorzolamide  1 drop Both Eyes Daily  . gabapentin  600 mg Oral TID  . heparin lock flush  500 Units Intravenous Once  . latanoprost  1 drop Both Eyes QHS  . sodium chloride  flush  10-40 mL Intracatheter Q12H  . timolol  1 drop Both Eyes Daily   sodium chloride, sodium chloride, acetaminophen, albuterol, alteplase, cyclobenzaprine, heparin, heparin, lidocaine (PF),  lidocaine-prilocaine, metoCLOPramide (REGLAN) injection, morphine injection, ondansetron (ZOFRAN) IV, oxyCODONE-acetaminophen, pentafluoroprop-tetrafluoroeth, sodium chloride flush  Assessment/ Plan:  [name] is a 67 y.o. black  male with right-sided hydropneumothorax with history of right thoracotomy and decortication of visceral and parietal pleura, history of PE, obstructive sleep apnea, coronary artery disease, glaucoma, who was admitted to [facility] on 07/05/2018 for evaluation and treatment of right-sided hydropneumothorax.  1.  Acute renal failure with hyperkalemia and hyponatremia: Baseline creatinine of 0.98 with normal GFR on 07/23/18. Urinalysis bland on 6/18.  Acute renal failure secondary to ATN with hypotension - Anuric - Requiring renal replacement therapy. Emergent hemodialysis yesterday 10/6. - Hemodynamically unstable: transition to CRRT today. CVVHD with no UF. Orders placed.   2. Hypotension: requiring vasopressors.    LOS: 11 Evita Merida 10/7/201910:49 AM

## 2018-07-27 NOTE — Progress Notes (Signed)
CRRT running w/n issues.  Patient's hypotension markedly increased with initiation of CRRT, however.  Levophed temporarily increased to 50 mcg/min and CRRT blood flow rate reduced to 200/min while vasopressin was added at .04.  Levo eventually titrated back down to 40 mcgs and blood flow rate increased to 300 mls/min with MAP of minimum 65 maintained  Pt medicated w/ reglan and zofran for nausea, resting quietly at this time.  Wife at bedside.  No UOP and scant drainage from CTs.

## 2018-07-27 NOTE — Progress Notes (Signed)
Initial Nutrition Assessment  DOCUMENTATION CODES:   Obesity unspecified  INTERVENTION:  Provide Nepro Shake po TID, each supplement provides 425 kcal and 19 grams protein.  Discussed increased needs for protein while on CRRT.   Provide Ocuvite daily, pyridoxine 100 mg daily, thiamine 778 mg daily, folic acid 1 mg daily while on CRRT.  NUTRITION DIAGNOSIS:   Inadequate oral intake related to decreased appetite as evidenced by per patient/family report.  GOAL:   Patient will meet greater than or equal to 90% of their needs  MONITOR:   PO intake, Supplement acceptance, Labs, Weight trends, Skin, I & O's  REASON FOR ASSESSMENT:   LOS    ASSESSMENT:   67 year old male with PMHx of sleep apnea, glaucoma, CAD, hx MI, hx of pulmonary embolism who was admitted with right-sided pleural effusion, hemothorax s/p thoracotomy with decortication and placement of chest tube, also with acute renal failure.   -Patient required emergent HD yesterday. Today starting on CRRT.  Met with patient at bedside. He reports that for the past 3 days he has had a decreased appetite and intake. He is just generally not feeling well. Reports that PTA and earlier in the admission he had a great appetite and was finishing almost all of his meals. Typically eats 3 meals per day and chooses a source of protein at each meal.   UBW 285 lbs. Patient reports he is weight stable. Patient appears weight-stable per chart, as well. Currently 133.8 kg (294.98 lbs), which is likely elevated with fluid as he was 126.6 kg (279.1 lbs) on 9/29.  Meal Completion: now only 30-40%; was 95-100% previously  Medications reviewed and include: Dulcolax 10 mg daily, gabapentin, norepinephrine gtt at 50 mcg/min.  Labs reviewed: CO2 21, BUN 55, Creatinine 6.2, Phosphorus 6.5.  I/O: 10 mL UOP yesterday  Patient does not meet criteria for malnutrition at this time.  Discussed with RN and on rounds.  NUTRITION - FOCUSED  PHYSICAL EXAM:    Most Recent Value  Orbital Region  No depletion  Upper Arm Region  No depletion  Thoracic and Lumbar Region  No depletion  Buccal Region  No depletion  Temple Region  No depletion  Clavicle Bone Region  No depletion  Clavicle and Acromion Bone Region  No depletion  Scapular Bone Region  No depletion  Dorsal Hand  No depletion  Patellar Region  Unable to assess  Anterior Thigh Region  Unable to assess  Posterior Calf Region  Unable to assess  Edema (RD Assessment)  Moderate  Hair  Reviewed  Eyes  Reviewed  Mouth  Reviewed  Skin  Reviewed  Nails  Reviewed     Diet Order:   Diet Order            Diet Heart Room service appropriate? Yes; Fluid consistency: Thin  Diet effective now              EDUCATION NEEDS:   Education needs have been addressed  Skin:  Skin Assessment: Skin Integrity Issues:(closed incision to right posterior upper chest)  Last BM:  07/24/2018 per chart but not BM characteristics have been documented  Height:   Ht Readings from Last 1 Encounters:  07/26/18 '6\' 1"'$  (1.854 m)    Weight:   Wt Readings from Last 1 Encounters:  07/27/18 133.8 kg    Ideal Body Weight:  83.6 kg  BMI:  Body mass index is 38.92 kg/m.  Estimated Nutritional Needs:   Kcal:  2300-2500 (MSJ x  1.1-1.2)  Protein:  140-160 grams (1.1-1.3 grams/kg while on CRRT)  Fluid:  UOP + 1L  Willey Blade, MS, RD, LDN Office: [phone] Pager: [phone] After Hours/Weekend Pager: [phone]

## 2018-07-27 NOTE — Progress Notes (Signed)
MEDICATION RELATED CONSULT NOTE - FOLLOW UP   Pharmacy Consult for Medication Review Indication: CRRT  No Known Allergies  Labs: Recent Labs    07/25/18 0605 07/26/18 0424  07/26/18 1739 07/26/18 1954 07/27/18 0001 07/27/18 0400 07/27/18 1350  WBC 16.9*  --   --   --   --   --  21.9*  --   HGB 9.9*  --   --   --   --   --  8.5*  --   HCT 31.3*  --   --   --   --   --  26.3*  --   PLT 223  --   --   --   --   --  126*  --   CREATININE 3.50* 5.45*   < > 4.27* 5.19* 5.75* 6.20*  --   MG  --   --    < > 1.7  --  1.7 1.8 1.9  PHOS  --   --    < > 5.1* 6.6* 7.5*  --   --   ALBUMIN 3.3* 3.2*   < > 2.9* 2.9* 2.6* 2.7*  --   PROT 7.9 7.4  --   --   --   --  7.0  --   AST 28 33  --   --   --   --  25  --   ALT 16 21  --   --   --   --  17  --   ALKPHOS 42 50  --   --   --   --  45  --   BILITOT 1.6* 1.1  --   --   --   --  1.2  --    < > = values in this interval not displayed.   Estimated Creatinine Clearance: 16.8 mL/min (A) (by C-G formula based on SCr of 6.2 mg/dL (H)).   Assessment: Patient is a 67yo male admitted for hemothorax. Has been started on CRRT, pharmacy consulted for daily medication review.  Plan:  No adjustments warranted at this time. Will continue to monitor medications daily.   Mauri Reading, PharmD Pharmacy Resident  07/27/2018 3:38 PM

## 2018-07-27 NOTE — Progress Notes (Signed)
[facility] [place] [facility]   PATIENT NAME: Jeff Leonard    MR#:  [id number]  DATE OF BIRTH:  1950-12-13  SUBJECTIVE:  Patient's getting CRRT today, feeling miserable.  Hypotensive on pressors.  No urine output patient is anuric Has chest tubes placed in for right-sided loculated pleural effusion/pneumothorax.  Wife at bedside  REVIEW OF SYSTEMS:   Review of Systems  Constitutional: Negative for chills, fever and weight loss.  HENT: Negative for ear discharge, ear pain and nosebleeds.   Eyes: Negative for blurred vision, pain and discharge.  Respiratory: Negative for sputum production, shortness of breath, wheezing and stridor.   Cardiovascular: Positive for chest pain. Negative for palpitations, orthopnea and PND.  Gastrointestinal: Negative for abdominal pain, diarrhea, nausea and vomiting.  Genitourinary: Negative for frequency and urgency.  Musculoskeletal: Negative for back pain and joint pain.  Neurological: Negative for sensory change, speech change, focal weakness and weakness.  Psychiatric/Behavioral: Negative for depression and hallucinations. The patient is not nervous/anxious.    Tolerating Diet:yesTolerating PT: ambulatory  DRUG ALLERGIES:  No Known Allergies  VITALS:  Blood pressure 96/68, pulse 94, temperature 98.2 F (36.8 C), temperature source Axillary, resp. rate (!) 28, height 6\' 1"  (1.854 m), weight 133.8 kg, SpO2 100 %.  PHYSICAL EXAMINATION:   Physical Exam  GENERAL:  67 y.o.-year-old patient lying in the bed with no acute distress. obese EYES: Pupils equal, round, reactive to light and accommodation. No scleral icterus. Extraocular muscles intact.  HEENT: Head atraumatic, normocephalic. Oropharynx and nasopharynx clear.  NECK:  Supple, no jugular venous distention. No thyroid enlargement, no tenderness.  LUNGS: Normal breath sounds bilaterally, no wheezing, rales, rhonchi. No use of accessory muscles of respiration.   3Right sided CT + CARDIOVASCULAR: S1, S2 normal. No murmurs, rubs, or gallops.  ABDOMEN: Soft, nontender, nondistended. Bowel sounds present. No organomegaly or mass.  EXTREMITIES: No cyanosis, clubbing or edema b/l.    NEUROLOGIC: Cranial nerves II through XII are intact. No focal Motor or sensory deficits b/l.   PSYCHIATRIC:  patient is alert and oriented x 3.  SKIN: No obvious rash, lesion, or ulcer.   LABORATORY PANEL:  CBC Recent Labs  Lab 07/27/18 0400  WBC 21.9*  HGB 8.5*  HCT 26.3*  PLT 126*    Chemistries  Recent Labs  Lab 07/27/18 0400 07/27/18 1350  NA 135 134*  K 4.8 4.9  CL 99 100  CO2 21* 21*  GLUCOSE 155* 179*  BUN 55* 54*  CREATININE 6.20* 6.36*  CALCIUM 7.6* 7.6*  MG 1.8 1.9  AST 25  --   ALT 17  --   ALKPHOS 45  --   BILITOT 1.2  --    Cardiac Enzymes No results for input(s): TROPONINI in the last 168 hours. RADIOLOGY:  Dg Chest Port 1 View  Result Date: 07/27/2018 CLINICAL DATA:  Follow-up right-sided hemothorax. History of previous pulmonary embolism, MI. EXAM: PORTABLE CHEST 1 VIEW COMPARISON:  Portable chest x-ray of July 26, 2018 FINDINGS: There remains volume loss on the right. There is a moderate amount of pleural fluid along the convexity of the lung. No pneumothorax is evident. 3 right-sided chest tubes remain in position. There is slight shift of the mediastinum toward the right which is stable. The left lung exhibits minimal basilar density. The heart and pulmonary vascularity are normal. The right internal jugular venous catheter tip projects at the cavoatrial junction. The observed bony thorax is unremarkable. IMPRESSION: No pneumothorax. Persistent pleural fluid or  pleural thickening along the convexity of the right lung. Stable appearance of the 3 right-sided chest tubes. Minimal atelectasis at the left lung base. Electronically Signed   By: David  [place] M.D.   On: 07/27/2018 07:29   Dg Chest Port 1 View  Result Date:  07/26/2018 CLINICAL DATA:  Central line placement, history MI EXAM: PORTABLE CHEST 1 VIEW COMPARISON:  Portable exam 1459 hours compared to 07/26/2018 at 0637 hours FINDINGS: Three RIGHT thoracostomy tubes unchanged. New large bore RIGHT jugular central venous catheter with tip projecting over RIGHT atrium. No pneumothorax. Stable heart size and mediastinal contours. RIGHT basilar atelectasis with persistent RIGHT pleural effusion. Minimal LEFT basilar atelectasis. IMPRESSION: No pneumothorax following RIGHT jugular line placement. Multiple RIGHT thoracostomy tubes with small RIGHT pleural effusion and RIGHT basilar atelectasis. Electronically Signed   By: Ulyses Southward M.D.   On: 07/26/2018 15:12   Dg Chest Port 1 View  Result Date: 07/26/2018 CLINICAL DATA:  Right chest tube EXAM: PORTABLE CHEST 1 VIEW COMPARISON:  07/25/2018 FINDINGS: Right chest tubes remain in place. No pneumothorax. Continued right pleural effusion, patchy opacities throughout the right lung and left base atelectasis. Heart is normal size. IMPRESSION: No significant change since prior study.  No pneumothorax. Electronically Signed   By: Charlett Nose M.D.   On: 07/26/2018 07:23   ASSESSMENT AND PLAN:   67 year old admitted with loculated pleural effusion on CT surgery service  *AKI-with hyperkalemia and anuria secondary to ATN with hypotension Etiology unclear at this time  Vascular surgery consulted for vascular access Nephrology consulted and discussed with Dr. Cherylann Ratel EKG with no acute T wave changes Hydrate with IV fluids Renal ultrasound-bilateral renal cortical atrophy and medical renal disease change-renal vascular duplex study or CTAs recommended Avoid nephrotoxins and NSAIDs   *Right loculated pleural effusion-chronic -S/p thoracotomy and right chest tube placement on 07/20/2018 by Dr. Oakes/cardiothoracic surgery- -s/p pleurodesis/decortication on oct 2nd 2019 -chest tubes under waterseal today -Repeat  chest x-ray with no pneumothorax or pleural effusion  *Acute right leg pain with muscle cramp Pain meds as needed, muscle relaxants  *History of recurrent pulmonary embolism and DVT Vascular surgery consulted - S/p IVC filter placement -pt currently off xarelto. Had been on it for 4 years.  -Ultrasound bilateral LE negative.  As ultrasound negative  consider not restarting anticoagulation -will d/w PCP dr Dario Guardian on monday  *Hypotension patient is on pressor  *Glaucoma  continue eyedrops  *DVT prophylaxis with SCDs.  Case discussed with Care Management/Social Worker. Management plans discussed with the patient, family and they are in agreement.  CODE STATUS: full  DVT Prophylaxis: SCD  TOTAL CRITICAL CARE TIME TAKING CARE OF THIS PATIENT: 41 minutes.  >50% time spent on counselling and coordination of care  POSSIBLE D/C IN *1-2* DAYS, DEPENDING ON CLINICAL CONDITION.  Note: This dictation was prepared with Dragon dictation along with smaller phrase technology. Any transcriptional errors that result from this process are unintentional.  Ramonita Lab M.D on 07/27/2018 at 4:34 PM  Between 7am to 6pm - Pager - [phone]  After 6pm go to [email or web address] - Social research officer, government  [facility]  Office  [phone]  CC: Primary care physician; Sherrie Mustache, MDPatient ID: Jeff Leonard, male   DOB: Jun 06, 1951, 67 y.o.   MRN: [id number]

## 2018-07-27 NOTE — Progress Notes (Signed)
Follow up - Critical Care Medicine Note  Patient Details:    Jeff Leonard is an 67 y.o. male.with a past medical history remarkable for obstructive sleep apnea, coronary artery disease, glaucoma, prior history of pulmonary embolism, patient with right-sided pleural effusion, hemothorax, status post thoracotomy with decortication, status post IVC filter developed progressive acute renal failure with hyperkalemia.  Being moved to the intensive care unit with tachycardia, marginal blood pressure, in need of hemodialysis.    Lines, Airways, Drains: Urethral Catheter Juan Double-lumen 46 Fr. (Active)  Indication for Insertion or Continuance of Catheter Acute urinary retention 07/27/2018  4:00 AM  Site Assessment Clean;Intact 07/27/2018  4:00 AM  Catheter Maintenance Bag below level of bladder;Catheter secured;Drainage bag/tubing not touching floor;Insertion date on drainage bag;No dependent loops 07/27/2018  4:00 AM  Collection Container Standard drainage bag 07/27/2018  4:00 AM  Securement Method Leg strap 07/27/2018  4:00 AM  Output (mL) 10 mL 07/27/2018  6:20 AM     Y Chest Tube 1, 2, and 3 Right Pleural 32 Fr. Right Pleural 32 Fr. Right Pleural 32 Fr. (Active)  Suction -20 cm H2O 07/27/2018  4:00 AM  Chest Tube Air Leak None 07/27/2018  4:00 AM  Patency Intervention Tip/tilt 07/23/2018  9:00 PM  Drainage Description 1 Serosanguineous 07/27/2018  4:00 AM  Dressing Status 1 Clean;Dry;Intact 07/27/2018  4:00 AM  Dressing Intervention 1 Dressing reinforced 07/26/2018  4:33 PM  Site Assessment 1 Other (Comment) 07/27/2018  4:00 AM  Surrounding Skin 1 Unable to view 07/27/2018  4:00 AM  Drainage Description 2 Serosanguineous 07/27/2018  4:00 AM  Dressing Status 2 Clean;Dry;Intact 07/27/2018  4:00 AM  Dressing Intervention 2 Dressing reinforced 07/26/2018  4:33 PM  Site Assessment 2 Other (Comment) 07/27/2018  4:00 AM  Surrounding Skin 2 Unable to view 07/27/2018  4:00 AM  Drainage Description 3 Serosanguineous  07/27/2018  4:00 AM  Dressing Status 3 Clean;Dry;Intact 07/27/2018  4:00 AM  Dressing Intervention 3 Dressing reinforced 07/26/2018  4:33 PM  Site Assessment 3 Other (Comment) 07/27/2018  4:00 AM  Surrounding Skin 3 Unable to view 07/27/2018  4:00 AM  Output (mL) 7 mL 07/27/2018  4:00 AM    Anti-infectives:  Anti-infectives (From admission, onward)   Start     Dose/Rate Route Frequency Ordered Stop   07/23/2018 2030  ceFAZolin (ANCEF) IVPB 2g/100 mL premix     2 g 200 mL/hr over 30 Minutes Intravenous Every 8 hours 08/05/2018 1516 07/23/18 0400   07/21/18 2105  ceFAZolin (ANCEF) IVPB 2g/100 mL premix     2 g 200 mL/hr over 30 Minutes Intravenous 30 min pre-op 07/21/18 2105 08/09/2018 1235   07/10/2018 1230  ceFAZolin (ANCEF) IVPB 2g/100 mL premix    Note to Pharmacy:  Send with patient to Specials   2 g 200 mL/hr over 30 Minutes Intravenous On call 06/22/2018 1229 07/02/2018 1555      Microbiology: Results for orders placed or performed during the hospital encounter of 07/11/2018  Aerobic/Anaerobic Culture (surgical/deep wound)     Status: None   Collection Time: 07/20/2018 11:30 AM  Result Value Ref Range Status   Specimen Description   Final    WOUND PLEURAL RIGHT Performed at [facility], [place] [address], [place], [place] [address]    Special Requests   Final    Normal Performed at [facility], [place], [place] [address]    Gram Stain   Final    RARE WBC PRESENT, PREDOMINANTLY MONONUCLEAR  NO ORGANISMS SEEN    Culture   Final    No growth aerobically or anaerobically. Performed at [facility], [place] [address], [place], [place] [address]    Report Status 07/23/2018 FINAL  Final  Fungus Culture With Stain     Status: None (Preliminary result)   Collection Time: 08/17/2018 10:15 AM  Result Value Ref Range Status   Fungus Stain Final report  Final    Comment: (NOTE) Performed At: [facility] [place], [place] [id number] Rush Farmer MD [id number]    Fungus (Mycology) Culture PENDING  Incomplete   Fungal Source PLEURAL  Final    Comment: Performed at [facility], [address] [place], [place] [address]  Aerobic/Anaerobic Culture (surgical/deep wound)     Status: None (Preliminary result)   Collection Time: 07/25/2018 10:15 AM  Result Value Ref Range Status   Specimen Description   Final    PLEURAL Performed at [facility], [address], [place], [place] [address]    Special Requests   Final    PLEURAL Performed at [facility], [address] [place], [place] [address]    Gram Stain   Final    FEW WBC PRESENT,BOTH PMN AND MONONUCLEAR NO ORGANISMS SEEN    Culture   Final    NO GROWTH 4 DAYS NO ANAEROBES ISOLATED; CULTURE IN PROGRESS FOR 5 DAYS Performed at [facility] [address] [place], [place] [address]    Report Status PENDING  Incomplete  Acid Fast Smear (AFB)     Status: None   Collection Time: 08/01/2018 10:15 AM  Result Value Ref Range Status   AFB Specimen Processing Concentration  Final   Acid Fast Smear Negative  Final    Comment: (NOTE) Performed At: [facility] [address] [place], [place] [id number] Rush Farmer MD [id number]    Source (AFB) PLEURAL  Final    Comment: Performed at [facility], [place] [place], [place] [address]  Fungus Culture Result     Status: None   Collection Time: 07/24/2018 10:15 AM  Result Value Ref Range Status   Result 1 Comment  Final    Comment: (NOTE) KOH/Calcofluor preparation:  no fungus observed. Performed At: [facility] [place], [place] [id number] Rush Farmer MD [id number]   MRSA PCR Screening     Status: None   Collection Time: 08/09/2018  4:38 PM  Result Value Ref Range Status   MRSA by PCR NEGATIVE NEGATIVE Final    Comment:        The GeneXpert MRSA Assay (FDA approved for NASAL specimens only), is one component of  a comprehensive MRSA colonization surveillance program. It is not intended to diagnose MRSA infection nor to guide or monitor treatment for MRSA infections. Performed at [facility], [address] [place], [place] [address]    Studies: X-ray Chest Pa Or Ap  Result Date: 07/15/2018 CLINICAL DATA:  Status post thoracentesis. EXAM: CHEST  1 VIEW COMPARISON:  Radiographs of December 30, 2017. FINDINGS: The heart size and mediastinal contours are within normal limits. Left lung is clear. Large loculated pleural effusion is noted superiorly and laterally in right hemithorax. No pneumothorax is noted. The visualized skeletal structures are unremarkable. IMPRESSION: Large loculated pleural effusion seen superiorly and laterally in right hemithorax. Electronically Signed   By: Marijo Conception, M.D.   On: 07/15/2018 13:33   Ct Chest Wo Contrast  Result Date: 07/19/2018 CLINICAL DATA:  Chest tube EXAM: CT CHEST WITHOUT CONTRAST TECHNIQUE: Multidetector CT imaging of the chest was performed following the standard protocol without IV contrast. COMPARISON:  Chest CT 07/12/2018 FINDINGS: Cardiovascular: Coronary artery calcification and aortic atherosclerotic calcification. Port in the anterior chest wall with tip in distal SVC. Mediastinum/Nodes: No axillary or supraclavicular adenopathy. No mediastinal or hilar adenopathy. No pericardial effusion. Esophagus normal. Lungs/Pleura: Interval evacuation of the loculated pleural fluid at the RIGHT Upper lobe following chest tube placement. There is a new gas collection occupying the space of the pleural fluid. The pigtail catheter is within this gas collection. Small amount residual pleural fluid does remain at the RIGHT lung base. There is atelectasis within the RIGHT lower lobe. LEFT upper lobe is clear. The LEFT lung is mildly hyperexpanded. Upper Abdomen: Limited view of the liver, kidneys, pancreas are unremarkable. Normal adrenal glands.  Musculoskeletal: No aggressive osseous lesion. IMPRESSION: 1. New gas collection replaces the loculated pleural fluid in the RIGHT upper lobe (hydropneumothorax) following chest tube placement. The chest tube within the inferior aspect of this new hydropneumothorax. 2. Atelectasis within the RIGHT upper lobe. 3. LEFT lung clear. Electronically Signed   By: Suzy Bouchard M.D.   On: 07/19/2018 15:49   Ct Chest Wo Contrast  Result Date: 06/27/2018 CLINICAL DATA:  Shortness of Breath and abnormal chest x-ray EXAM: CT CHEST WITHOUT CONTRAST TECHNIQUE: Multidetector CT imaging of the chest was performed following the standard protocol without IV contrast. COMPARISON:  Chest x-ray from the previous day. FINDINGS: Cardiovascular: Somewhat limited due to the lack of IV contrast. The thoracic aorta demonstrates a normal branching pattern without aneurysmal dilatation or significant atherosclerotic calcifications. No cardiac enlargement is seen. Mild coronary calcifications are noted. Mediastinum/Nodes: The thoracic inlet is within normal limits. No significant hilar or mediastinal adenopathy is identified at this time. The esophagus is within normal limits. Lungs/Pleura: The left lung is well aerated without focal infiltrate or sizable effusion. On the right there is a large loculated pleural effusion identified with increased attenuation identified within. These changes are most consistent with an empyema. Some upper lobe consolidation is noted adjacent to the fluid collection. These changes are similar to that seen on recent chest x-ray but new from a prior exam of 12/30/2017 Upper Abdomen: Visualized upper abdomen is within normal limits. Musculoskeletal: Degenerative changes of the thoracic spine are noted. No definitive rib fracture is seen. IMPRESSION: Changes consistent with large loculated pleural effusion likely representing an empyema on the right. This would likely benefit from large bore chest tube  placement with possible lytic therapy. Associated right upper lobe consolidation is noted. These results will be called to the ordering clinician or representative by the Radiologist Assistant, and communication documented in the PACS or zVision Dashboard. Electronically Signed   By: Inez Catalina M.D.   On: 07/03/2018 10:23   US Renal  Result Date: 07/25/2018 CLINICAL DATA:  Acute kidney injury, acute renal failure EXAM: RENAL / URINARY TRACT ULTRASOUND COMPLETE COMPARISON:  None FINDINGS: Right Kidney: Length: 10.9 cm. Cortical thinning. Increased cortical echogenicity. No mass, hydronephrosis or shadowing calcification. Left Kidney: Length: 10.5 cm. Marked cortical thinning. Increased cortical echogenicity. No gross evidence of mass or hydronephrosis, less well visualized than RIGHT kidney due to body habitus and poor acoustic window Bladder: Decompressed by Foley catheter, unable to evaluate IMPRESSION: BILATERAL renal cortical atrophy and medical renal disease changes. No gross renal mass or hydronephrosis identified. Electronically Signed   By: Lavonia Dana  M.D.   On: 07/25/2018 12:51   US Venous Img Lower Bilateral  Result Date: 07/23/2018 CLINICAL DATA:  History of DVT and pulmonary embolism. Evaluate for acute or chronic DVT. EXAM: BILATERAL LOWER EXTREMITY VENOUS DOPPLER ULTRASOUND TECHNIQUE: Gray-scale sonography with graded compression, as well as color Doppler and duplex ultrasound were performed to evaluate the lower extremity deep venous systems from the level of the common femoral vein and including the common femoral, femoral, profunda femoral, popliteal and calf veins including the posterior tibial, peroneal and gastrocnemius veins when visible. The superficial great saphenous vein was also interrogated. Spectral Doppler was utilized to evaluate flow at rest and with distal augmentation maneuvers in the common femoral, femoral and popliteal veins. COMPARISON:  Bilateral lower extremity venous  Doppler ultrasound-03/25/2017 FINDINGS: Examination is degraded due to patient body habitus and poor sonographic window. RIGHT LOWER EXTREMITY Common Femoral Vein: No evidence of thrombus. Normal compressibility, respiratory phasicity and response to augmentation. Saphenofemoral Junction: No evidence of thrombus. Normal compressibility and flow on color Doppler imaging. Profunda Femoral Vein: No evidence of thrombus. Normal compressibility and flow on color Doppler imaging. Femoral Vein: No evidence of thrombus. Normal compressibility, respiratory phasicity and response to augmentation. Popliteal Vein: No evidence of thrombus. Normal compressibility, respiratory phasicity and response to augmentation. Calf Veins: No evidence of thrombus. Normal compressibility and flow on color Doppler imaging. Superficial Great Saphenous Vein: No evidence of thrombus. Normal compressibility. Venous Reflux:  None. Other Findings:  None. LEFT LOWER EXTREMITY Common Femoral Vein: No evidence of thrombus. Normal compressibility, respiratory phasicity and response to augmentation. Saphenofemoral Junction: No evidence of thrombus. Normal compressibility and flow on color Doppler imaging. Profunda Femoral Vein: No evidence of thrombus. Normal compressibility and flow on color Doppler imaging. Femoral Vein: No evidence of thrombus. Normal compressibility, respiratory phasicity and response to augmentation. Popliteal Vein: No evidence of thrombus. Normal compressibility, respiratory phasicity and response to augmentation. Calf Veins: No evidence of acute or chronic thrombus. Normal compressibility and flow on color Doppler imaging. Superficial Great Saphenous Vein: No evidence of thrombus. Normal compressibility. Venous Reflux:  None. Other Findings:  None. IMPRESSION: No evidence of acute or chronic DVT within either lower extremity with special attention paid to the left tibial veins. Electronically Signed   By: Sandi Mariscal M.D.   On:  07/23/2018 16:57   Dg Chest Port 1 View  Result Date: 07/27/2018 CLINICAL DATA:  Follow-up right-sided hemothorax. History of previous pulmonary embolism, MI. EXAM: PORTABLE CHEST 1 VIEW COMPARISON:  Portable chest x-ray of July 26, 2018 FINDINGS: There remains volume loss on the right. There is a moderate amount of pleural fluid along the convexity of the lung. No pneumothorax is evident. 3 right-sided chest tubes remain in position. There is slight shift of the mediastinum toward the right which is stable. The left lung exhibits minimal basilar density. The heart and pulmonary vascularity are normal. The right internal jugular venous catheter tip projects at the cavoatrial junction. The observed bony thorax is unremarkable. IMPRESSION: No pneumothorax. Persistent pleural fluid or pleural thickening along the convexity of the right lung. Stable appearance of the 3 right-sided chest tubes. Minimal atelectasis at the left lung base. Electronically Signed   By: David  Martinique M.D.   On: 07/27/2018 07:29   Dg Chest Port 1 View  Result Date: 07/26/2018 CLINICAL DATA:  Central line placement, history MI EXAM: PORTABLE CHEST 1 VIEW COMPARISON:  Portable exam 1459 hours compared to 07/26/2018 at 0637 hours FINDINGS: Three RIGHT thoracostomy tubes unchanged.  New large bore RIGHT jugular central venous catheter with tip projecting over RIGHT atrium. No pneumothorax. Stable heart size and mediastinal contours. RIGHT basilar atelectasis with persistent RIGHT pleural effusion. Minimal LEFT basilar atelectasis. IMPRESSION: No pneumothorax following RIGHT jugular line placement. Multiple RIGHT thoracostomy tubes with small RIGHT pleural effusion and RIGHT basilar atelectasis. Electronically Signed   By: Lavonia Dana M.D.   On: 07/26/2018 15:12   Dg Chest Port 1 View  Result Date: 07/26/2018 CLINICAL DATA:  Right chest tube EXAM: PORTABLE CHEST 1 VIEW COMPARISON:  07/25/2018 FINDINGS: Right chest tubes remain in place.  No pneumothorax. Continued right pleural effusion, patchy opacities throughout the right lung and left base atelectasis. Heart is normal size. IMPRESSION: No significant change since prior study.  No pneumothorax. Electronically Signed   By: Rolm Baptise M.D.   On: 07/26/2018 07:23   Dg Chest Port 1 View  Result Date: 07/25/2018 CLINICAL DATA:  Chest tube in place EXAM: PORTABLE CHEST 1 VIEW COMPARISON:  07/24/2018 FINDINGS: Three left chest tubes remain in place, unchanged. Moderate right pleural effusion. No pneumothorax. Right central line is unchanged. Patchy right lung and left basilar airspace opacities are stable. IMPRESSION: Stable right chest tubes without pneumothorax and right pleural effusion. Right lung and left basilar airspace opacities are stable. Electronically Signed   By: Rolm Baptise M.D.   On: 07/25/2018 07:40   Dg Chest Port 1 View  Result Date: 07/24/2018 CLINICAL DATA:  Chest tube in place. EXAM: PORTABLE CHEST 1 VIEW COMPARISON:  None. FINDINGS: Cardiomediastinal silhouette is unchanged in position. 3 right-sided chest tubes are noted. No definite pneumothorax is noted. Stable loculated right pleural effusion is noted. Stable bibasilar subsegmental atelectasis is noted. Minimal left pleural effusion is noted. Stable position of right internal jugular catheter with distal tip in right atrium. Bony thorax is unremarkable. IMPRESSION: Stable position of 3 right-sided chest tubes without pneumothorax. Stable loculated right pleural effusion. Stable bibasilar subsegmental atelectasis with minimal left pleural effusion. Stable right internal jugular catheter is noted with tip in right atrium. Electronically Signed   By: Marijo Conception, M.D.   On: 07/24/2018 07:20   Dg Chest Port 1 View  Result Date: 07/23/2018 CLINICAL DATA:  Chest tube EXAM: PORTABLE CHEST 1 VIEW COMPARISON:  08/13/2018 FINDINGS: Multiple chest tubes on the right remain in place. Diffuse right pleural  thickening/fluid unchanged. No pneumothorax. Mild right lower lobe atelectasis unchanged Right jugular central venous catheter tip extends into the upper right atrium unchanged. Left lung clear. Negative for edema. IMPRESSION: No change from the prior study. Multiple right chest tubes with mild right lower lobe atelectasis. No pneumothorax. Electronically Signed   By: Franchot Gallo M.D.   On: 07/23/2018 07:14   Dg Chest Port 1 View  Result Date: 07/27/2018 CLINICAL DATA:  Postop day 0 decortication of the RIGHT pleural space in this patient who has a chronic RIGHT hemothorax. EXAM: PORTABLE CHEST 1 VIEW COMPARISON:  CT chest 07/19/2018, 06/22/2018. Chest x-rays 07/19/2018 and earlier. FINDINGS: Resolution of the air in the LEFT pleural space post thoracotomy and decortication. Residual moderate pleural thickening and/or loculated pleural effusion/hemothorax. Three RIGHT chest tubes in place. Minimal subcutaneous emphysema in the RIGHT chest wall and RIGHT low neck. Improved aeration in the RIGHT lung, with residual passive atelectasis. LEFT lung remains clear. RIGHT jugular central venous catheter tip projects at or near the cavoatrial junction, unchanged. IMPRESSION: 1. Three RIGHT chest tubes in place post thoracotomy and decortication of the RIGHT pleural space.  Residual loculated RIGHT pleural effusion/hemothorax, but no evidence of residual pleural air. 2. Improved aeration in the RIGHT lung, with moderate passive atelectasis persisting. 3. LEFT lung remains clear. 4. Minimal subcutaneous emphysema in the RIGHT chest wall and RIGHT low neck. Electronically Signed   By: Evangeline Dakin M.D.   On: 08/05/2018 14:17   Dg Chest Port 1 View  Result Date: 07/19/2018 CLINICAL DATA:  Postop check. EXAM: PORTABLE CHEST 1 VIEW COMPARISON:  July 18, 2018 FINDINGS: A hydropneumothorax is now identified. The air component was not present on the July 18, 2018 study. A right chest tube remains with the  pigtail located peripherally. The tube is been pulled back somewhat in the interval. Stable right central line terminating near the caval atrial junction. No left-sided pneumothorax. No other change. Postoperative changes on the right. IMPRESSION: 1. There is a hydropneumothorax. The air component was not present yesterday. The chest tube is been pulled back but remains. Postoperative changes on the right. 2. No other changes. These results will be called to the ordering clinician or representative by the Radiologist Assistant, and communication documented in the PACS or zVision Dashboard. Electronically Signed   By: Dorise Bullion III M.D   On: 07/19/2018 14:15   Dg Chest Port 1 View  Result Date: 07/18/2018 CLINICAL DATA:  Right chest tube EXAM: PORTABLE CHEST 1 VIEW COMPARISON:  07/15/2018 FINDINGS: Right jugular central venous catheter placed. Tip is in the lower SVC. Right pigtail thoracostomy projects over the right upper lung zone. No pneumothorax. Stable right pleural effusion. IMPRESSION: New right thoracostomy. New right jugular central venous catheter. Tip is in the SVC without pneumothorax Stable right pleural effusion. Electronically Signed   By: Marybelle Killings M.D.   On: 07/18/2018 08:51   Dg Abd Portable 2v  Result Date: 07/25/2018 CLINICAL DATA:  Abdominal distention and pain EXAM: PORTABLE ABDOMEN - 2 VIEW COMPARISON:  None FINDINGS: Moderate to large stool burden throughout the colon with mild gaseous distention of the colon which may reflect ileus. No evidence of bowel obstruction. No free air or organomegaly. Right chest tubes partially imaged. IVC filter in place. IMPRESSION: Moderate to large stool burden with mild gaseous distention of the colon. This may reflect ileus. Electronically Signed   By: Rolm Baptise M.D.   On: 07/25/2018 07:19   Ct Image Guided Drainage By Percutaneous Catheter  Result Date: 06/21/2018 INDICATION: 67 year old with a loculated right hemothorax. Plan for  chest tube placement and intrapleural thrombolytics therapy. EXAM: CT-GUIDED PLACEMENT OF RIGHT CHEST TUBE MEDICATIONS: Sedation medications ANESTHESIA/SEDATION: 4.0 mg IV Versed 150 mcg IV Fentanyl Moderate Sedation Time:  27 minutes The patient was continuously monitored during the procedure by the interventional radiology nurse under my direct supervision. COMPLICATIONS: None immediate. TECHNIQUE: Informed written consent was obtained from the patient after a thorough discussion of the procedural risks, benefits and alternatives. All questions were addressed. Maximal Sterile Barrier Technique was utilized including caps, mask, sterile gowns, sterile gloves, sterile drape, hand hygiene and skin antiseptic. A timeout was performed prior to the initiation of the procedure. PROCEDURE: Patient was placed supine on the CT scanner with the right arm elevated. Images through the chest were obtained. The right mid axillary region was prepped and draped in a sterile fashion using chlorhexidine. Skin and soft tissues were anesthetized with 1% lidocaine. Initially, a 10 cm Yueh catheter was directed into the pleural space but this was too short. 15 cm ,18 gauge trocar needle was directed in the pleural space  with CT guidance and a small amount of brown fluid was aspirated. Stiff Amplatz wire was advanced into the pleural space. The tract was dilated to accommodate a 14 [place] multipurpose drain. Additional dark fluid was aspirated. The catheter was sutured to skin and attached to a PleurEvac device. FINDINGS: Complex right pleural effusion with thickened pleural wall. Dark brown fluid removed from the pleural space and compatible with old blood. Catheter is well positioned within the pleural space. Small pocket of gas adjacent to the chest tube is compatible with a complex or loculated collection. IMPRESSION: CT-guided placement of a 14 French right chest tube. Electronically Signed   By: Markus Daft M.D.   On: 07/02/2018  12:53   US Thoracentesis Asp Pleural Space W/img Guide  Result Date: 07/15/2018 Laverle Hobby, MD     07/20/2018  8:27 AM PROCEDURE NOTE: THORACENTESIS Date: 07/15/2018, MRN# [id number] [place] Indication: Right pleural effusion A time-out was completed verifying correct patient, procedure, site, positioning, and implant(s) or special equipment if applicable. Ultrasound guidance was used and appropriate fluid pocket was identified and marked.  Patient was positioned, prepped and draped in usual sterile fashion. 1% Lidocaine was used to anesthetize the area. Under ultrasound guidance a right upper lobe loculated pleural effusion was noted, this effusion appeared to be high density, considerably higher than a typical fluid density. The site was anesthetized with 1% topical lidocaine.  The thoracentesis kit was used, the trocar was introduced over the tube.  It was advanced until fluid was aspirated into the syringe, the fluid appeared to be grossly bloody.  Approximately 15 cc could be drained, further fluid could not be drained.  Therefore the procedure was stopped, obtained fluid was sent to the lab for evaluation. A chest xray was ordered to evaluate for pneumothorax. Total Fluid Removed: 15 cc of grossly bloody fluid. DDx: Loculated empyema vs. Hemothorax. --Will check CT chest. Patient tolerated the procedure well and there were no complications noted. Marda Stalker, M.D., F.C.C.P. Board Certified in Internal Medicine, Pulmonary Medicine, [place], and Sleep Medicine. [facility] Office Number: [phone]    Consults: Treatment Team:  Vickie Epley, MD Anthonette Legato, MD Evaristo Bury, MD Hermelinda Dellen, DO Allyne Gee, MD   Subjective:    Overnight Issues: Status post dialysis catheter placement and hemodialysis.  Patient doing well this morning, pending repeat hemodialysis today.  Required norepinephrine during  dialysis  Objective:  Vital signs for last 24 hours: Temp:  [89.3 F (31.8 C)-99.1 F (37.3 C)] 99.1 F (37.3 C) (10/07 0352) Pulse Rate:  [98-117] 98 (10/07 0605) Resp:  [18-32] 28 (10/07 0605) BP: (57-103)/(37-74) 83/57 (10/07 0605) SpO2:  [93 %-100 %] 94 % (10/07 0605) Weight:  [132.7 kg-133.8 kg] 133.8 kg (10/07 0425)  Hemodynamic parameters for last 24 hours:    Intake/Output from previous day: 10/06 0701 - 10/07 0700 In: 3616.5 [I.V.:3116.5; IV Piggyback:500] Out: -143 [Urine:10; Chest Tube:47]  Intake/Output this shift: No intake/output data recorded.  Vent settings for last 24 hours:    Physical Exam:  Vital signs:       Please see the above listed vital signs HEENT:           Trachea is midline, right IJ central line HD TriCath, no oral lesions appreciated, trachea is midline Cardiovascular:           Regular rate and rhythm, tachycardia Pulmonary:      Diminished breath sounds on the right, chest tubes  noted, no clear air leak noted Abdominal:      Positive bowel sounds, soft exam Extremities:     Lower extremity edema appreciated bilaterally Neurologic:      Cranial nerves are grossly intact without any focal deficits appreciated  Assessment/Plan:   Patient status post thoracotomy, decortication, evacuation of hematoma and chest tube placement.  Doing well from pulmonary point of view  Patient with acute renal failure, hyperkalemia, requiring hemodialysis.  [facility] post hemodialysis.  Pending dialysis today.  This morning's potassium is 4.8, BUN 55, creatinine 6.2  leukocytosis there is no clear evidence of infection  Anemia.  No active bleeding appreciated  Critical care time 30 minutes   Dennie Moltz 07/27/2018  *Care during the described time interval was provided by me and/or other providers on the critical care team.  I have reviewed this patient's available data, including medical history, events of note, physical examination and test results as  part of my evaluation.

## 2018-07-27 NOTE — Consult Note (Signed)
[facility] TeleNeurology Consult Services   Date of Service:   07/27/2018 22:44:53  Impression:     .  RO Acute Ischemic Stroke     .  cannot clinically rule out if the preceding event was an ischemic stroke or cardiac etiology.  Comments: upon logging in to camera, I was notified that patient was severely bradycardic and (almost had a cardiac arrest) he was unresponsive and not moving extremities (even to noxious stimuli), no forced lateral gaze deviation. he received fentanyl and etomidate prior to intubation. NIHSS could not be completed due to patient's critical status;   Metrics: Last Known Well: 07/27/2018 22:22:00 [facility] Notification Time: 07/27/2018 22:44:12 Stamp Time: 07/27/2018 22:44:53 Time First Login Attempt: 07/27/2018 22:50:08 Video Start Time: 07/27/2018 22:50:08  Symptoms: unresponsive Patient is not a candidate for tPA. Patient was not deemed candidate for tPA thrombolytics because of recent extensive surgery. Video End Time: 07/27/2018 23:05:47  Our recommendations are outlined below.  Recommendations:     .  rectal aspirin if no contraindication he is not an IV tPA candidate due to recent surgery, once stable can obtain a STAT head CT to rule out hemorrhage and STAT head and neck CTA to rule out Large Vessel Occlusion. later, will need routine noncontrast brain MRI.   Routine Consultation with Inhouse Neurology for Follow up Care  Sign Out:     .  Discussed with Rapid Response Team    ------------------------------------------------------------------------------  History of Present Illness: Patient is a 67 years old Male.  Inpatient stroke alert was called for symptoms of unresponsive  Patient is a(n) 67 years old male , with history of renal failure, recent IVC placement, thoracotomy (for persistent pleural effusion and empyema) last known well: 22:22 at 22:30 was found unresponsive and anisocoria, severely  bradycardic  Last seen normal was within 4.5 hours. There is no history of hemorrhagic complications or intracranial hemorrhage. There is no history of Recent Anticoagulants. There is no history of recent major surgery. There is no history of recent stroke.  Examination: NIHSS cannot be completed due to patient status.  Patient was informed the Neurology Consult would happen via TeleHealth consult by way of interactive audio and video telecommunications and consented to receiving care in this manner.  Due to the immediate potential for life-threatening deterioration due to underlying acute neurologic illness, I spent 35 minutes providing critical care. This time includes time for face to face visit via telemedicine, review of medical records, imaging studies and discussion of findings with providers, the patient and/or family.   Dr Driscilla Grammes   [facility] [phone]

## 2018-07-27 NOTE — Progress Notes (Signed)
   07/27/18 1600  Clinical Encounter Type  Visited With Patient and family together  Visit Type Spiritual support  Recommendations Follow-up as needed.  Spiritual Encounters  Spiritual Needs Emotional;Prayer  Stress Factors  Patient Stress Factors Health changes   Chaplain engaged patient and family after an invitation by the patient's wife to visit. After speaking with the patient and assessing the situation, Chaplain left to retrieve a prayer blanket, which was given to the patient. Chaplain anointed the blanket and prayed for the family. Chaplain spoke briefly to the patient and offered a blessing of health. The family responded favorably, but there is deep concern about the patient's rapidly changing status.

## 2018-07-27 NOTE — Progress Notes (Addendum)
Care assumed from Tami/Myra at approx 1145. CRRT initiated at 1335 w/ no issues.  MAP goal is 65, levophed is at 40 mcg to maintain. MAP is 67 at this time.

## 2018-07-28 ENCOUNTER — Inpatient Hospital Stay: Payer: Medicare PPO

## 2018-07-28 ENCOUNTER — Other Ambulatory Visit: Payer: Self-pay

## 2018-07-28 DIAGNOSIS — Z9689 Presence of other specified functional implants: Secondary | ICD-10-CM

## 2018-07-28 DIAGNOSIS — R4182 Altered mental status, unspecified: Secondary | ICD-10-CM

## 2018-07-28 DIAGNOSIS — Z7189 Other specified counseling: Secondary | ICD-10-CM

## 2018-07-28 DIAGNOSIS — Z515 Encounter for palliative care: Secondary | ICD-10-CM

## 2018-07-28 DIAGNOSIS — J9601 Acute respiratory failure with hypoxia: Secondary | ICD-10-CM

## 2018-07-28 LAB — GLUCOSE, CAPILLARY
GLUCOSE-CAPILLARY: 120 mg/dL — AB (ref 70–99)
GLUCOSE-CAPILLARY: 141 mg/dL — AB (ref 70–99)
GLUCOSE-CAPILLARY: 157 mg/dL — AB (ref 70–99)
GLUCOSE-CAPILLARY: 179 mg/dL — AB (ref 70–99)
GLUCOSE-CAPILLARY: 190 mg/dL — AB (ref 70–99)
GLUCOSE-CAPILLARY: 78 mg/dL (ref 70–99)
Glucose-Capillary: 137 mg/dL — ABNORMAL HIGH (ref 70–99)
Glucose-Capillary: 138 mg/dL — ABNORMAL HIGH (ref 70–99)
Glucose-Capillary: 140 mg/dL — ABNORMAL HIGH (ref 70–99)
Glucose-Capillary: 167 mg/dL — ABNORMAL HIGH (ref 70–99)
Glucose-Capillary: 183 mg/dL — ABNORMAL HIGH (ref 70–99)
Glucose-Capillary: 208 mg/dL — ABNORMAL HIGH (ref 70–99)
Glucose-Capillary: 73 mg/dL (ref 70–99)

## 2018-07-28 LAB — CBC WITH DIFFERENTIAL/PLATELET
BAND NEUTROPHILS: 1 %
BASOS ABS: 0 10*3/uL (ref 0–0.1)
BASOS PCT: 0 %
Blasts: 0 %
EOS ABS: 0 10*3/uL (ref 0–0.7)
Eosinophils Relative: 0 %
HCT: 27.8 % — ABNORMAL LOW (ref 40.0–52.0)
Hemoglobin: 8.4 g/dL — ABNORMAL LOW (ref 13.0–18.0)
LYMPHS ABS: 3.2 10*3/uL (ref 1.0–3.6)
Lymphocytes Relative: 9 %
MCH: 30.2 pg (ref 26.0–34.0)
MCHC: 30.3 g/dL — AB (ref 32.0–36.0)
MCV: 99.7 fL (ref 80.0–100.0)
METAMYELOCYTES PCT: 3 %
MONO ABS: 1.8 10*3/uL — AB (ref 0.2–1.0)
MYELOCYTES: 2 %
Monocytes Relative: 5 %
Neutro Abs: 31.1 10*3/uL — ABNORMAL HIGH (ref 1.4–6.5)
Neutrophils Relative %: 80 %
Other: 0 %
PLATELETS: 113 10*3/uL — AB (ref 150–440)
Promyelocytes Relative: 0 %
RBC: 2.79 MIL/uL — AB (ref 4.40–5.90)
RDW: 14.9 % — AB (ref 11.5–14.5)
WBC: 36.1 10*3/uL — AB (ref 3.8–10.6)
nRBC: 9 /100 WBC — ABNORMAL HIGH

## 2018-07-28 LAB — RENAL FUNCTION PANEL
ALBUMIN: 1.8 g/dL — AB (ref 3.5–5.0)
ALBUMIN: 1.4 g/dL — AB (ref 3.5–5.0)
ALBUMIN: 1.3 g/dL — AB (ref 3.5–5.0)
ANION GAP: 27 — AB (ref 5–15)
ANION GAP: 21 — AB (ref 5–15)
Albumin: 2.6 g/dL — ABNORMAL LOW (ref 3.5–5.0)
Albumin: 1.6 g/dL — ABNORMAL LOW (ref 3.5–5.0)
Albumin: 1.5 g/dL — ABNORMAL LOW (ref 3.5–5.0)
Anion gap: 24 — ABNORMAL HIGH (ref 5–15)
Anion gap: 25 — ABNORMAL HIGH (ref 5–15)
Anion gap: 23 — ABNORMAL HIGH (ref 5–15)
Anion gap: 20 — ABNORMAL HIGH (ref 5–15)
BUN: 54 mg/dL — ABNORMAL HIGH (ref 8–23)
BUN: 58 mg/dL — ABNORMAL HIGH (ref 8–23)
BUN: 52 mg/dL — ABNORMAL HIGH (ref 8–23)
BUN: 46 mg/dL — AB (ref 8–23)
BUN: 38 mg/dL — AB (ref 8–23)
BUN: 29 mg/dL — ABNORMAL HIGH (ref 8–23)
CALCIUM: 8.4 mg/dL — AB (ref 8.9–10.3)
CALCIUM: 7.2 mg/dL — AB (ref 8.9–10.3)
CALCIUM: 7.3 mg/dL — AB (ref 8.9–10.3)
CALCIUM: 7.1 mg/dL — AB (ref 8.9–10.3)
CHLORIDE: 99 mmol/L (ref 98–111)
CO2: 13 mmol/L — AB (ref 22–32)
CO2: 16 mmol/L — AB (ref 22–32)
CO2: 16 mmol/L — ABNORMAL LOW (ref 22–32)
CO2: 13 mmol/L — AB (ref 22–32)
CO2: 13 mmol/L — ABNORMAL LOW (ref 22–32)
CO2: 15 mmol/L — AB (ref 22–32)
CREATININE: 6.2 mg/dL — AB (ref 0.61–1.24)
CREATININE: 6.38 mg/dL — AB (ref 0.61–1.24)
CREATININE: 6.02 mg/dL — AB (ref 0.61–1.24)
CREATININE: 5.35 mg/dL — AB (ref 0.61–1.24)
CREATININE: 3.6 mg/dL — AB (ref 0.61–1.24)
Calcium: 7.4 mg/dL — ABNORMAL LOW (ref 8.9–10.3)
Calcium: 7 mg/dL — ABNORMAL LOW (ref 8.9–10.3)
Chloride: 98 mmol/L (ref 98–111)
Chloride: 99 mmol/L (ref 98–111)
Chloride: 102 mmol/L (ref 98–111)
Chloride: 100 mmol/L (ref 98–111)
Chloride: 102 mmol/L (ref 98–111)
Creatinine, Ser: 4.66 mg/dL — ABNORMAL HIGH (ref 0.61–1.24)
GFR calc Af Amer: 12 mL/min — ABNORMAL LOW (ref >60)
GFR calc Af Amer: 19 mL/min — ABNORMAL LOW (ref >60)
GFR calc non Af Amer: 8 mL/min — ABNORMAL LOW (ref >60)
GFR calc non Af Amer: 8 mL/min — ABNORMAL LOW (ref >60)
GFR calc non Af Amer: 16 mL/min — ABNORMAL LOW (ref >60)
GFR, EST AFRICAN AMERICAN: 10 mL/min — AB (ref >60)
GFR, EST AFRICAN AMERICAN: 9 mL/min — AB (ref >60)
GFR, EST AFRICAN AMERICAN: 10 mL/min — AB (ref >60)
GFR, EST AFRICAN AMERICAN: 14 mL/min — AB (ref >60)
GFR, EST NON AFRICAN AMERICAN: 9 mL/min — AB (ref >60)
GFR, EST NON AFRICAN AMERICAN: 10 mL/min — AB (ref >60)
GFR, EST NON AFRICAN AMERICAN: 12 mL/min — AB (ref >60)
GLUCOSE: 161 mg/dL — AB (ref 70–99)
Glucose, Bld: <20 mg/dL — CL (ref 70–99)
Glucose, Bld: 280 mg/dL — ABNORMAL HIGH (ref 70–99)
Glucose, Bld: 184 mg/dL — ABNORMAL HIGH (ref 70–99)
Glucose, Bld: 147 mg/dL — ABNORMAL HIGH (ref 70–99)
Glucose, Bld: 144 mg/dL — ABNORMAL HIGH (ref 70–99)
PHOSPHORUS: 10.9 mg/dL — AB (ref 2.5–4.6)
POTASSIUM: 7.1 mmol/L — AB (ref 3.5–5.1)
Phosphorus: 12.4 mg/dL — ABNORMAL HIGH (ref 2.5–4.6)
Phosphorus: 11.9 mg/dL — ABNORMAL HIGH (ref 2.5–4.6)
Phosphorus: 12.5 mg/dL — ABNORMAL HIGH (ref 2.5–4.6)
Phosphorus: 11.9 mg/dL — ABNORMAL HIGH (ref 2.5–4.6)
Phosphorus: 9.6 mg/dL — ABNORMAL HIGH (ref 2.5–4.6)
Potassium: >7.5 mmol/L (ref 3.5–5.1)
Potassium: 5.7 mmol/L — ABNORMAL HIGH (ref 3.5–5.1)
Potassium: 6.1 mmol/L — ABNORMAL HIGH (ref 3.5–5.1)
Potassium: 6.9 mmol/L (ref 3.5–5.1)
Potassium: 6.3 mmol/L (ref 3.5–5.1)
SODIUM: 138 mmol/L (ref 135–145)
SODIUM: 136 mmol/L (ref 135–145)
SODIUM: 139 mmol/L (ref 135–145)
SODIUM: 140 mmol/L (ref 135–145)
SODIUM: 137 mmol/L (ref 135–145)
Sodium: 136 mmol/L (ref 135–145)

## 2018-07-28 LAB — BLOOD GAS, ARTERIAL
Acid-base deficit: 18.9 mmol/L — ABNORMAL HIGH (ref 0.0–2.0)
Acid-base deficit: 21.6 mmol/L — ABNORMAL HIGH (ref 0.0–2.0)
Bicarbonate: 10.3 mmol/L — ABNORMAL LOW (ref 20.0–28.0)
Bicarbonate: 12.1 mmol/L — ABNORMAL LOW (ref 20.0–28.0)
FIO2: 1
FIO2: 100
MECHANICAL RATE: 20
MECHVT: 550 mL
O2 Saturation: 65.2 %
O2 Saturation: 99.1 %
PATIENT TEMPERATURE: 37
PCO2 ART: 47 mmHg (ref 32.0–48.0)
PEEP: 5 cmH2O
PEEP: 5 cmH2O
PO2 ART: 56 mmHg — AB (ref 83.0–108.0)
Patient temperature: 37
RATE: 25 resp/min
VT: 550 mL
pCO2 arterial: 48 mmHg (ref 32.0–48.0)
pH, Arterial: 6.95 — CL (ref 7.350–7.450)
pH, Arterial: 7.01 — CL (ref 7.350–7.450)
pO2, Arterial: 190 mmHg — ABNORMAL HIGH (ref 83.0–108.0)

## 2018-07-28 LAB — MAGNESIUM
MAGNESIUM: 2.3 mg/dL (ref 1.7–2.4)
Magnesium: 2.5 mg/dL — ABNORMAL HIGH (ref 1.7–2.4)
Magnesium: 2.3 mg/dL (ref 1.7–2.4)
Magnesium: 2.3 mg/dL (ref 1.7–2.4)
Magnesium: 2.1 mg/dL (ref 1.7–2.4)

## 2018-07-28 LAB — BASIC METABOLIC PANEL
ANION GAP: 28 — AB (ref 5–15)
BUN: 57 mg/dL — ABNORMAL HIGH (ref 8–23)
CHLORIDE: 98 mmol/L (ref 98–111)
CO2: 14 mmol/L — ABNORMAL LOW (ref 22–32)
Calcium: 7.2 mg/dL — ABNORMAL LOW (ref 8.9–10.3)
Creatinine, Ser: 6.49 mg/dL — ABNORMAL HIGH (ref 0.61–1.24)
GFR calc Af Amer: 9 mL/min — ABNORMAL LOW (ref >60)
GFR calc non Af Amer: 8 mL/min — ABNORMAL LOW (ref >60)
GLUCOSE: 224 mg/dL — AB (ref 70–99)
POTASSIUM: 5.9 mmol/L — AB (ref 3.5–5.1)
Sodium: 140 mmol/L (ref 135–145)

## 2018-07-28 LAB — HEPATIC FUNCTION PANEL
ALBUMIN: 1.6 g/dL — AB (ref 3.5–5.0)
ALK PHOS: 104 U/L (ref 38–126)
ALT: 671 U/L — AB (ref 0–44)
AST: 1047 U/L — ABNORMAL HIGH (ref 15–41)
Bilirubin, Direct: 0.7 mg/dL — ABNORMAL HIGH (ref 0.0–0.2)
Indirect Bilirubin: 0.9 mg/dL (ref 0.3–0.9)
TOTAL PROTEIN: 4.4 g/dL — AB (ref 6.5–8.1)
Total Bilirubin: 1.6 mg/dL — ABNORMAL HIGH (ref 0.3–1.2)

## 2018-07-28 LAB — PROCALCITONIN
PROCALCITONIN: 4.64 ng/mL
PROCALCITONIN: 4.55 ng/mL

## 2018-07-28 LAB — HEPATITIS B SURFACE ANTIGEN: Hepatitis B Surface Ag: NEGATIVE

## 2018-07-28 LAB — C3 COMPLEMENT: C3 COMPLEMENT: 171 mg/dL — AB (ref 82–167)

## 2018-07-28 LAB — ANA W/REFLEX IF POSITIVE: ANA: NEGATIVE

## 2018-07-28 LAB — TROPONIN I
TROPONIN I: 0.1 ng/mL — AB (ref <0.03)
TROPONIN I: 0.12 ng/mL — AB (ref <0.03)

## 2018-07-28 LAB — LACTIC ACID, PLASMA
LACTIC ACID, VENOUS: 17.8 mmol/L — AB (ref 0.5–1.9)
Lactic Acid, Venous: 16.6 mmol/L (ref 0.5–1.9)

## 2018-07-28 LAB — HEPATITIS B SURFACE ANTIBODY, QUANTITATIVE

## 2018-07-28 LAB — PHOSPHORUS: Phosphorus: 11.6 mg/dL — ABNORMAL HIGH (ref 2.5–4.6)

## 2018-07-28 LAB — APTT: APTT: 58 s — AB (ref 24–36)

## 2018-07-28 LAB — PATHOLOGIST SMEAR REVIEW

## 2018-07-28 LAB — MPO/PR-3 (ANCA) ANTIBODIES

## 2018-07-28 LAB — PARATHYROID HORMONE, INTACT (NO CA): PTH: 230 pg/mL — AB (ref 15–65)

## 2018-07-28 LAB — GLOMERULAR BASEMENT MEMBRANE ANTIBODIES: GBM AB: 2 U (ref 0–20)

## 2018-07-28 LAB — C4 COMPLEMENT: COMPLEMENT C4, BODY FLUID: 58 mg/dL — AB (ref 14–44)

## 2018-07-28 MED ORDER — EPINEPHRINE PF 1 MG/ML IJ SOLN
0.5000 ug/min | INTRAVENOUS | Status: DC
  Administered 2018-07-28 – 2018-07-29 (×7): 20 ug/min via INTRAVENOUS
  Filled 2018-07-28 (×8): qty 4

## 2018-07-28 MED ORDER — DEXTROSE 50 % IV SOLN
1.0000 | Freq: Once | INTRAVENOUS | Status: AC
  Administered 2018-07-28: 50 mL via INTRAVENOUS
  Filled 2018-07-28: qty 50

## 2018-07-28 MED ORDER — PIPERACILLIN-TAZOBACTAM 3.375 G IVPB
3.3750 g | Freq: Two times a day (BID) | INTRAVENOUS | Status: DC
  Administered 2018-07-28 (×2): 3.375 g via INTRAVENOUS
  Filled 2018-07-28 (×2): qty 50

## 2018-07-28 MED ORDER — INSULIN ASPART 100 UNIT/ML IV SOLN
10.0000 [IU] | Freq: Once | INTRAVENOUS | Status: AC
  Administered 2018-07-28: 10 [IU] via INTRAVENOUS
  Filled 2018-07-28: qty 0.1

## 2018-07-28 MED ORDER — ACETAMINOPHEN 325 MG PO TABS: 650.0000 mg | ORAL_TABLET | Freq: Four times a day (QID) | ORAL | Status: DC | PRN

## 2018-07-28 MED ORDER — DEXTROSE 50 % IV SOLN
INTRAVENOUS | Status: AC
  Administered 2018-07-28: 50 mL via INTRAVENOUS
  Filled 2018-07-28: qty 50

## 2018-07-28 MED ORDER — PIPERACILLIN-TAZOBACTAM 3.375 G IVPB
3.3750 g | Freq: Three times a day (TID) | INTRAVENOUS | Status: DC
  Administered 2018-07-28: 3.375 g via INTRAVENOUS
  Filled 2018-07-28: qty 50

## 2018-07-28 MED ORDER — PUREFLOW DIALYSIS SOLUTION
INTRAVENOUS | Status: DC
  Administered 2018-07-28 (×2): via INTRAVENOUS_CENTRAL

## 2018-07-28 MED ORDER — FOLIC ACID 1 MG PO TABS: 1.0000 mg | ORAL_TABLET | Freq: Every day | ORAL | Status: DC

## 2018-07-28 MED ORDER — ORAL CARE MOUTH RINSE
15.0000 mL | OROMUCOSAL | Status: DC
  Administered 2018-07-28 (×6): 15 mL via OROMUCOSAL

## 2018-07-28 MED ORDER — HYDROCORTISONE NA SUCCINATE PF 100 MG IJ SOLR
50.0000 mg | Freq: Four times a day (QID) | INTRAMUSCULAR | Status: DC
  Administered 2018-07-28 (×3): 50 mg via INTRAVENOUS
  Filled 2018-07-28 (×3): qty 2

## 2018-07-28 MED ORDER — SODIUM BICARBONATE 8.4 % IV SOLN
100.0000 meq | Freq: Once | INTRAVENOUS | Status: AC
  Administered 2018-07-28: 100 meq via INTRAVENOUS
  Filled 2018-07-28: qty 100

## 2018-07-28 MED ORDER — FAMOTIDINE IN NACL 20-0.9 MG/50ML-% IV SOLN
20.0000 mg | INTRAVENOUS | Status: DC
  Administered 2018-07-28 – 2018-07-29 (×2): 20 mg via INTRAVENOUS
  Filled 2018-07-28 (×2): qty 50

## 2018-07-28 MED ORDER — DEXTROSE 50 % IV SOLN
1.0000 | Freq: Once | INTRAVENOUS | Status: AC
  Administered 2018-07-28: 50 mL via INTRAVENOUS

## 2018-07-28 MED ORDER — HYDROCORTISONE NA SUCCINATE PF 100 MG IJ SOLR
50.0000 mg | Freq: Three times a day (TID) | INTRAMUSCULAR | Status: DC
  Administered 2018-07-28: 50 mg via INTRAVENOUS
  Filled 2018-07-28: qty 2

## 2018-07-28 MED ORDER — ALBUTEROL SULFATE (2.5 MG/3ML) 0.083% IN NEBU
10.0000 mg | INHALATION_SOLUTION | Freq: Once | RESPIRATORY_TRACT | Status: AC
  Administered 2018-07-28: 10 mg via RESPIRATORY_TRACT
  Filled 2018-07-28: qty 12

## 2018-07-28 MED ORDER — CHLORHEXIDINE GLUCONATE 0.12% ORAL RINSE (MEDLINE KIT)
15.0000 mL | Freq: Two times a day (BID) | OROMUCOSAL | Status: DC
  Administered 2018-07-28 (×2): 15 mL via OROMUCOSAL

## 2018-07-28 MED ORDER — DEXTROSE 50 % IV SOLN
INTRAVENOUS | Status: AC
  Administered 2018-07-28: 50 mL
  Filled 2018-07-28: qty 50

## 2018-07-28 MED FILL — Medication: Qty: 1 | Status: AC

## 2018-07-28 NOTE — Progress Notes (Signed)
Patient ID: Jeff Leonard, male   DOB: [birth year], 68 y.o.   MRN: [id number]   Events of last evening/night relayed to me by nursing staff.  Apparent stroke with acute mental status change, hypotension and bradycardia.  Now intubated and unresponsive to stimuli.  Hypothermic.   I did not examine him but his chest tubes do not have an air leak.    I did discuss the events as best as I could with the wife and family.  Told them he had an apparent stroke but he was too unstable for CT scan.    Will continue to follow and provide support to family.  [employer].

## 2018-07-28 NOTE — Progress Notes (Signed)
MEDICATION RELATED CONSULT NOTE - FOLLOW UP   Pharmacy Consult for Medication Review Indication: CRRT review  No Known Allergies  Labs: Recent Labs    07/26/18 0424  07/27/18 0400  07/27/18 2304 07/28/18 0234 07/28/18 0430 07/28/18 0455 07/28/18 0630 07/28/18 0843 07/28/18 1154  WBC  --   --  21.9*  --  36.1*  --   --   --   --   --   --   HGB  --   --  8.5*  --  8.4*  --   --   --   --   --   --   HCT  --   --  26.3*  --  27.8*  --   --   --   --   --   --   PLT  --   --  126*  --  113*  --   --   --   --   --   --   APTT  --   --   --   --  56*  --   --  58*  --   --   --   CREATININE 5.45*   < > 6.20*   < > 6.20*  --  6.38*  --  6.49* 6.02*  --   MG  --    < > 1.8   < > 2.6*  --   --  2.5*  --  2.3 2.3  PHOS  --    < >  --    < > 12.4* 11.6* 11.9*  --   --  12.5*  --   ALBUMIN 3.2*   < > 2.7*   < > 2.6*  --  1.8*  --  1.6* 1.6*  --   PROT 7.4  --  7.0  --   --   --   --   --  4.4*  --   --   AST 33  --  25  --   --   --   --   --  1,047*  --   --   ALT 21  --  17  --   --   --   --   --  671*  --   --   ALKPHOS 50  --  45  --   --   --   --   --  104  --   --   BILITOT 1.1  --  1.2  --   --   --   --   --  1.6*  --   --   BILIDIR  --   --   --   --   --   --   --   --  0.7*  --   --   IBILI  --   --   --   --   --   --   --   --  0.9  --   --    < > = values in this interval not displayed.   Estimated Creatinine Clearance: 17.3 mL/min (A) (by C-G formula based on SCr of 6.02 mg/dL (H)).   Assessment: Patient is a 67yo male admitted for hemothorax. Has been started on CRRT, pharmacy consulted for daily medication review.  Plan:  No adjustments warranted at this time. Will continue to monitor medications daily.   Mauri Reading, PharmD Pharmacy Resident  07/28/2018 2:44 PM

## 2018-07-28 NOTE — Procedures (Signed)
ELECTROENCEPHALOGRAM REPORT   Patient: Jeff Leonard       Room #: IC09A-AA EEG No. ID: [id number] Age: 67 y.o.        Sex: male Referring Physician: Conforti Report Date:  07/28/2018        Interpreting Physician: Thana Farr  History: Jeff Leonard is an 67 y.o. male unresponsive s/p arrest  Medications:  Pepcid, Fentanyl, Folic Acid, Solucortef, Zosyn, B6, Thiamine, Adrenalin, Levophed, Neosynephrine, Pitressin  Conditions of Recording:  This is a 21 channel routine scalp EEG performed with bipolar and monopolar montages arranged in accordance to the international 10/20 system of electrode placement. One channel was dedicated to EKG recording.  The patient is in the intubated and unresponsive state.  Description:  The background activity is slow and poorly organized.  It consists of a low voltage, polymorphic delta activity that is diffusely distributed and continuous.  Voltage is slightly higher on the left as compared to the right.  Despite increase in sensitivity to 5uV/mm there were no faster rhythms appreciated.  The patient was stimulated with no change in background rhythm noted.    Hyperventilation and intermittent photic stimulation were not performed.  IMPRESSION: This is an abnormal EEG secondary to attenuated general background slowing.  This finding may be seen with a diffuse disturbance that is etiologically nonspecific, but may include a metabolic encephalopathy, among other possibilities.  No epileptiform activity was noted.     Thana Farr, MD Neurology [phone] 07/28/2018, 3:27 PM

## 2018-07-28 NOTE — Procedures (Signed)
Endotracheal Intubation Procedure Note  Indication for endotracheal intubation: respiratory failure. Airway Assessment: Mallampati Class: II (hard and soft palate, upper portion of tonsils anduvula visible). Sedation: etomidate and fentanyl. Paralytic: rocuronium. Lidocaine: no. Atropine: no. Equipment: glidescope utilized . Cricoid Pressure: no. Number of attempts: 1. ETT location confirmed by by auscultation, by CXR and ETCO2 monitor.  Pt bradycardic hr 30's and in respiratory distress requiring emergent mechanical intubation.  [name], AGNP  Pulmonary/Critical Care Pager [phone] (please enter 7 digits) PCCM Consult Pager [phone] (please enter 7 digits)

## 2018-07-28 NOTE — Consult Note (Signed)
Pharmacy Antibiotic Note  Jeff Leonard is a 67 y.o. male admitted on 06/25/2018 with pneumonia.  Pharmacy has been consulted for Zosyn dosing.  Plan: Increase Zosyn from 3.375 g IV q12h to 3.375 g IV q8h (extended infusion).  Height: 6\' 1"  (185.4 cm) Weight: 294 lb 15.6 oz (133.8 kg) IBW/kg (Calculated) : 79.9  Temp (24hrs), Avg:91.4 F (33 C), Min:86.2 F (30.1 C), Max:98.2 F (36.8 C)  Recent Labs  Lab 07/23/18 0325 07/24/18 0403 07/25/18 0605  07/27/18 0243 07/27/18 0400  07/27/18 2010 07/27/18 2304 07/28/18 0430 07/28/18 0630 07/28/18 0843  WBC 14.3* 14.0* 16.9*  --   --  21.9*  --   --  36.1*  --   --   --   CREATININE 0.98  --  3.50*   < >  --  6.20*   < > 5.63* 6.20* 6.38* 6.49* 6.02*  LATICACIDVEN  --   --   --   --  16.6*  --   --   --   --   --   --  17.8*   < > = values in this interval not displayed.    Estimated Creatinine Clearance: 17.3 mL/min (A) (by C-G formula based on SCr of 6.02 mg/dL (H)).    No Known Allergies  Antimicrobials this admission: 9/27, 10/3, 10/2 Cefazolin surgical prophylaxis 10/8 Zosyn >>   Dose adjustments this admission: Zosyn  Microbiology results: 10/8 BCx: NG < 1 days 10/8 Sputum: rare WBCs, GPCs  10/2 MRSA PCR: (-) 10/2 Tissue Cx few WBCs/organisms 9/27 WoundC x rare WBCs  Thank you for allowing pharmacy to be a part of this patient's care.   Mauri Reading, PharmD Pharmacy Resident  07/28/2018 2:38 PM

## 2018-07-28 NOTE — Procedures (Signed)
Central Venous Catheter Insertion Procedure Note Jeff Leonard [id number] 1951/05/23  Procedure: Insertion of Central Venous Catheter Indications: Assessment of intravascular volume, Drug and/or fluid administration and Frequent blood sampling  Procedure Details Consent: Risks of procedure as well as the alternatives and risks of each were explained to the (patient/caregiver).  Consent for procedure obtained. Time Out: Verified patient identification, verified procedure, site/side was marked, verified correct patient position, special equipment/implants available, medications/allergies/relevent history reviewed, required imaging and test results available.  Performed  Maximum sterile technique was used including antiseptics, cap, gloves, gown, hand hygiene, mask and sheet. Skin prep: Chlorhexidine; local anesthetic administered A antimicrobial bonded/coated triple lumen catheter was placed in the left internal jugular vein using the Seldinger technique.  Evaluation Blood flow good Complications: No apparent complications Patient did tolerate procedure well. Chest X-ray ordered to verify placement.  CXR: pending.  Left internal jugular CVL placed utilizing ultrasound no complications noted during or following procedure.  Jeff Leonard, AGNP  Pulmonary/Critical Care Pager [phone] (please enter 7 digits) PCCM Consult Pager [phone] (please enter 7 digits)

## 2018-07-28 NOTE — Progress Notes (Signed)
CRRT restarted at 0745.  BP unable to tolerate 300 ml/min bloodflow, currently running at 150 ml/min.  ALine MAPs in 40s with all four pressors running at maximum.  Levo is running over 40 mcg max, running at 60 mcgs.  Esoph temp probe inserted, Temp fluct between 30-32 Celsius.  O2 sensor unable to pick up O2 sats consistently.  CCMD aware.

## 2018-07-28 NOTE — Progress Notes (Signed)
Pharmacy Antibiotic Note  Jeff Leonard is a 67 y.o. male admitted on 08-Aug-2018 with pneumonia.  Pharmacy has been consulted for zosyn dosing.  Plan: Zosyn 3.375g IV q8h (4 hour infusion).  Height: 6\' 1"  (185.4 cm) Weight: 294 lb 15.6 oz (133.8 kg) IBW/kg (Calculated) : 79.9  Temp (24hrs), Avg:98.5 F (36.9 C), Min:97.2 F (36.2 C), Max:99.2 F (37.3 C)  Recent Labs  Lab 07/23/18 0325 07/24/18 0403 07/25/18 0605  07/27/18 0400 07/27/18 1350 07/27/18 1603 07/27/18 2010 07/27/18 2304  WBC 14.3* 14.0* 16.9*  --  21.9*  --   --   --  36.1*  CREATININE 0.98  --  3.50*   < > 6.20* 6.36* 6.10* 5.63* 6.20*   < > = values in this interval not displayed.    Estimated Creatinine Clearance: 16.8 mL/min (A) (by C-G formula based on SCr of 6.2 mg/dL (H)).    No Known Allergies   Thank you for allowing pharmacy to be a part of this patient's care.  Thomasene Ripple, PharmD, BCPS Clinical Pharmacist 07/28/2018

## 2018-07-28 NOTE — Progress Notes (Signed)
Rate changed per NP order

## 2018-07-28 NOTE — Progress Notes (Signed)
Name: Jeff Leonard MRN: [id number] DOB: [birth year]    ADMISSION DATE:  07/05/2018  HISTORY OF PRESENT ILLNESS:  Jeff Leonard is an 67 y.o. male.with a past medical history remarkable for obstructive sleep apnea, coronary artery disease, glaucoma, prior history of pulmonary embolism, patient with right-sided pleural effusion, hemothorax, status post thoracotomy with decortication, status post IVC filter developed progressive acute renal failure with hyperkalemia. He required transfer to the intensive care unit on 10/7 with tachycardia, marginal blood pressure, in need of continuous renal replacement therapy.  On 10/7 pt became minimally responsive only able to follow commands on the LUE and nonverbal with unequal minimally reactive bilateral pupils, therefore Code Stroke initiated. He became bradycardic with near cardiac arrest requiring 1 amp of atropine and mechanical intubation for acute respiratory failure.  Tele-neurology determined pt not a candidate for TPA due to recent surgery and  recommended Stat Head and Neck CTA to rule out large vesel occlusion.  However, nephrology notified via telephone and recommended deferring administration of contrast dye due to acute renal failure recommended CT Head without contrast.  Pt required multiple vasopressors due to severe hypotension.     SIGNIFICANT EVENTS/STUDIES:  09/26-Pt admitted to [facility] unit with right hemithorax  09/27-Pt underwent CT guided right chest tube placement (size 68F) and IVC filter placement 09/28-TPA instilled through his chest tube  10/2-Pt underwent right thoracotomy with decortication of visceral and parietal pleura with 3 chest tubes in place pt transferred to ICU postop 10/3-Pt transferred out of ICU to [facility] 10/6-Pt transferred to stepdown unit with acute renal failure requiring CRRT  10/7-Pt unresponsive with unequal pupils Code stroke initiated; pt severely bradycardic (near cardiac arrest) requiring 1 amp of atropine, and  required mechanical intubation due to acute respiratory failure. Per tele-neurology pt is NOT a candidate for TPA due to recent surgery. 10/8-Right femoral arterial line placed   PAST MEDICAL HISTORY :   has a past medical history of Glaucoma, Hormone disorder, pulmonary embolus, Myocardial infarction [facility]), Past heart attack, and Sleep apnea.  has a past surgical history that includes Shoulder surgery; IVC FILTER INSERTION (N/A, 07/12/2018); Thoracotomy (Right, 08/18/2018); and Fiberoptic bronchoscopy (Right, 07/28/2018).  REVIEW OF SYSTEMS:   Unable to assess pt mechanically intubated   SUBJECTIVE:  Unable to assess pt mechanically intubated   VITAL SIGNS: Temp:  [97.2 F (36.2 C)-99.2 F (37.3 C)] 97.2 F (36.2 C) (10/07 1916) Pulse Rate:  [62-112] 62 (10/07 1905) Resp:  [20-41] 41 (10/07 2000) BP: (52-119)/(31-91) 119/84 (10/07 2000) SpO2:  [93 %-100 %] 97 % (10/07 2300) Weight:  [133.8 kg] 133.8 kg (10/07 0425)  PHYSICAL EXAMINATION: General: acutely ill appearing male, NAD mechanically intubated  Neuro: unresponsive, pupils dilatated and non reactive, not following commands  HEENT: supple, no JVD  Cardiovascular: nsr, rrr, no R/G Lungs: rhonchi throughout diminished on right, even, non labored; 3 right sided chest tubes  Abdomen: +BS x4, soft, non tender, non distended Musculoskeletal: 1+ generalized edema Skin: right sided chest tube incision   Recent Labs  Lab 07/27/18 1603 07/27/18 2010 07/27/18 2304  NA 134* 137 138  K 4.7 5.8* >7.5*  CL 97* 97* 98  CO2 21* 15* 13*  BUN 63* 54* 54*  CREATININE 6.10* 5.63* 6.20*  GLUCOSE 179* 101* <20*   Recent Labs  Lab 07/25/18 0605 07/27/18 0400 07/27/18 2304  HGB 9.9* 8.5* 8.4*  HCT 31.3* 26.3* 27.8*  WBC 16.9* 21.9* 36.1*  PLT 223 126* 113*   Dg Chest Port 1 [address]  Result Date: 07/27/2018 CLINICAL DATA:  Follow-up right-sided hemothorax. History of previous pulmonary embolism, MI. EXAM: PORTABLE CHEST 1 VIEW  COMPARISON:  Portable chest x-ray of July 26, 2018 FINDINGS: There remains volume loss on the right. There is a moderate amount of pleural fluid along the convexity of the lung. No pneumothorax is evident. 3 right-sided chest tubes remain in position. There is slight shift of the mediastinum toward the right which is stable. The left lung exhibits minimal basilar density. The heart and pulmonary vascularity are normal. The right internal jugular venous catheter tip projects at the cavoatrial junction. The observed bony thorax is unremarkable. IMPRESSION: No pneumothorax. Persistent pleural fluid or pleural thickening along the convexity of the right lung. Stable appearance of the 3 right-sided chest tubes. Minimal atelectasis at the left lung base. Electronically Signed   By: David  Martinique M.D.   On: 07/27/2018 07:29   Dg Chest Port 1 View  Result Date: 07/26/2018 CLINICAL DATA:  Central line placement, history MI EXAM: PORTABLE CHEST 1 VIEW COMPARISON:  Portable exam 1459 hours compared to 07/26/2018 at 0637 hours FINDINGS: Three RIGHT thoracostomy tubes unchanged. New large bore RIGHT jugular central venous catheter with tip projecting over RIGHT atrium. No pneumothorax. Stable heart size and mediastinal contours. RIGHT basilar atelectasis with persistent RIGHT pleural effusion. Minimal LEFT basilar atelectasis. IMPRESSION: No pneumothorax following RIGHT jugular line placement. Multiple RIGHT thoracostomy tubes with small RIGHT pleural effusion and RIGHT basilar atelectasis. Electronically Signed   By: Lavonia Shoua Ressler M.D.   On: 07/26/2018 15:12   Dg Chest Port 1 View  Result Date: 07/26/2018 CLINICAL DATA:  Right chest tube EXAM: PORTABLE CHEST 1 VIEW COMPARISON:  07/25/2018 FINDINGS: Right chest tubes remain in place. No pneumothorax. Continued right pleural effusion, patchy opacities throughout the right lung and left base atelectasis. Heart is normal size. IMPRESSION: No significant change since prior  study.  No pneumothorax. Electronically Signed   By: Rolm Baptise M.D.   On: 07/26/2018 07:23    ASSESSMENT / PLAN:  Acute respiratory failure secondary to right hemothorax s/p right thoracotomy with decortication of visceral and parietal pleura with 3 chest tubes  Mechanical Intubation  Hx: OSA and Pulmonary Embolism  Full vent support for now-vent settings reviewed and established  SBT's once all parameters met  Prn bronchodilator therapy  VAP bundle implemented  Chest tubes to suction  Cardiothoracic surgeon consulted appreciate input   Hypotension  Tachycardia  Mildly elevated troponin likely demand ischemia in setting of renal failure and acute respiratory failure  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  Continuous telemetry monitoring Trend troponin's  Prn vasopressors to maintain map >65  Acute renal failure with hyperkalemia  Metabolic acidosis  Trend BMP  Replace electrolytes as indicated  Monitor UOP Avoid nephrotoxic medications  Sodium bicarb gtt '@100'$  ml/hr  Nephrology consulted appreciate input- CRRT per recommendations   Leukocytosis of unclear etiology  Trend WBC and monitor fever curve  Trend PCT Follow cultures Will start zosyn  Hypoglycemia  CBG's q2hrs Follow hypothermic protocol   Acute encephalopathy concerning for CVA Maintain RASS goal 0 to -1 Prn fentanyl gtt to maintain RASS goal  CT Head once pt stable for transport  EEG pending  Neurology consulted appreciate input   -Updated pts wife, 2 daughters, and pastor regarding decline in pt condition and poor prognosis.  I discussed code status with pts family at this time they would like pt to remain a FULL CODE.  Marda Stalker, AGNP  Pulmonary/Critical Care Pager [phone] (please enter 7 digits) PCCM Consult Pager [phone] (please enter 7 digits)

## 2018-07-28 NOTE — Procedures (Signed)
Arterial Catheter Insertion Procedure Note Jeff Leonard [id number] [birth year]  Procedure: Insertion of Arterial Catheter  Indications: Blood pressure monitoring and Frequent blood sampling  Procedure Details Consent: Risks of procedure as well as the alternatives and risks of each were explained to the (patient/caregiver).  Consent for procedure obtained. and placed emergently Time Out: Verified patient identification, verified procedure, site/side was marked, verified correct patient position, special equipment/implants available, medications/allergies/relevent history reviewed, required imaging and test results available.  Performed  Maximum sterile technique was used including antiseptics, cap, gloves, gown, hand hygiene, mask and sheet. Skin prep: Chlorhexidine; local anesthetic administered 20 gauge catheter was inserted into right femoral artery using the Seldinger technique. ULTRASOUND GUIDANCE USED: YES Evaluation Blood flow good; BP tracing good. Complications: No apparent complications.  Jeff Leonard, AGNP  Pulmonary/Critical Care Pager [phone] (please enter 7 digits) PCCM Consult Pager [phone] (please enter 7 digits)

## 2018-07-28 NOTE — Progress Notes (Signed)
   07/28/18 0500  Clinical Encounter Type  Visited With Patient and family together  Visit Type Spiritual support  Referral From Nurse  Spiritual Encounters  Spiritual Needs Emotional;Prayer  Stress Factors  Patient Stress Factors Health changes   During Code Stroke, chaplain called wife and offered her spiritual and emotional support as she awaited her daughters and pastor. She reports significant grief in family (death of grandsons, and father of her daughters) this year.

## 2018-07-28 NOTE — Progress Notes (Signed)
eeg completed ° °

## 2018-07-28 NOTE — Consult Note (Addendum)
Consultation Note Date: 07/28/2018   Patient Name: Jeff Leonard  DOB: [birth year]  MRN: [id number]  Age / Sex: 67 y.o., male  PCP: Casilda Carls, MD Referring Physician: Nicholes Mango, MD  Reason for Consultation: Establishing goals of care  HPI/Patient Profile: Jeff Leonard is an 67 y.o. 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 He is status post IVC filter placement.   Per EMR, on 10/7, pt became minimally responsive only able to follow commands on the LUE and nonverbal with unequal minimally reactive bilateral pupils, therefore Code Stroke initiated. He declined and became bradycardic with near cardiac arrest requiring 1 amp of atropine and mechanical intubation for acute respiratory failure.  Tele-neurology determined pt not a candidate for TPA due to recent surgery and  recommended Stat Head and Neck CTA to rule out large vesel occlusion.  However, nephrology notified via telephone and recommended deferring administration of contrast dye due to acute renal failure recommended CT Head without contrast.  Pt required multiple vasopressors due to severe hypotension.  CRRT in place.  Clinical Assessment and Goals of Care:  Patient is resting in bed on ventilator following decompensation overnight. Bare hugger in place. Too unstable to go for brain imaging. Wife is at bedside. They have been married for 27 years. He has 2 children from a previous marriage. 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    We discussed his diagnosis. Plans for an EEG today. His wife states the family is praying for God's will to be done. She states there is a time that was planned for a person to be born and a time to leave this  world. We discussed what her thoughts were depending on the outcome of the EEG. She states they are taking one thing at a time.    SUMMARY OF RECOMMENDATIONS   Continuing current care. Awaiting EEG.     Code Status/Advance Care Planning:  Full code    Symptom Management:   Per primary team.   Palliative Prophylaxis:   Eye Care and Oral Care  Psycho-social/Spiritual:   Desire for further Chaplaincy support: following  Prognosis:   Very poor.   Discharge Planning: To Be Determined      Primary Diagnoses: Present on Admission: . Hemothorax on right   I have reviewed the medical record, interviewed the patient and family, and examined the patient. The following aspects are pertinent.  Past Medical History:  Diagnosis Date  . Glaucoma   . Hormone disorder   . Hx of pulmonary embolus   . Myocardial infarction ([place])   . Past heart attack   . Sleep apnea    Social History   Socioeconomic History  . Marital status: Married    Spouse name: Not on file  . Number of children: Not on file  . Years of education: Not on file  .  Highest education level: Not on file  Occupational History  . Occupation: retired  Scientific laboratory technician  . Financial resource strain: Not on file  . Food insecurity:    Worry: Not on file    Inability: Not on file  . Transportation needs:    Medical: Not on file    Non-medical: Not on file  Tobacco Use  . Smoking status: Never Smoker  . Smokeless tobacco: Never Used  Substance and Sexual Activity  . Alcohol use: No  . Drug use: No  . Sexual activity: Yes  Lifestyle  . Physical activity:    Days per week: Not on file    Minutes per session: Not on file  . Stress: Not on file  Relationships  . Social connections:    Talks on phone: Not on file    Gets together: Not on file    Attends religious service: Not on file    Active member of club or organization: Not on file    Attends meetings of clubs or organizations: Not on file     Relationship status: Not on file  Other Topics Concern  . Not on file  Social History Narrative  . Not on file   Family History  Problem Relation Age of Onset  . Heart disease Father 82  . Hypertension Mother   . Cancer Mother        Breast cancer   . Asthma Maternal Grandfather   . Cancer Paternal Uncle        lung   Scheduled Meds: . chlorhexidine gluconate (MEDLINE KIT)  15 mL Mouth Rinse BID  . dorzolamide  1 drop Both Eyes Daily  . fentaNYL (SUBLIMAZE) injection  50 mcg Intravenous Once  . folic acid  1 mg Per Tube Daily  . heparin lock flush  500 Units Intravenous Once  . hydrocortisone sod succinate (SOLU-CORTEF) inj  50 mg Intravenous Q6H  . latanoprost  1 drop Both Eyes QHS  . mouth rinse  15 mL Mouth Rinse 10 times per day  . vitamin B-6  100 mg Oral Daily  . sodium chloride flush  10-40 mL Intracatheter Q12H  . thiamine  100 mg Oral Daily  . timolol  1 drop Both Eyes Daily   Continuous Infusions: . epinephrine 20 mcg/min (07/28/18 1200)  . famotidine (PEPCID) IV Stopped (07/28/18 0342)  . fentaNYL infusion INTRAVENOUS Stopped (07/27/18 2324)  . norepinephrine (LEVOPHED) Adult infusion 75 mcg/min (07/28/18 1226)  . phenylephrine (NEO-SYNEPHRINE) Adult infusion 400 mcg/min (07/28/18 1200)  . piperacillin-tazobactam (ZOSYN)  IV 12.5 mL/hr at 07/28/18 1200  . pureflow 2,000 mL/hr at 07/27/18 1338  .  sodium bicarbonate  infusion 1000 mL 100 mL/hr at 07/28/18 1230  . vasopressin (PITRESSIN) infusion - *FOR SHOCK* 0.04 Units/min (07/28/18 1200)   PRN Meds:.acetaminophen, albuterol, alteplase, fentaNYL, heparin, metoCLOPramide (REGLAN) injection, morphine injection, ondansetron (ZOFRAN) IV, sodium chloride flush Medications Prior to Admission:  Prior to Admission medications   Medication Sig Start Date End Date Taking? Authorizing Provider  albuterol (PROVENTIL HFA;VENTOLIN HFA) 108 (90 Base) MCG/ACT inhaler Inhale 2 puffs into the lungs every 6 (six) hours as needed  for wheezing or shortness of breath. 04/02/17  Yes Wieting, Richard, MD  cetirizine (ZYRTEC) 10 MG tablet Take 10 mg by mouth daily. 07/08/18  Yes [provider]  clomiPHENE (CLOMID) 50 MG tablet One half tab daily Patient taking differently: Take 25 mg by mouth daily.  06/14/18  Yes Stoioff, Ronda Fairly, MD  cyclobenzaprine (FLEXERIL) 5  MG tablet Take 5 mg by mouth at bedtime as needed. 04/15/18  Yes [provider]  dorzolamide (TRUSOPT) 2 % ophthalmic solution Place 1 drop into both eyes daily.   Yes [provider]  latanoprost (XALATAN) 0.005 % ophthalmic solution Place 1 drop into both eyes at bedtime.  02/13/17  Yes [provider]  lisinopril (PRINIVIL,ZESTRIL) 5 MG tablet Take 5 mg by mouth daily.  05/11/18  Yes [provider]  timolol (TIMOPTIC) 0.5 % ophthalmic solution Place 1 drop into both eyes daily.  02/14/17  Yes [provider]  Vitamin D, Ergocalciferol, (DRISDOL) 50000 units CAPS capsule Take 50,000 Units by mouth every 7 (seven) days.  03/23/18  Yes [provider]  XARELTO 20 MG TABS tablet Take 20 mg by mouth every evening.  06/01/18  Yes [provider]   No Known Allergies Review of Systems  Unable to perform ROS   Physical Exam  Constitutional: No distress.  Pulmonary/Chest:  Intubated    Vital Signs: BP 91/78   Pulse (!) 25   Temp (!) 90.3 F (32.4 C)   Resp (!) 31   Ht _0  (1.854 m)   Wt 133.8 kg   SpO2 (!) 40%   BMI 38.92 kg/m  Pain Scale: 0-10 POSS *See Group Information*: 4-INTERVENTION REQUIRED,Unacceptable,Somnolent, mininal or no response to verbal and physical stimulation Pain Score: 0-No pain   SpO2: SpO2: (!) 40 % O2 Device:SpO2: (!) 40 % O2 Flow Rate: .O2 Flow Rate (L/min): 0 L/min  IO: Intake/output summary:   Intake/Output Summary (Last 24 hours) at 07/28/2018 1253 Last data filed at 07/28/2018 1200 Gross per 24 hour  Intake 6515.79 ml  Output 208 ml  Net 6307.79 ml     LBM: Last BM Date: 07/24/18 Baseline Weight: Weight: 129.2 kg Most recent weight: Weight: 133.8 kg     Palliative Assessment/Data: Intubated     Time In: 12:30 Time Out: 1:15 Time Total: 45 min Greater than 50%  of this time was spent counseling and coordinating care related to the above assessment and plan.  Signed by: Asencion Gowda, NP   Please contact Palliative Medicine Team phone at [phone] for questions and concerns.  For individual provider: See Shea Evans

## 2018-07-28 NOTE — Progress Notes (Addendum)
Dr Wynelle Link notified of possible increase of dialysate fluid increase to improve art line pressures 80s/20s.  He authorized increasing dialysate flow to 3 lters/hour

## 2018-07-28 NOTE — Progress Notes (Signed)
CODE STROKE- PHARMACY COMMUNICATION   Time CODE STROKE called/page received: [phone]  Time response to CODE STROKE was made (in person or via phone):   Time Stroke Kit retrieved from [place] (only if needed): n/a  Name of Provider/Nurse contacted: n/a  Past Medical History:  Diagnosis Date  . Glaucoma   . Hormone disorder   . Hx of pulmonary embolus   . Myocardial infarction ([place])   . Past heart attack   . Sleep apnea    Prior to Admission medications   Medication Sig Start Date End Date Taking? Authorizing Provider  albuterol (PROVENTIL HFA;VENTOLIN HFA) 108 (90 Base) MCG/ACT inhaler Inhale 2 puffs into the lungs every 6 (six) hours as needed for wheezing or shortness of breath. 04/02/17  Yes Wieting, Richard, MD  cetirizine (ZYRTEC) 10 MG tablet Take 10 mg by mouth daily. 07/08/18  Yes [provider]  clomiPHENE (CLOMID) 50 MG tablet One half tab daily Patient taking differently: Take 25 mg by mouth daily.  06/14/18  Yes Stoioff, Ronda Fairly, MD  cyclobenzaprine (FLEXERIL) 5 MG tablet Take 5 mg by mouth at bedtime as needed. 04/15/18  Yes [provider]  dorzolamide (TRUSOPT) 2 % ophthalmic solution Place 1 drop into both eyes daily.   Yes [provider]  latanoprost (XALATAN) 0.005 % ophthalmic solution Place 1 drop into both eyes at bedtime.  02/13/17  Yes [provider]  lisinopril (PRINIVIL,ZESTRIL) 5 MG tablet Take 5 mg by mouth daily.  05/11/18  Yes [provider]  timolol (TIMOPTIC) 0.5 % ophthalmic solution Place 1 drop into both eyes daily.  02/14/17  Yes [provider]  Vitamin D, Ergocalciferol, (DRISDOL) 50000 units CAPS capsule Take 50,000 Units by mouth every 7 (seven) days.  03/23/18  Yes [provider]  XARELTO 20 MG TABS tablet Take 20 mg by mouth every evening.  06/01/18  Yes [provider]    Tobie Lords ,PharmD Clinical Pharmacist  07/28/2018  6:31 AM

## 2018-07-28 NOTE — Progress Notes (Signed)
Pupils remain dilated and nonreactive, O2 sats unreadable, hypothermic 32 degrees or less, A Line MAP in 40s and 50s, pt not responsive to nail bed pressure, negative gag/cough.  FIO2 100%, pt with agonal respirations visible.  HR in 60s, which is low for him.  Writing RN informed family patient unresponsive with no sedation and they verbalized understanding that "he can't take much more of this."  MD Conforti and MD Reynolds aware.  Will attempt EEG this afternoon. CRRT running at 1/2 ordered bloodflow, no issues.

## 2018-07-28 NOTE — Progress Notes (Signed)
Called by RN for low Ve and VT alarm and possible cuff leak. Pt VT 130's, Ve 1.6 and audible cuff leak upon entering room. Pt ETT remains 27cm at the lip secured. Increased air in cuff MOV. Vt increased above 500 and Ve above 9.0. RN at bedside discussed that this was and issue earlier in the day per previous RCP.

## 2018-07-28 NOTE — Progress Notes (Signed)
Pureflow 2 K bath order remained in place this AM per Dr Wynelle Link, serum K mildly elevated.  This afternoon serum K elevated to 6.9.  Per Dr Wynelle Link we can change Pureflow to zero K.

## 2018-07-28 NOTE — Progress Notes (Signed)
Around 2200, nurse went to give medications to patient. Patient was lethargic but was still able to answer nurse questions and follow commands. By 2222 patient was becoming less responsive. Nurse sternal rub patient with minimal response, and pupils were oval and dilated. Annabelle Harman, NP was notified of patient mental status change, assessment done and code stroke was initiated. Patient started to brady down to 50's and was hypotensive. Code cart was rolled into room. Patient never loss a pulse and heart rate increase after administration of atropine. Patient was intubated and started on more pressors. Central line and arterial line was placed. Patient is still remains unresponsive.

## 2018-07-28 NOTE — Progress Notes (Signed)
   07/28/18 0945  Clinical Encounter Type  Visited With Patient;Health care provider  Visit Type Follow-up  Referral From Chaplain  Consult/Referral To Chaplain  Spiritual Encounters  Spiritual Needs Prayer   Based on report from outgoing on-call chaplain, unit chaplain followed up, offering silent and energetic prayer at patient's bedside.  Chaplain engaged patient's nurse around events of past hours.  No family present at this time.

## 2018-07-28 NOTE — Progress Notes (Signed)
Nutrition Follow-up  DOCUMENTATION CODES:   Obesity unspecified  INTERVENTION:  Patient is not appropriate for enteral nutrition at this time in setting of hemodynamic instability. Will continue to monitor status, goals of care, and plan of care. RD will provide recommendations for enteral nutrition when more stable.  NUTRITION DIAGNOSIS:   Inadequate oral intake related to inability to eat as evidenced by NPO status.  New nutrition diagnosis.  GOAL:   Provide needs based on ASPEN/SCCM guidelines  Not met at this time.  MONITOR:   Vent status, Labs, Weight trends, I & O's  REASON FOR ASSESSMENT:   LOS, Ventilator    ASSESSMENT:   67 year old male with PMHx of sleep apnea, glaucoma, CAD, hx MI, hx of pulmonary embolism who was admitted with right-sided pleural effusion, hemothorax s/p thoracotomy with decortication and placement of chest tube, also with acute renal failure.   -Patient required emergent HD on 10/6. Started on CRRT on 10/7. -Overnight patient had acute decompensation worrisome for CVA and was intubated. Unable to get CT of head. Pupils are fixed and dilated, patient is unresponsive. -Pending PMT consult to discuss goals of care.  Patient now intubated and sedated. On PRVC mode with FiO2 100% and PEEP 8cmH2O. On CVVHD with UF 0 mL/hr. Abdomen distended and taut.  Enteral Access: OGT placed 10/8; terminates in upper stomach per abdominal x-ray 10/8; 60 cm at corner of mouth  MAP: 28-55 mmHg  Patient is currently intubated on ventilator support Ve: 7.7 L/min Temp (24hrs), Avg:92.2 F (33.4 C), Min:86.2 F (30.1 C), Max:99.2 F (37.3 C)  Propofol: N/A  Medications reviewed and include: folic acid 1 mg daily per tube (being held), Solu-Cortef 50 mg Q6hrs IV, pyridoxine (vitamin B6) 100 mg daily PO (being held), thiamine 100 mg daily PO (being held), epinephrine gtt at 20 mcg/min (max), famotidine, norepinephrine gtt at 75 mcg/min, phenylephrine gtt at 400  mcg/min (max), Zosyn, sodium bicarbonate 150 mEq in D5W at 100 mL/hr (120 grams dextrose, 408 kcal daily), vasopressin 0.04 units/min.  Labs reviewed: CBG 157-208, Potassium 6.1, CO2 16, BUN 52, Creatinine 6.02, Anion gap 24, Phosphorus 12.5 (trending up).  I/O: 0 mL UOP yesterday; 43 mL output from chest tube yesterday; 0 mL removed from CRRT yesterday  Patient does not meet criteria for malnutrition at this time.  Discussed with RN and on rounds.  Diet Order:   Diet Order            Diet NPO time specified  Diet effective now              EDUCATION NEEDS:   Not appropriate for education at this time  Skin:  Skin Assessment: Skin Integrity Issues:(closed incision to right posterior upper chest)  Last BM:  07/24/2018 per chart but not BM characteristics have been documented  Height:   Ht Readings from Last 1 Encounters:  07/26/18 '6\' 1"'$  (1.854 m)    Weight:   Wt Readings from Last 1 Encounters:  07/27/18 133.8 kg    Ideal Body Weight:  83.6 kg  BMI:  Body mass index is 38.92 kg/m.  Estimated Nutritional Needs:   Kcal:  8413-2440 (11-14 kcal/kg)  Protein:  167 grams (2 grams/kg IBW)  Fluid:  UOP + 1L  Willey Blade, MS, RD, LDN Office: [phone] Pager: [phone] After Hours/Weekend Pager: [phone]

## 2018-07-28 NOTE — Progress Notes (Signed)
[facility] - [place] [facility]   PATIENT NAME: Jeff Leonard    MR#:  [id number]  DATE OF BIRTH:  04/27/1951  SUBJECTIVE:  Patient probably had a stroke last night and got intubated CT head was unable to perform.  Pupils are fixed and patient is unresponsive hypotensive on pressors.  No urine output patient is anuric Has chest tubes placed in for right-sided loculated pleural effusion/pneumothorax.  Wife at bedside  REVIEW OF SYSTEMS:  Review of system unobtainable as the patient is currently intubated DRUG ALLERGIES:  No Known Allergies  VITALS:  Blood pressure (!) 44/34, pulse (!) 25, temperature (!) 89.2 F (31.8 C), resp. rate (!) 26, height 6\' 1"  (1.854 m), weight 133.8 kg, SpO2 99 %.  PHYSICAL EXAMINATION:   Physical Exam  GENERAL:  67 y.o.-year-old patient lying in the bed with no acute distress. obese EYES: Fixed pupils  HEENT: Oropharynx with ET tube NECK:  Supple, no jugular venous distention. No thyroid enlargement, no tenderness.  LUNGS: Diminished breath sounds bilaterally, no wheezing, rales, rhonchi. No use of accessory muscles of respiration.  3Right sided CT + CARDIOVASCULAR: S1, S2 normal..  ABDOMEN: Soft, , nondistended. Bowel sounds present.  EXTREMITIES: No cyanosis, clubbing or edema b/l.    NEUROLOGIC: Unresponsive with fixed pupils PSYCHIATRIC:  patient is unresponsive SKIN: No obvious rash, lesion, or ulcer.   LABORATORY PANEL:  CBC Recent Labs  Lab 07/27/18 2304  WBC 36.1*  HGB 8.4*  HCT 27.8*  PLT 113*    Chemistries  Recent Labs  Lab 07/28/18 0630  07/28/18 1631  NA 140   < > 136  K 5.9*   < > 7.1*  CL 98   < > 100  CO2 14*   < > 13*  GLUCOSE 224*   < > 144*  BUN 57*   < > 38*  CREATININE 6.49*   < > 4.66*  CALCIUM 7.2*   < > 7.0*  MG  --    < > 2.3  AST 1,047*  --   --   ALT 671*  --   --   ALKPHOS 104  --   --   BILITOT 1.6*  --   --    < > = values in this interval not displayed.   Cardiac  Enzymes Recent Labs  Lab 07/28/18 0455  TROPONINI 0.12*   RADIOLOGY:  Dg Abd 1 View  Result Date: 07/28/2018 CLINICAL DATA:  Central line and enteric tube placements. EXAM: ABDOMEN - 1 VIEW COMPARISON:  07/25/2018 FINDINGS: Enteric tube tip in the left upper quadrant consistent with location in the upper stomach. Inferior vena caval filter is present. Gas-filled nondistended small and large bowel, likely ileus. IMPRESSION: Enteric tube tip is in the left upper quadrant consistent with location in the upper stomach. Electronically Signed   By: Burman Nieves M.D.   On: 07/28/2018 01:21   Dg Chest Port 1 View  Result Date: 07/28/2018 CLINICAL DATA:  Central line placement EXAM: PORTABLE CHEST 1 VIEW COMPARISON:  07/28/2018 FINDINGS: Endotracheal tube tip measuring 3.4 cm above the carina. Enteric tube tip is off the field of view but below the left hemidiaphragm. Three right chest tubes. Right central venous catheter tip over the low SVC region. Left central venous catheter tip now projects over the mid mediastinum, likely at the confluence of the brachiocephalic vein and SVC. No pneumothorax. Fluid or thickened pleura along the right lateral chest wall. IMPRESSION: Appliances positioned as described. No  pneumothorax. Electronically Signed   By: Burman Nieves M.D.   On: 07/28/2018 03:04   Dg Chest Port 1 View  Result Date: 07/28/2018 CLINICAL DATA:  Central line and endotracheal tube placements. EXAM: PORTABLE CHEST 1 VIEW COMPARISON:  07/28/2018 FINDINGS: Endotracheal tube tip measures 4 cm above the carina. Enteric tube tip is not visualized off the field of view but is below the left hemidiaphragm. Right central venous catheter with tip over the cavoatrial junction region. Left central venous catheter projects to the right of the mediastinum, likely extending into the right subclavian vein or possibly along the lateral SVC wall. Three right chest tubes. No visible pneumothorax. Fluid or  pleural thickening along the right lateral chest wall. IMPRESSION: Left subclavian venous catheter extends to the right of the mediastinum, possibly in the right subclavian vein or along the lateral SVC wall. Consider retracting about 1-2 cm. No pneumothorax. Electronically Signed   By: Burman Nieves M.D.   On: 07/28/2018 01:24   Dg Chest Port 1 View  Result Date: 07/28/2018 CLINICAL DATA:  Central line placement. Endotracheal and enteric tube placements. Right-sided chest tube. EXAM: PORTABLE CHEST 1 VIEW COMPARISON:  07/27/2018 FINDINGS: Endotracheal tube placed with tip measuring 5.5 cm above the carina. Enteric tube tip is in the left upper quadrant consistent with location in the upper stomach. Right central venous catheter placed with tip over the low SVC region. No pneumothorax. Two right chest tubes are present. No visible pneumothorax. Small amount of fluid or thickened pleura along the right lateral chest wall. Degenerative changes in the spine. IMPRESSION: Appliances appear to be in satisfactory location. No pneumothorax. Electronically Signed   By: Burman Nieves M.D.   On: 07/28/2018 01:20   Dg Chest Port 1 View  Result Date: 07/27/2018 CLINICAL DATA:  Follow-up right-sided hemothorax. History of previous pulmonary embolism, MI. EXAM: PORTABLE CHEST 1 VIEW COMPARISON:  Portable chest x-ray of July 26, 2018 FINDINGS: There remains volume loss on the right. There is a moderate amount of pleural fluid along the convexity of the lung. No pneumothorax is evident. 3 right-sided chest tubes remain in position. There is slight shift of the mediastinum toward the right which is stable. The left lung exhibits minimal basilar density. The heart and pulmonary vascularity are normal. The right internal jugular venous catheter tip projects at the cavoatrial junction. The observed bony thorax is unremarkable. IMPRESSION: No pneumothorax. Persistent pleural fluid or pleural thickening along the convexity  of the right lung. Stable appearance of the 3 right-sided chest tubes. Minimal atelectasis at the left lung base. Electronically Signed   By: David  [place] M.D.   On: 07/27/2018 07:29   ASSESSMENT AND PLAN:   67 year old admitted with loculated pleural effusion on CT surgery service  *Acute encephalopathy-unresponsive-possibly stroke with herniation and intracranial bleed Neurology is following EEG done CT head could not be performed  *Acute hypoxic respiratory failure Intubated, vent management by intensivist  *Severe hypotensio bradycardia  on pressors Stress dose hydrocortisone Empiric IV antibiotics Blood cultures are pending  *AKI-with hyperkalemia and anuria secondary to ATN with hypotension Etiology unclear at this time  Nephrology is following status post CRRT  renal ultrasound-bilateral renal cortical atrophy and medical renal disease change-renal vascular duplex study or CTAs recommended Avoid nephrotoxins and NSAIDs   *Right loculated pleural effusion-chronic -S/p thoracotomy and right chest tube placement on July 17, 2018 by Dr. Oakes/cardiothoracic surgery- -s/p pleurodesis/decortication on oct 2nd 2019 -chest tubes under waterseal  -   *History of  recurrent pulmonary embolism and DVT Vascular surgery consulted - S/p IVC filter placement -pt currently off xarelto. Had been on it for 4 years.  -Ultrasound bilateral LE negative.  As ultrasound negative  consider not restarting anticoagulation -will d/w PCP dr Dario Guardian on monday  *Failure to thrive with very very poor prognosis high mortality and morbidity Palliative care is involved  *DVT prophylaxis with SCDs.  Case discussed with Care Management/Social Worker. Management plans discussed with the patient, family and they are in agreement.  CODE STATUS: full  DVT Prophylaxis: SCD  TOTAL TIME TAKING CARE OF THIS PATIENT: 32 minutes.  >50% time spent on counselling and coordination of  care    Note: This dictation was prepared with Dragon dictation along with smaller phrase technology. Any transcriptional errors that result from this process are unintentional.  Ramonita Lab M.D on 07/28/2018 at 7:54 PM  Between 7am to 6pm - Pager - [phone]  After 6pm go to [email or web address] - Social research officer, government  [facility]  Office  [phone]  CC: Primary care physician; Sherrie Mustache, MDPatient ID: Jeff Leonard, male   DOB: [birth year], 67 y.o.   MRN: [id number]

## 2018-07-28 NOTE — Progress Notes (Signed)
PEEP increased to 8 per NP orders

## 2018-07-28 NOTE — Care Management (Signed)
Patient unable to have brain imaging due to renal issues.  He is unresponsive, pupils fixed and dilated, pressors, crrt, hypothermia.  Palliative to meet with family. Remains full code

## 2018-07-28 NOTE — Consult Note (Signed)
Referring Physician:Gouru    Chief Complaint: Altered mental status  HPI: Jamoni Hewes is an 67 y.o. 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  Follow-up CT showed pleural effusion consistent with empyema and was therefore started on antibiotics and inhaled bronchodilators.  He had IVC filter placement on 07/19/2018 for right hemothorax.  He had 10 mg of TPA administered through his chest tube on 07/18/2018.  Repeat chest x-ray  showed continued large right pleural effusion.  Patient had right thoracotomy decortication and bedside bronchoscopy fiberoptic on 08/03/2018.  He developed progressive acute renal failure with high hyperkalemia concerning for renal thrombosis therefore was transferred to the ICU on 0/04/2018.  Per ICU notes, he became unresponsive with ability to follow commands only on the left upper extremity which was a change from baseline, therefore a code stroke was initiated.  He was noted to be hypotensive and bradycardic requiring atropine with widened pulse pressure.  He was intubated for airway protection.  Patient was evaluated by tele-neurologist who determined that patient was not a candidate for tPA thrombolytics due to recent extensive surgery. CTA head and neck was therefore recommended which patient was unable to obtain due to kidney failure.  He has been unable to get any brain imaging due to being on multiple vaso active pressors and CRRT. NIHSS unable to be completed due to critical status.  Date last known well: Date: 07/27/2018 Time last known well: Time: 22:22 tPA Given: No: recent extensive surgery  Past Medical History:  Diagnosis Date  . Glaucoma   . Hormone disorder   . Hx of pulmonary embolus   . Myocardial infarction ([place])   . Past heart attack   .  Sleep apnea     Past Surgical History:  Procedure Laterality Date  . FIBEROPTIC BRONCHOSCOPY Right 08/07/2018   Procedure: BEDSIDE BRONCHOSCOPY FIBEROPTIC;  Surgeon: Nestor Lewandowsky, MD;  Location: [facility];  Service: Thoracic;  Laterality: Right;  . IVC FILTER INSERTION N/A 06/21/2018   Procedure: IVC FILTER INSERTION;  Surgeon: Katha Cabal, MD;  Location: [facility];  Service: Cardiovascular;  Laterality: N/A;  . SHOULDER SURGERY     left shoulder   . THORACOTOMY Right 08/01/2018   Procedure: THORACOTOMY DECORTICATION;  Surgeon: Nestor Lewandowsky, MD;  Location: [facility];  Service: Thoracic;  Laterality: Right;    Family History  Problem Relation Age of Onset  . Heart disease Father 49  . Hypertension Mother   . Cancer Mother        Breast cancer   . Asthma Maternal Grandfather   . Cancer Paternal Uncle        lung   Social History:  reports that he has never smoked. He has never used smokeless tobacco. He reports that he does not drink alcohol or use drugs.  Allergies: No Known Allergies  Medications:  I have reviewed the patient's current medications. Prior to Admission:  Medications Prior to Admission  Medication Sig Dispense Refill Last Dose  . albuterol (PROVENTIL HFA;VENTOLIN HFA) 108 (90 Base) MCG/ACT inhaler Inhale 2 puffs into the lungs every 6 (six) hours as needed for wheezing or shortness of breath. 1 Inhaler 0 Unknown at PRN  . cetirizine (ZYRTEC) 10 MG tablet Take 10 mg by mouth daily.   Taking  . clomiPHENE (CLOMID) 50 MG tablet  One half tab daily (Patient taking differently: Take 25 mg by mouth daily. ) 15 tablet 1 Taking  . cyclobenzaprine (FLEXERIL) 5 MG tablet Take 5 mg by mouth at bedtime as needed.  0 Unknown at PRN  . dorzolamide (TRUSOPT) 2 % ophthalmic solution Place 1 drop into both eyes daily.     Marland Kitchen latanoprost (XALATAN) 0.005 % ophthalmic solution Place 1 drop into both eyes at bedtime.   6 Taking  . lisinopril (PRINIVIL,ZESTRIL) 5 MG  tablet Take 5 mg by mouth daily.    Taking  . timolol (TIMOPTIC) 0.5 % ophthalmic solution Place 1 drop into both eyes daily.   6 Taking  . Vitamin D, Ergocalciferol, (DRISDOL) 50000 units CAPS capsule Take 50,000 Units by mouth every 7 (seven) days.   0 Taking  . XARELTO 20 MG TABS tablet Take 20 mg by mouth every evening.    Taking   Scheduled: . chlorhexidine gluconate (MEDLINE KIT)  15 mL Mouth Rinse BID  . dorzolamide  1 drop Both Eyes Daily  . fentaNYL (SUBLIMAZE) injection  50 mcg Intravenous Once  . folic acid  1 mg Per Tube Daily  . heparin lock flush  500 Units Intravenous Once  . hydrocortisone sod succinate (SOLU-CORTEF) inj  50 mg Intravenous Q6H  . latanoprost  1 drop Both Eyes QHS  . mouth rinse  15 mL Mouth Rinse 10 times per day  . vitamin B-6  100 mg Oral Daily  . sodium chloride flush  10-40 mL Intracatheter Q12H  . thiamine  100 mg Oral Daily  . timolol  1 drop Both Eyes Daily    ROS: Unable to obtain due to mental status  Physical Examination: Blood pressure (!) 74/41, pulse 97, temperature (!) 90.1 F (32.3 C), resp. rate (!) 32, height '6\' 1"'$  (1.854 m), weight 133.8 kg, SpO2 91 %.   HEENT-  Normocephalic, no lesions, without obvious abnormality.  Normal external eye and conjunctiva.  Normal TM's bilaterally.  Normal auditory canals and external ears. Normal external nose, mucus membranes and septum.  Normal pharynx. Cardiovascular- S1, S2 normal, pulses palpable throughout   Lungs- chest clear, no wheezing, rales, normal symmetric air entry Abdomen- soft, non-tender; bowel sounds normal; no masses,  no organomegaly Extremities- no edema Lymph-no adenopathy palpable Musculoskeletal-no joint tenderness, deformity or swelling Skin-warm and dry, no hyperpigmentation, vitiligo, or suspicious lesions  Neurologic Exam:  Mental Status: Patient does not respond to verbal stimuli. Does not respond to deep sternal rub. Does not follow commands.  Cranial  Nerves: II: Patient does not respond to confrontation bilaterally, pupils 8 mm fixed and dilated and unresponsivebilaterally III,IV,VI: doll's response absent bilaterally. V,VII: corneal reflexabsentbilaterally VIII: patient does not respond to verbal stimuli IX,X: gag reflexabsent XI: trapezius strength unable to test bilaterally XII: tongue strength unable to test Motor: Extremities flaccid throughout. No purposeful movements noted. Sensory: Does not respond to noxious stimuli in any extremity. Deep Tendon Reflexes:  Absent throughout. Plantars: Mute bilaterally Cerebellar: Unable to perform  Data Reviewed  Laboratory Studies:  Basic Metabolic Panel: Recent Labs  Lab 07/27/18 1350 07/27/18 1603 07/27/18 2010 07/27/18 2304 07/28/18 0234 07/28/18 0430 07/28/18 0455 07/28/18 0630  NA 134* 134* 137 138  --  136  --  140  K 4.9 4.7 5.8* >7.5*  --  5.7*  --  5.9*  CL 100 97* 97* 98  --  99  --  98  CO2 21* 21* 15* 13*  --  16*  --  14*  GLUCOSE 179* 179* 101* <20*  --  280*  --  224*  BUN 54* 63* 54* 54*  --  58*  --  57*  CREATININE 6.36* 6.10* 5.63* 6.20*  --  6.38*  --  6.49*  CALCIUM 7.6* 8.3* 8.5* 8.4*  --  7.4*  --  7.2*  MG 1.9 1.9 2.4 2.6*  --   --  2.5*  --   PHOS 6.9* 6.8* 10.2* 12.4* 11.6* 11.9*  --   --     Liver Function Tests: Recent Labs  Lab 07/21/18 2118 07/25/18 0605 07/26/18 0424  07/27/18 0400  07/27/18 1603 07/27/18 2010 07/27/18 2304 07/28/18 0430 07/28/18 0630  AST 20 28 33  --  25  --   --   --   --   --  1,047*  ALT '14 16 21  '$ --  17  --   --   --   --   --  671*  ALKPHOS 37* 42 50  --  45  --   --   --   --   --  104  BILITOT 0.5 1.6* 1.1  --  1.2  --   --   --   --   --  1.6*  PROT 7.2 7.9 7.4  --  7.0  --   --   --   --   --  4.4*  ALBUMIN 3.2* 3.3* 3.2*   < > 2.7*   < > 2.9* 2.8* 2.6* 1.8* 1.6*   < > = values in this interval not displayed.   No results for input(s): LIPASE, AMYLASE in the last 168 hours. No results for  input(s): AMMONIA in the last 168 hours.  CBC: Recent Labs  Lab 07/23/18 0325 07/24/18 0403 07/25/18 0605 07/27/18 0400 07/27/18 2304  WBC 14.3* 14.0* 16.9* 21.9* 36.1*  NEUTROABS  --   --   --   --  31.1*  HGB 11.4* 10.9* 9.9* 8.5* 8.4*  HCT 33.9* 32.1* 31.3* 26.3* 27.8*  MCV 93.7 93.9 95.6 94.1 99.7  PLT 226 204 223 126* 113*    Cardiac Enzymes: Recent Labs  Lab 07/27/18 2304 07/28/18 0455  TROPONINI 0.10* 0.12*    BNP: Invalid input(s): POCBNP  CBG: Recent Labs  Lab 07/26/18 1228 07/28/18 0027 07/28/18 0141 07/28/18 0442 07/28/18 0621  GLUCAP 121* 120* 190* 208* 179*    Microbiology: Results for orders placed or performed during the hospital encounter of 07/04/2018  Aerobic/Anaerobic Culture (surgical/deep wound)     Status: None   Collection Time: 07/20/2018 11:30 AM  Result Value Ref Range Status   Specimen Description   Final    WOUND PLEURAL RIGHT Performed at [facility], [place] [address], [place], [place] [address]    Special Requests   Final    Normal Performed at [facility], [address] [place], [place] [address]    Gram Stain   Final    RARE WBC PRESENT, PREDOMINANTLY MONONUCLEAR NO ORGANISMS SEEN    Culture   Final    No growth aerobically or anaerobically. Performed at [facility], [place] [address] [place], [place] [address]    Report Status 07/23/2018 FINAL  Final  Fungus Culture With Stain     Status: None (Preliminary result)   Collection Time: 08/13/2018 10:15 AM  Result Value Ref Range Status   Fungus Stain Final report  Final    Comment: (NOTE) Performed At: [facility] [address]  [place], [place] [id number] Rush Farmer MD [id number]    Fungus (Mycology) Culture PENDING  Incomplete   Fungal Source PLEURAL  Final    Comment: Performed at [facility], [address] [place], [place] [address]  Aerobic/Anaerobic Culture (surgical/deep wound)     Status: None   Collection  Time: 08/01/2018 10:15 AM  Result Value Ref Range Status   Specimen Description   Final    PLEURAL Performed at [facility], [address] [place], [place] [address]    Special Requests   Final    PLEURAL Performed at [facility], [address] [place], [place] [address]    Gram Stain   Final    FEW WBC PRESENT,BOTH PMN AND MONONUCLEAR NO ORGANISMS SEEN    Culture   Final    No growth aerobically or anaerobically. Performed at [facility], [place] [address], [place], [place] [address]    Report Status 07/27/2018 FINAL  Final  Acid Fast Smear (AFB)     Status: None   Collection Time: 08/18/2018 10:15 AM  Result Value Ref Range Status   AFB Specimen Processing Concentration  Final   Acid Fast Smear Negative  Final    Comment: (NOTE) Performed At: [facility] [place], [place] [id number] Rush Farmer MD [id number]    Source (AFB) PLEURAL  Final    Comment: Performed at [facility], [address] [place], [place] [address]  Fungus Culture Result     Status: None   Collection Time: 07/26/2018 10:15 AM  Result Value Ref Range Status   Result 1 Comment  Final    Comment: (NOTE) KOH/Calcofluor preparation:  no fungus observed. Performed At: [facility] [place], [place] [id number] Rush Farmer MD [id number]   MRSA PCR Screening     Status: None   Collection Time: 08/06/2018  4:38 PM  Result Value Ref Range Status   MRSA by PCR NEGATIVE NEGATIVE Final    Comment:        The GeneXpert MRSA Assay (FDA approved for NASAL specimens only), is one component of a comprehensive MRSA colonization surveillance program. It is not intended to diagnose MRSA infection nor to guide or monitor treatment for MRSA infections. Performed at [facility], [address] [place], [place] [address]   CULTURE, BLOOD (ROUTINE X 2) w Reflex to ID Panel     Status: None (Preliminary result)    Collection Time: 07/28/18  4:30 AM  Result Value Ref Range Status   Specimen Description BLOOD RIGHT HAND  Final   Special Requests   Final    BOTTLES DRAWN AEROBIC AND ANAEROBIC Blood Culture results may not be optimal due to an inadequate volume of blood received in culture bottles   Culture   Final    NO GROWTH < 12 HOURS Performed at [facility], [address], [place], [place] [address]    Report Status PENDING  Incomplete  CULTURE, BLOOD (ROUTINE X 2) w Reflex to ID Panel     Status: None (Preliminary result)   Collection Time: 07/28/18  4:55 AM  Result Value Ref Range Status   Specimen Description BLOOD LEFT AC  Final   Special Requests   Final    BOTTLES DRAWN AEROBIC AND ANAEROBIC Blood Culture adequate volume   Culture   Final    NO GROWTH < 12 HOURS Performed at [facility], [address] [place], [place] [address]  Report Status PENDING  Incomplete    Coagulation Studies: Recent Labs    07/27/18 2304  LABPROT 19.6*  INR 1.67    Urinalysis: No results for input(s): COLORURINE, LABSPEC, PHURINE, GLUCOSEU, HGBUR, BILIRUBINUR, KETONESUR, PROTEINUR, UROBILINOGEN, NITRITE, LEUKOCYTESUR in the last 168 hours.  Invalid input(s): APPERANCEUR  Lipid Panel:    Component Value Date/Time   CHOL 132 07/20/2013 1318   TRIG 224 (H) 07/27/2018 2304   TRIG 65 07/20/2013 1318   HDL 39 (L) 07/20/2013 1318   VLDL 13 07/20/2013 1318   LDLCALC 80 07/20/2013 1318    HgbA1C: No results found for: HGBA1C  Urine Drug Screen:  No results found for: LABOPIA, COCAINSCRNUR, LABBENZ, AMPHETMU, THCU, LABBARB  Alcohol Level: No results for input(s): ETH in the last 168 hours.  Other results: EKG: sinus tachycardia. Vent. rate 104 BPM PR interval 148 ms QRS duration 84 ms QT/QTc 338/444 ms P-R-T axes 39 -17 55  Imaging: Dg Abd 1 View  Result Date: 07/28/2018 CLINICAL DATA:  Central line and enteric tube placements. EXAM: ABDOMEN - 1 VIEW COMPARISON:   07/25/2018 FINDINGS: Enteric tube tip in the left upper quadrant consistent with location in the upper stomach. Inferior vena caval filter is present. Gas-filled nondistended small and large bowel, likely ileus. IMPRESSION: Enteric tube tip is in the left upper quadrant consistent with location in the upper stomach. Electronically Signed   By: Lucienne Capers M.D.   On: 07/28/2018 01:21   Dg Chest Port 1 View  Result Date: 07/28/2018 CLINICAL DATA:  Central line placement EXAM: PORTABLE CHEST 1 VIEW COMPARISON:  07/28/2018 FINDINGS: Endotracheal tube tip measuring 3.4 cm above the carina. Enteric tube tip is off the field of view but below the left hemidiaphragm. Three right chest tubes. Right central venous catheter tip over the low SVC region. Left central venous catheter tip now projects over the mid mediastinum, likely at the confluence of the brachiocephalic vein and SVC. No pneumothorax. Fluid or thickened pleura along the right lateral chest wall. IMPRESSION: Appliances positioned as described. No pneumothorax. Electronically Signed   By: Lucienne Capers M.D.   On: 07/28/2018 03:04   Dg Chest Port 1 View  Result Date: 07/28/2018 CLINICAL DATA:  Central line and endotracheal tube placements. EXAM: PORTABLE CHEST 1 VIEW COMPARISON:  07/28/2018 FINDINGS: Endotracheal tube tip measures 4 cm above the carina. Enteric tube tip is not visualized off the field of view but is below the left hemidiaphragm. Right central venous catheter with tip over the cavoatrial junction region. Left central venous catheter projects to the right of the mediastinum, likely extending into the right subclavian vein or possibly along the lateral SVC wall. Three right chest tubes. No visible pneumothorax. Fluid or pleural thickening along the right lateral chest wall. IMPRESSION: Left subclavian venous catheter extends to the right of the mediastinum, possibly in the right subclavian vein or along the lateral SVC wall. Consider  retracting about 1-2 cm. No pneumothorax. Electronically Signed   By: Lucienne Capers M.D.   On: 07/28/2018 01:24   Dg Chest Port 1 View  Result Date: 07/28/2018 CLINICAL DATA:  Central line placement. Endotracheal and enteric tube placements. Right-sided chest tube. EXAM: PORTABLE CHEST 1 VIEW COMPARISON:  07/27/2018 FINDINGS: Endotracheal tube placed with tip measuring 5.5 cm above the carina. Enteric tube tip is in the left upper quadrant consistent with location in the upper stomach. Right central venous catheter placed with tip over the low SVC region. No pneumothorax. Two right chest tubes  are present. No visible pneumothorax. Small amount of fluid or thickened pleura along the right lateral chest wall. Degenerative changes in the spine. IMPRESSION: Appliances appear to be in satisfactory location. No pneumothorax. Electronically Signed   By: Lucienne Capers M.D.   On: 07/28/2018 01:20   Dg Chest Port 1 View  Result Date: 07/27/2018 CLINICAL DATA:  Follow-up right-sided hemothorax. History of previous pulmonary embolism, MI. EXAM: PORTABLE CHEST 1 VIEW COMPARISON:  Portable chest x-ray of July 26, 2018 FINDINGS: There remains volume loss on the right. There is a moderate amount of pleural fluid along the convexity of the lung. No pneumothorax is evident. 3 right-sided chest tubes remain in position. There is slight shift of the mediastinum toward the right which is stable. The left lung exhibits minimal basilar density. The heart and pulmonary vascularity are normal. The right internal jugular venous catheter tip projects at the cavoatrial junction. The observed bony thorax is unremarkable. IMPRESSION: No pneumothorax. Persistent pleural fluid or pleural thickening along the convexity of the right lung. Stable appearance of the 3 right-sided chest tubes. Minimal atelectasis at the left lung base. Electronically Signed   By: David  Martinique M.D.   On: 07/27/2018 07:29   Dg Chest Port 1  View  Result Date: 07/26/2018 CLINICAL DATA:  Central line placement, history MI EXAM: PORTABLE CHEST 1 VIEW COMPARISON:  Portable exam 1459 hours compared to 07/26/2018 at 0637 hours FINDINGS: Three RIGHT thoracostomy tubes unchanged. New large bore RIGHT jugular central venous catheter with tip projecting over RIGHT atrium. No pneumothorax. Stable heart size and mediastinal contours. RIGHT basilar atelectasis with persistent RIGHT pleural effusion. Minimal LEFT basilar atelectasis. IMPRESSION: No pneumothorax following RIGHT jugular line placement. Multiple RIGHT thoracostomy tubes with small RIGHT pleural effusion and RIGHT basilar atelectasis. Electronically Signed   By: Lavonia Dana M.D.   On: 07/26/2018 15:12    Assessment: 67 y.o. male  with known history of obstructive sleep apnea, pulmonary embolism and return to DVT on Xarelto, MI, CAD, presenting with acute respiratory failure, right hemothorax status post right thoracotomy.  Patient ineligible for tPA and unable to have head CT due to being medically unstable. Concern is for hemorrhage with resultant herniation despite discontinuation of Xarelto.  Stroke in the differential as well although progress to likely herniation was quite fast for this presentation, posterior circulation can not be ruled out.   Unfortunately, prognosis for patients with fixed and dilated pupils is poor.  Remains unable to be imaged at this time.     Stroke Risk Factors - family history, hyperlipidemia and hypertension  Plan: 1.  EEG 2.  Brain imaging once more stable.     Alexis Goodell, MD Neurology [phone]  07/28/2018, 9:26 AM

## 2018-07-28 NOTE — Progress Notes (Signed)
Patient noted to have low exhaled tidal volumes and a possible cuff leak. Cuff inflated to sufficient pressure and pressure was unable to hold. Stopcock placed on pilot balloon and air reinserted into cuff. Exhaled volumes returned to appropriate levels and held. Will continue to monitor.

## 2018-07-28 NOTE — Progress Notes (Signed)
[facility]  ROUNDING NOTE   Subjective:   Overnight events reviewed. Code Stroke. Unresponsive with nonreactive pupils. Hemodynamically unstable to CT scan.   EEG for today.    Requiring multiple vasopressors.   NS at 142mL/hr  Objective:  Vital signs in last 24 hours:  Temp:  [97.2 F (36.2 C)-99.2 F (37.3 C)] 97.2 F (36.2 C) (10/07 1916) Pulse Rate:  [62-103] 97 (10/08 0200) Resp:  [11-41] 27 (10/08 0600) BP: (48-119)/(16-91) 78/57 (10/08 0200) SpO2:  [94 %-100 %] 95 % (10/08 0300) Arterial Line BP: (97-127)/(42-54) 116/44 (10/08 0600) FiO2 (%):  [100 %] 100 % (10/08 0300)  Weight change:  Filed Weights   08/09/2018 1700 07/26/18 1224 07/27/18 0425  Weight: 130.8 kg 132.7 kg 133.8 kg    Intake/Output: I/O last 3 completed shifts: In: 5845 [P.O.:540; I.V.:5229; IV Piggyback:76] Out: 100 [Urine:10; Chest Tube:90]   Intake/Output this shift:  No intake/output data recorded.  Physical Exam: General: Critically Ill  Head: ETT, NGT, subglottic suction  Eyes: Pupils nonreactive to light  Neck: RIJ temp HD catheter  Lungs:  Bilateral crackles, PRVC 100%  Heart: regular  Abdomen:  Soft, nontender, obese  Extremities:  1+  peripheral edema.  Neurologic: unresponsive  Skin: No lesions  Access: RIJ temp HD catheter    Basic Metabolic Panel: Recent Labs  Lab 07/27/18 1350 07/27/18 1603 07/27/18 2010 07/27/18 2304 07/28/18 0234 07/28/18 0430 07/28/18 0455 07/28/18 0630  NA 134* 134* 137 138  --  136  --  140  K 4.9 4.7 5.8* >7.5*  --  5.7*  --  5.9*  CL 100 97* 97* 98  --  99  --  98  CO2 21* 21* 15* 13*  --  16*  --  14*  GLUCOSE 179* 179* 101* <20*  --  280*  --  224*  BUN 54* 63* 54* 54*  --  58*  --  57*  CREATININE 6.36* 6.10* 5.63* 6.20*  --  6.38*  --  6.49*  CALCIUM 7.6* 8.3* 8.5* 8.4*  --  7.4*  --  7.2*  MG 1.9 1.9 2.4 2.6*  --   --  2.5*  --   PHOS 6.9* 6.8* 10.2* 12.4* 11.6* 11.9*  --   --     Liver Function Tests: Recent  Labs  Lab 07/21/18 2118 07/25/18 0605 07/26/18 0424  07/27/18 0400  07/27/18 1603 07/27/18 2010 07/27/18 2304 07/28/18 0430 07/28/18 0630  AST 20 28 33  --  25  --   --   --   --   --  1,047*  ALT 14 16 21   --  17  --   --   --   --   --  671*  ALKPHOS 37* 42 50  --  45  --   --   --   --   --  104  BILITOT 0.5 1.6* 1.1  --  1.2  --   --   --   --   --  1.6*  PROT 7.2 7.9 7.4  --  7.0  --   --   --   --   --  4.4*  ALBUMIN 3.2* 3.3* 3.2*   < > 2.7*   < > 2.9* 2.8* 2.6* 1.8* 1.6*   < > = values in this interval not displayed.   No results for input(s): LIPASE, AMYLASE in the last 168 hours. No results for input(s): AMMONIA in the last 168 hours.  CBC:  Recent Labs  Lab 07/23/18 0325 07/24/18 0403 07/25/18 0605 07/27/18 0400 07/27/18 2304  WBC 14.3* 14.0* 16.9* 21.9* 36.1*  NEUTROABS  --   --   --   --  31.1*  HGB 11.4* 10.9* 9.9* 8.5* 8.4*  HCT 33.9* 32.1* 31.3* 26.3* 27.8*  MCV 93.7 93.9 95.6 94.1 99.7  PLT 226 204 223 126* 113*    Cardiac Enzymes: Recent Labs  Lab 07/27/18 2304 07/28/18 0455  TROPONINI 0.10* 0.12*    BNP: Invalid input(s): POCBNP  CBG: Recent Labs  Lab 07/26/18 1228 07/28/18 0027 07/28/18 0141 07/28/18 0442 07/28/18 0621  GLUCAP 121* 120* 190* 208* 179*    Microbiology: Results for orders placed or performed during the hospital encounter of 06/30/2018  Aerobic/Anaerobic Culture (surgical/deep wound)     Status: None   Collection Time: 07/11/2018 11:30 AM  Result Value Ref Range Status   Specimen Description   Final    WOUND PLEURAL RIGHT Performed at [facility], [address], [place], [place] [address]    Special Requests   Final    Normal Performed at [facility], [address] [place], [place] [address]    Gram Stain   Final    RARE WBC PRESENT, PREDOMINANTLY MONONUCLEAR NO ORGANISMS SEEN    Culture   Final    No growth aerobically or anaerobically. Performed at [facility], [address], [place], [place] [address]    Report Status 07/23/2018 FINAL  Final  Fungus Culture With Stain     Status: None (Preliminary result)   Collection Time: 29-Jul-2018 10:15 AM  Result Value Ref Range Status   Fungus Stain Final report  Final    Comment: (NOTE) Performed At: [facility] [address] [place], [place] [id number] Jolene Schimke MD IO:[id number]    Fungus (Mycology) Culture PENDING  Incomplete   Fungal Source PLEURAL  Final    Comment: Performed at [facility], [address] [place], [place] [address]  Aerobic/Anaerobic Culture (surgical/deep wound)     Status: None   Collection Time: 07/28/2018 10:15 AM  Result Value Ref Range Status   Specimen Description   Final    PLEURAL Performed at [facility], [address], [place], [place] [address]    Special Requests   Final    PLEURAL Performed at [facility], [address] [place], [place] [address]    Gram Stain   Final    FEW WBC PRESENT,BOTH PMN AND MONONUCLEAR NO ORGANISMS SEEN    Culture   Final    No growth aerobically or anaerobically. Performed at [facility], [address], [place], [place] [address]    Report Status 07/27/2018 FINAL  Final  Acid Fast Smear (AFB)     Status: None   Collection Time: Jul 29, 2018 10:15 AM  Result Value Ref Range Status   AFB Specimen Processing Concentration  Final   Acid Fast Smear Negative  Final    Comment: (NOTE) Performed At: [facility] [address] [place], [place] [id number] Jolene Schimke MD [id number]    Source (AFB) PLEURAL  Final    Comment: Performed at [facility], [address] [place], [place] [address]  Fungus Culture Result     Status: None   Collection Time: 07/23/2018 10:15 AM  Result Value Ref Range Status   Result 1 Comment  Final    Comment: (NOTE) KOH/Calcofluor preparation:  no fungus observed. Performed At: [facility] [address] [place], [place]  [id number] Jolene Schimke MD WU:[id number]   MRSA PCR Screening     Status: None   Collection Time: 07/21/2018  4:38 PM  Result Value Ref Range Status   MRSA by PCR NEGATIVE NEGATIVE Final    Comment:        The GeneXpert MRSA Assay (FDA approved for NASAL specimens only), is one component of a comprehensive MRSA colonization surveillance program. It is not intended to diagnose MRSA infection nor to guide or monitor treatment for MRSA infections. Performed at [facility], [address] [place], [place] [address]   CULTURE, BLOOD (ROUTINE X 2) w Reflex to ID Panel     Status: None (Preliminary result)   Collection Time: 07/28/18  4:30 AM  Result Value Ref Range Status   Specimen Description BLOOD RIGHT HAND  Final   Special Requests   Final    BOTTLES DRAWN AEROBIC AND ANAEROBIC Blood Culture results may not be optimal due to an inadequate volume of blood received in culture bottles   Culture   Final    NO GROWTH < 12 HOURS Performed at [facility], [address], [place], [place] [address]    Report Status PENDING  Incomplete  CULTURE, BLOOD (ROUTINE X 2) w Reflex to ID Panel     Status: None (Preliminary result)   Collection Time: 07/28/18  4:55 AM  Result Value Ref Range Status   Specimen Description BLOOD LEFT AC  Final   Special Requests   Final    BOTTLES DRAWN AEROBIC AND ANAEROBIC Blood Culture adequate volume   Culture   Final    NO GROWTH < 12 HOURS Performed at [facility], [address], [place], [place] [address]    Report Status PENDING  Incomplete    Coagulation Studies: Recent Labs    07/27/18 2304  LABPROT 19.6*  INR 1.67    Urinalysis: No results for input(s): COLORURINE, LABSPEC, PHURINE, GLUCOSEU, HGBUR, BILIRUBINUR, KETONESUR, PROTEINUR, UROBILINOGEN, NITRITE, LEUKOCYTESUR in the last 72 hours.  Invalid input(s): APPERANCEUR    Imaging: Dg Abd 1 View  Result Date: 07/28/2018 CLINICAL DATA:  Central line  and enteric tube placements. EXAM: ABDOMEN - 1 VIEW COMPARISON:  07/25/2018 FINDINGS: Enteric tube tip in the left upper quadrant consistent with location in the upper stomach. Inferior vena caval filter is present. Gas-filled nondistended small and large bowel, likely ileus. IMPRESSION: Enteric tube tip is in the left upper quadrant consistent with location in the upper stomach. Electronically Signed   By: Burman Nieves M.D.   On: 07/28/2018 01:21   Dg Chest Port 1 View  Result Date: 07/28/2018 CLINICAL DATA:  Central line placement EXAM: PORTABLE CHEST 1 VIEW COMPARISON:  07/28/2018 FINDINGS: Endotracheal tube tip measuring 3.4 cm above the carina. Enteric tube tip is off the field of view but below the left hemidiaphragm. Three right chest tubes. Right central venous catheter tip over the low SVC region. Left central venous catheter tip now projects over the mid mediastinum, likely at the confluence of the brachiocephalic vein and SVC. No pneumothorax. Fluid or thickened pleura along the right lateral chest wall. IMPRESSION: Appliances positioned as described. No pneumothorax. Electronically Signed   By: Burman Nieves M.D.   On: 07/28/2018 03:04   Dg Chest Port 1 View  Result Date: 07/28/2018 CLINICAL DATA:  Central line and endotracheal tube placements. EXAM: PORTABLE CHEST 1 VIEW COMPARISON:  07/28/2018 FINDINGS: Endotracheal tube tip measures 4 cm  above the carina. Enteric tube tip is not visualized off the field of view but is below the left hemidiaphragm. Right central venous catheter with tip over the cavoatrial junction region. Left central venous catheter projects to the right of the mediastinum, likely extending into the right subclavian vein or possibly along the lateral SVC wall. Three right chest tubes. No visible pneumothorax. Fluid or pleural thickening along the right lateral chest wall. IMPRESSION: Left subclavian venous catheter extends to the right of the mediastinum, possibly in  the right subclavian vein or along the lateral SVC wall. Consider retracting about 1-2 cm. No pneumothorax. Electronically Signed   By: Burman Nieves M.D.   On: 07/28/2018 01:24   Dg Chest Port 1 View  Result Date: 07/28/2018 CLINICAL DATA:  Central line placement. Endotracheal and enteric tube placements. Right-sided chest tube. EXAM: PORTABLE CHEST 1 VIEW COMPARISON:  07/27/2018 FINDINGS: Endotracheal tube placed with tip measuring 5.5 cm above the carina. Enteric tube tip is in the left upper quadrant consistent with location in the upper stomach. Right central venous catheter placed with tip over the low SVC region. No pneumothorax. Two right chest tubes are present. No visible pneumothorax. Small amount of fluid or thickened pleura along the right lateral chest wall. Degenerative changes in the spine. IMPRESSION: Appliances appear to be in satisfactory location. No pneumothorax. Electronically Signed   By: Burman Nieves M.D.   On: 07/28/2018 01:20   Dg Chest Port 1 View  Result Date: 07/27/2018 CLINICAL DATA:  Follow-up right-sided hemothorax. History of previous pulmonary embolism, MI. EXAM: PORTABLE CHEST 1 VIEW COMPARISON:  Portable chest x-ray of July 26, 2018 FINDINGS: There remains volume loss on the right. There is a moderate amount of pleural fluid along the convexity of the lung. No pneumothorax is evident. 3 right-sided chest tubes remain in position. There is slight shift of the mediastinum toward the right which is stable. The left lung exhibits minimal basilar density. The heart and pulmonary vascularity are normal. The right internal jugular venous catheter tip projects at the cavoatrial junction. The observed bony thorax is unremarkable. IMPRESSION: No pneumothorax. Persistent pleural fluid or pleural thickening along the convexity of the right lung. Stable appearance of the 3 right-sided chest tubes. Minimal atelectasis at the left lung base. Electronically Signed   By: David   [place] M.D.   On: 07/27/2018 07:29   Dg Chest Port 1 View  Result Date: 07/26/2018 CLINICAL DATA:  Central line placement, history MI EXAM: PORTABLE CHEST 1 VIEW COMPARISON:  Portable exam 1459 hours compared to 07/26/2018 at 0637 hours FINDINGS: Three RIGHT thoracostomy tubes unchanged. New large bore RIGHT jugular central venous catheter with tip projecting over RIGHT atrium. No pneumothorax. Stable heart size and mediastinal contours. RIGHT basilar atelectasis with persistent RIGHT pleural effusion. Minimal LEFT basilar atelectasis. IMPRESSION: No pneumothorax following RIGHT jugular line placement. Multiple RIGHT thoracostomy tubes with small RIGHT pleural effusion and RIGHT basilar atelectasis. Electronically Signed   By: Ulyses Southward M.D.   On: 07/26/2018 15:12     Medications:   . sodium chloride 100 mL/hr at 07/28/18 0836  . epinephrine 20 mcg/min (07/28/18 0600)  . famotidine (PEPCID) IV Stopped (07/28/18 0342)  . fentaNYL infusion INTRAVENOUS Stopped (07/27/18 2324)  . norepinephrine (LEVOPHED) Adult infusion 60 mcg/min (07/28/18 0836)  . phenylephrine (NEO-SYNEPHRINE) Adult infusion 400 mcg/min (07/28/18 0758)  . piperacillin-tazobactam (ZOSYN)  IV 12.5 mL/hr at 07/28/18 0600  . pureflow 2,000 mL/hr at 07/27/18 1338  .  sodium bicarbonate  infusion 1000 mL 100 mL/hr at 07/28/18 0600  . vasopressin (PITRESSIN) infusion - *FOR SHOCK* 0.04 Units/min (07/28/18 7846)   . dorzolamide  1 drop Both Eyes Daily  . fentaNYL (SUBLIMAZE) injection  50 mcg Intravenous Once  . folic acid  1 mg Per Tube Daily  . heparin lock flush  500 Units Intravenous Once  . hydrocortisone sod succinate (SOLU-CORTEF) inj  50 mg Intravenous Q8H  . latanoprost  1 drop Both Eyes QHS  . vitamin B-6  100 mg Oral Daily  . sodium chloride flush  10-40 mL Intracatheter Q12H  . thiamine  100 mg Oral Daily  . timolol  1 drop Both Eyes Daily   acetaminophen, albuterol, alteplase, fentaNYL, heparin, metoCLOPramide  (REGLAN) injection, morphine injection, ondansetron (ZOFRAN) IV, sodium chloride flush  Assessment/ Plan:  [name] is a 67 y.o. 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  1.  Acute renal failure with hyperkalemia, metabolic acidosis and hyponatremia: Baseline creatinine of 0.98 with normal GFR on 07/23/18. Urinalysis bland on 6/18.  Acute renal failure secondary to ATN with hypotension - Anuric - Requiring renal replacement therapy. 2K bath.  - Transitioned to CRRT yesterday. Unable to tolerate higher blood flow rate - Change maintance fluid to sodium bicarbonate infusion  2. Hypotension: bradycardic episode last night requiring atropine.  Requiring norepinephrine, epinephrine, vasopressin, phenylephrine Stress dose hydrocortisone - Empiric antibiotics with pip/tazo - Blood cultures pending.   Overall prognosis is poor. Discussed plan of care with family. Palliative care has been consulted.    LOS: [address] [place] 10/8/20199:00 AM

## 2018-07-29 DIAGNOSIS — I469 Cardiac arrest, cause unspecified: Secondary | ICD-10-CM

## 2018-07-29 LAB — PROTEIN ELECTROPHORESIS, SERUM
A/G Ratio: 0.7 (ref 0.7–1.7)
ALPHA-1-GLOBULIN: 0.8 g/dL — AB (ref 0.0–0.4)
Albumin ELP: 2.8 g/dL — ABNORMAL LOW (ref 2.9–4.4)
Alpha-2-Globulin: 1.1 g/dL — ABNORMAL HIGH (ref 0.4–1.0)
Beta Globulin: 1 g/dL (ref 0.7–1.3)
GAMMA GLOBULIN: 1.2 g/dL (ref 0.4–1.8)
GLOBULIN, TOTAL: 4.2 g/dL — AB (ref 2.2–3.9)
TOTAL PROTEIN ELP: 7 g/dL (ref 6.0–8.5)

## 2018-07-29 LAB — BLOOD CULTURE ID PANEL (REFLEXED)
Acinetobacter baumannii: NOT DETECTED
CANDIDA ALBICANS: NOT DETECTED
CANDIDA GLABRATA: NOT DETECTED
Candida krusei: NOT DETECTED
Candida parapsilosis: NOT DETECTED
Candida tropicalis: NOT DETECTED
ENTEROBACTER CLOACAE COMPLEX: NOT DETECTED
Enterobacteriaceae species: NOT DETECTED
Enterococcus species: NOT DETECTED
Escherichia coli: NOT DETECTED
Haemophilus influenzae: NOT DETECTED
KLEBSIELLA OXYTOCA: NOT DETECTED
Klebsiella pneumoniae: NOT DETECTED
Listeria monocytogenes: NOT DETECTED
Methicillin resistance: NOT DETECTED
Neisseria meningitidis: NOT DETECTED
Proteus species: NOT DETECTED
Pseudomonas aeruginosa: NOT DETECTED
SERRATIA MARCESCENS: NOT DETECTED
STAPHYLOCOCCUS AUREUS BCID: NOT DETECTED
STREPTOCOCCUS PYOGENES: NOT DETECTED
Staphylococcus species: DETECTED — AB
Streptococcus agalactiae: NOT DETECTED
Streptococcus pneumoniae: NOT DETECTED
Streptococcus species: NOT DETECTED

## 2018-07-29 LAB — RENAL FUNCTION PANEL
Albumin: 1.1 g/dL — ABNORMAL LOW (ref 3.5–5.0)
Anion gap: 22 — ABNORMAL HIGH (ref 5–15)
BUN: 21 mg/dL (ref 8–23)
CO2: 14 mmol/L — ABNORMAL LOW (ref 22–32)
Calcium: 6.8 mg/dL — ABNORMAL LOW (ref 8.9–10.3)
Chloride: 100 mmol/L (ref 98–111)
Creatinine, Ser: 2.75 mg/dL — ABNORMAL HIGH (ref 0.61–1.24)
GFR calc Af Amer: 26 mL/min — ABNORMAL LOW (ref >60)
GFR calc non Af Amer: 22 mL/min — ABNORMAL LOW (ref >60)
Glucose, Bld: 188 mg/dL — ABNORMAL HIGH (ref 70–99)
Phosphorus: 9.3 mg/dL — ABNORMAL HIGH (ref 2.5–4.6)
Potassium: 5.7 mmol/L — ABNORMAL HIGH (ref 3.5–5.1)
Sodium: 136 mmol/L (ref 135–145)

## 2018-07-29 LAB — MAGNESIUM: Magnesium: 2.1 mg/dL (ref 1.7–2.4)

## 2018-07-29 LAB — GLUCOSE, CAPILLARY: Glucose-Capillary: 146 mg/dL — ABNORMAL HIGH (ref 70–99)

## 2018-07-29 MED ORDER — ATROPINE SULFATE 1 MG/10ML IJ SOSY
PREFILLED_SYRINGE | INTRAMUSCULAR | Status: AC
  Administered 2018-07-29: 1 mg via INTRAVENOUS
  Filled 2018-07-29: qty 10

## 2018-07-29 MED ORDER — ATROPINE SULFATE 1 MG/10ML IJ SOSY
1.0000 mg | PREFILLED_SYRINGE | Freq: Once | INTRAMUSCULAR | Status: AC
  Administered 2018-07-29: 1 mg via INTRAVENOUS

## 2018-07-29 MED FILL — Medication: Qty: 1 | Status: AC

## 2018-07-30 LAB — CULTURE, RESPIRATORY W GRAM STAIN: Culture: NORMAL

## 2018-07-30 LAB — CULTURE, RESPIRATORY

## 2018-07-31 ENCOUNTER — Telehealth: Payer: Self-pay

## 2018-07-31 NOTE — Telephone Encounter (Signed)
Recieved Death Certificate from [facility] Delivered/Placed ____nurse box________

## 2018-07-31 NOTE — Telephone Encounter (Signed)
Death certificate placed in [facility] folder. 

## 2018-08-01 LAB — CULTURE, BLOOD (ROUTINE X 2): SPECIAL REQUESTS: ADEQUATE

## 2018-08-02 LAB — CULTURE, BLOOD (ROUTINE X 2): CULTURE: NO GROWTH

## 2018-08-03 NOTE — Telephone Encounter (Signed)
Death certificate complete and at the front desk. [facility] [employer] and [employer] aware.

## 2018-08-20 LAB — FUNGUS CULTURE RESULT

## 2018-08-20 LAB — FUNGUS CULTURE WITH STAIN

## 2018-08-20 LAB — FUNGAL ORGANISM REFLEX

## 2018-08-21 NOTE — Progress Notes (Signed)
Patient's body transported to morgue.

## 2018-08-21 NOTE — Death Summary Note (Signed)
DEATH SUMMARY   Patient Details  Name: Jeff Leonard MRN: [id number] DOB: Apr 04, 1951  Admission/Discharge Information   Admit Date:  08-01-2018  Date of Death: Date of Death: 08/14/18  Time of Death: Time of Death: 0222  Length of Stay: Jan 17, 2023  Referring Physician: Casilda Carls, MD   Reason(s) for Hospitalization  Thoracotomy with decortication in setting of Right sided Pleural effusion and hemothorax Acute Kidney Injury Metabolic Acidosis Hyperkalemia Acute Respiratory Failure requiring Mechanical Intubation Shock  Diagnoses  Preliminary cause of death:   Cardiac Arrest Acute CVA Shock  Secondary Diagnoses (including complications and co-morbidities):  Active Problems: Shock Acute Kidney Injury  Metabolic Acidosis Hyperkalemia Acute Respiratory Failure Hemothorax on Right  Brief Hospital Course (including significant findings, care, treatment, and services provided and events leading to death)   Jeff Leonard an 67 y.o.male.with a past medical history remarkable for obstructive sleep apnea, coronary artery disease, glaucoma, prior history of pulmonary embolism, patient with right-sided pleural effusion, hemothorax, status post thoracotomy with decortication, status post IVC filter developed progressive acute renal failure with hyperkalemia. He required transfer to the intensive care unit on 10/7 with tachycardia, marginal blood pressure, in need of continuous renal replacement therapy.  On 10/7 pt became minimally responsive only able to follow commands on the LUE and nonverbal with unequal minimally reactive bilateral pupils, therefore Code Stroke initiated. He became bradycardic with near cardiac arrest requiring 1 amp of atropine and mechanical intubation for acute respiratory failure.  Tele-neurology determined pt not a candidate for TPA due to recent surgery and  recommended Stat Head and Neck CTA to rule out large vesel occlusion.  However, nephrology notified via  telephone and recommended deferring administration of contrast dye due to acute renal failure recommended CT Head without contrast. His acute decompensation is worrisome for acute CVA. Throughout the day on 10/8, pt requiring multiple vasopressors (Norepinephrine, Vasopressin, Epinephrine, Neo-Synephrine)  due to severe hypotension.   Unable to obtain CT scan of the Head due to hemodynamic instability.  Pt has been unresponsive, pupils fixed and dilated, absent corneal reflex, and absent cough/gag reflex.  Still requiring CRRT, and was started empirically on Zosyn and Solu-Cortef.    Pertinent Labs and Studies  Significant Diagnostic Studies X-ray Chest Pa Or Ap  Result Date: 07/15/2018 CLINICAL DATA:  Status post thoracentesis. EXAM: CHEST  1 VIEW COMPARISON:  Radiographs of December 30, 2017. FINDINGS: The heart size and mediastinal contours are within normal limits. Left lung is clear. Large loculated pleural effusion is noted superiorly and laterally in right hemithorax. No pneumothorax is noted. The visualized skeletal structures are unremarkable. IMPRESSION: Large loculated pleural effusion seen superiorly and laterally in right hemithorax. Electronically Signed   By: Marijo Conception, M.D.   On: 07/15/2018 13:33   Dg Abd 1 View  Result Date: 07/28/2018 CLINICAL DATA:  Central line and enteric tube placements. EXAM: ABDOMEN - 1 VIEW COMPARISON:  07/25/2018 FINDINGS: Enteric tube tip in the left upper quadrant consistent with location in the upper stomach. Inferior vena caval filter is present. Gas-filled nondistended small and large bowel, likely ileus. IMPRESSION: Enteric tube tip is in the left upper quadrant consistent with location in the upper stomach. Electronically Signed   By: Lucienne Capers M.D.   On: 07/28/2018 01:21   Ct Chest Wo Contrast  Result Date: 07/19/2018 CLINICAL DATA:  Chest tube EXAM: CT CHEST WITHOUT CONTRAST TECHNIQUE: Multidetector CT imaging of the chest was performed  following the standard protocol without IV contrast. COMPARISON:  Chest  CT 07/07/2018 FINDINGS: Cardiovascular: Coronary artery calcification and aortic atherosclerotic calcification. Port in the anterior chest wall with tip in distal SVC. Mediastinum/Nodes: No axillary or supraclavicular adenopathy. No mediastinal or hilar adenopathy. No pericardial effusion. Esophagus normal. Lungs/Pleura: Interval evacuation of the loculated pleural fluid at the RIGHT Upper lobe following chest tube placement. There is a new gas collection occupying the space of the pleural fluid. The pigtail catheter is within this gas collection. Small amount residual pleural fluid does remain at the RIGHT lung base. There is atelectasis within the RIGHT lower lobe. LEFT upper lobe is clear. The LEFT lung is mildly hyperexpanded. Upper Abdomen: Limited view of the liver, kidneys, pancreas are unremarkable. Normal adrenal glands. Musculoskeletal: No aggressive osseous lesion. IMPRESSION: 1. New gas collection replaces the loculated pleural fluid in the RIGHT upper lobe (hydropneumothorax) following chest tube placement. The chest tube within the inferior aspect of this new hydropneumothorax. 2. Atelectasis within the RIGHT upper lobe. 3. LEFT lung clear. Electronically Signed   By: Suzy Bouchard M.D.   On: 07/19/2018 15:49   Ct Chest Wo Contrast  Result Date: 07/18/2018 CLINICAL DATA:  Shortness of Breath and abnormal chest x-ray EXAM: CT CHEST WITHOUT CONTRAST TECHNIQUE: Multidetector CT imaging of the chest was performed following the standard protocol without IV contrast. COMPARISON:  Chest x-ray from the previous day. FINDINGS: Cardiovascular: Somewhat limited due to the lack of IV contrast. The thoracic aorta demonstrates a normal branching pattern without aneurysmal dilatation or significant atherosclerotic calcifications. No cardiac enlargement is seen. Mild coronary calcifications are noted. Mediastinum/Nodes: The thoracic inlet  is within normal limits. No significant hilar or mediastinal adenopathy is identified at this time. The esophagus is within normal limits. Lungs/Pleura: The left lung is well aerated without focal infiltrate or sizable effusion. On the right there is a large loculated pleural effusion identified with increased attenuation identified within. These changes are most consistent with an empyema. Some upper lobe consolidation is noted adjacent to the fluid collection. These changes are similar to that seen on recent chest x-ray but new from a prior exam of 12/30/2017 Upper Abdomen: Visualized upper abdomen is within normal limits. Musculoskeletal: Degenerative changes of the thoracic spine are noted. No definitive rib fracture is seen. IMPRESSION: Changes consistent with large loculated pleural effusion likely representing an empyema on the right. This would likely benefit from large bore chest tube placement with possible lytic therapy. Associated right upper lobe consolidation is noted. These results will be called to the ordering clinician or representative by the Radiologist Assistant, and communication documented in the PACS or zVision Dashboard. Electronically Signed   By: Inez Catalina M.D.   On: 06/29/2018 10:23   US Renal  Result Date: 07/25/2018 CLINICAL DATA:  Acute kidney injury, acute renal failure EXAM: RENAL / URINARY TRACT ULTRASOUND COMPLETE COMPARISON:  None FINDINGS: Right Kidney: Length: 10.9 cm. Cortical thinning. Increased cortical echogenicity. No mass, hydronephrosis or shadowing calcification. Left Kidney: Length: 10.5 cm. Marked cortical thinning. Increased cortical echogenicity. No gross evidence of mass or hydronephrosis, less well visualized than RIGHT kidney due to body habitus and poor acoustic window Bladder: Decompressed by Foley catheter, unable to evaluate IMPRESSION: BILATERAL renal cortical atrophy and medical renal disease changes. No gross renal mass or hydronephrosis identified.  Electronically Signed   By: Lavonia Dana M.D.   On: 07/25/2018 12:51   US Venous Img Lower Bilateral  Result Date: 07/23/2018 CLINICAL DATA:  History of DVT and pulmonary embolism. Evaluate for acute or chronic DVT.  EXAM: BILATERAL LOWER EXTREMITY VENOUS DOPPLER ULTRASOUND TECHNIQUE: Gray-scale sonography with graded compression, as well as color Doppler and duplex ultrasound were performed to evaluate the lower extremity deep venous systems from the level of the common femoral vein and including the common femoral, femoral, profunda femoral, popliteal and calf veins including the posterior tibial, peroneal and gastrocnemius veins when visible. The superficial great saphenous vein was also interrogated. Spectral Doppler was utilized to evaluate flow at rest and with distal augmentation maneuvers in the common femoral, femoral and popliteal veins. COMPARISON:  Bilateral lower extremity venous Doppler ultrasound-03/25/2017 FINDINGS: Examination is degraded due to patient body habitus and poor sonographic window. RIGHT LOWER EXTREMITY Common Femoral Vein: No evidence of thrombus. Normal compressibility, respiratory phasicity and response to augmentation. Saphenofemoral Junction: No evidence of thrombus. Normal compressibility and flow on color Doppler imaging. Profunda Femoral Vein: No evidence of thrombus. Normal compressibility and flow on color Doppler imaging. Femoral Vein: No evidence of thrombus. Normal compressibility, respiratory phasicity and response to augmentation. Popliteal Vein: No evidence of thrombus. Normal compressibility, respiratory phasicity and response to augmentation. Calf Veins: No evidence of thrombus. Normal compressibility and flow on color Doppler imaging. Superficial Great Saphenous Vein: No evidence of thrombus. Normal compressibility. Venous Reflux:  None. Other Findings:  None. LEFT LOWER EXTREMITY Common Femoral Vein: No evidence of thrombus. Normal compressibility, respiratory  phasicity and response to augmentation. Saphenofemoral Junction: No evidence of thrombus. Normal compressibility and flow on color Doppler imaging. Profunda Femoral Vein: No evidence of thrombus. Normal compressibility and flow on color Doppler imaging. Femoral Vein: No evidence of thrombus. Normal compressibility, respiratory phasicity and response to augmentation. Popliteal Vein: No evidence of thrombus. Normal compressibility, respiratory phasicity and response to augmentation. Calf Veins: No evidence of acute or chronic thrombus. Normal compressibility and flow on color Doppler imaging. Superficial Great Saphenous Vein: No evidence of thrombus. Normal compressibility. Venous Reflux:  None. Other Findings:  None. IMPRESSION: No evidence of acute or chronic DVT within either lower extremity with special attention paid to the left tibial veins. Electronically Signed   By: Sandi Mariscal M.D.   On: 07/23/2018 16:57   Dg Chest Port 1 View  Result Date: 07/28/2018 CLINICAL DATA:  Central line placement EXAM: PORTABLE CHEST 1 VIEW COMPARISON:  07/28/2018 FINDINGS: Endotracheal tube tip measuring 3.4 cm above the carina. Enteric tube tip is off the field of view but below the left hemidiaphragm. Three right chest tubes. Right central venous catheter tip over the low SVC region. Left central venous catheter tip now projects over the mid mediastinum, likely at the confluence of the brachiocephalic vein and SVC. No pneumothorax. Fluid or thickened pleura along the right lateral chest wall. IMPRESSION: Appliances positioned as described. No pneumothorax. Electronically Signed   By: Lucienne Capers M.D.   On: 07/28/2018 03:04   Dg Chest Port 1 View  Result Date: 07/28/2018 CLINICAL DATA:  Central line and endotracheal tube placements. EXAM: PORTABLE CHEST 1 VIEW COMPARISON:  07/28/2018 FINDINGS: Endotracheal tube tip measures 4 cm above the carina. Enteric tube tip is not visualized off the field of view but is below  the left hemidiaphragm. Right central venous catheter with tip over the cavoatrial junction region. Left central venous catheter projects to the right of the mediastinum, likely extending into the right subclavian vein or possibly along the lateral SVC wall. Three right chest tubes. No visible pneumothorax. Fluid or pleural thickening along the right lateral chest wall. IMPRESSION: Left subclavian venous catheter extends to the right  of the mediastinum, possibly in the right subclavian vein or along the lateral SVC wall. Consider retracting about 1-2 cm. No pneumothorax. Electronically Signed   By: Lucienne Capers M.D.   On: 07/28/2018 01:24   Dg Chest Port 1 View  Result Date: 07/28/2018 CLINICAL DATA:  Central line placement. Endotracheal and enteric tube placements. Right-sided chest tube. EXAM: PORTABLE CHEST 1 VIEW COMPARISON:  07/27/2018 FINDINGS: Endotracheal tube placed with tip measuring 5.5 cm above the carina. Enteric tube tip is in the left upper quadrant consistent with location in the upper stomach. Right central venous catheter placed with tip over the low SVC region. No pneumothorax. Two right chest tubes are present. No visible pneumothorax. Small amount of fluid or thickened pleura along the right lateral chest wall. Degenerative changes in the spine. IMPRESSION: Appliances appear to be in satisfactory location. No pneumothorax. Electronically Signed   By: Lucienne Capers M.D.   On: 07/28/2018 01:20   Dg Chest Port 1 View  Result Date: 07/27/2018 CLINICAL DATA:  Follow-up right-sided hemothorax. History of previous pulmonary embolism, MI. EXAM: PORTABLE CHEST 1 VIEW COMPARISON:  Portable chest x-ray of July 26, 2018 FINDINGS: There remains volume loss on the right. There is a moderate amount of pleural fluid along the convexity of the lung. No pneumothorax is evident. 3 right-sided chest tubes remain in position. There is slight shift of the mediastinum toward the right which is stable.  The left lung exhibits minimal basilar density. The heart and pulmonary vascularity are normal. The right internal jugular venous catheter tip projects at the cavoatrial junction. The observed bony thorax is unremarkable. IMPRESSION: No pneumothorax. Persistent pleural fluid or pleural thickening along the convexity of the right lung. Stable appearance of the 3 right-sided chest tubes. Minimal atelectasis at the left lung base. Electronically Signed   By: David  Martinique M.D.   On: 07/27/2018 07:29   Dg Chest Port 1 View  Result Date: 07/26/2018 CLINICAL DATA:  Central line placement, history MI EXAM: PORTABLE CHEST 1 VIEW COMPARISON:  Portable exam 1459 hours compared to 07/26/2018 at 0637 hours FINDINGS: Three RIGHT thoracostomy tubes unchanged. New large bore RIGHT jugular central venous catheter with tip projecting over RIGHT atrium. No pneumothorax. Stable heart size and mediastinal contours. RIGHT basilar atelectasis with persistent RIGHT pleural effusion. Minimal LEFT basilar atelectasis. IMPRESSION: No pneumothorax following RIGHT jugular line placement. Multiple RIGHT thoracostomy tubes with small RIGHT pleural effusion and RIGHT basilar atelectasis. Electronically Signed   By: Lavonia Dana M.D.   On: 07/26/2018 15:12   Dg Chest Port 1 View  Result Date: 07/26/2018 CLINICAL DATA:  Right chest tube EXAM: PORTABLE CHEST 1 VIEW COMPARISON:  07/25/2018 FINDINGS: Right chest tubes remain in place. No pneumothorax. Continued right pleural effusion, patchy opacities throughout the right lung and left base atelectasis. Heart is normal size. IMPRESSION: No significant change since prior study.  No pneumothorax. Electronically Signed   By: Rolm Baptise M.D.   On: 07/26/2018 07:23   Dg Chest Port 1 View  Result Date: 07/25/2018 CLINICAL DATA:  Chest tube in place EXAM: PORTABLE CHEST 1 VIEW COMPARISON:  07/24/2018 FINDINGS: Three left chest tubes remain in place, unchanged. Moderate right pleural effusion. No  pneumothorax. Right central line is unchanged. Patchy right lung and left basilar airspace opacities are stable. IMPRESSION: Stable right chest tubes without pneumothorax and right pleural effusion. Right lung and left basilar airspace opacities are stable. Electronically Signed   By: Rolm Baptise M.D.   On: 07/25/2018  07:40   Dg Chest Port 1 View  Result Date: 07/24/2018 CLINICAL DATA:  Chest tube in place. EXAM: PORTABLE CHEST 1 VIEW COMPARISON:  None. FINDINGS: Cardiomediastinal silhouette is unchanged in position. 3 right-sided chest tubes are noted. No definite pneumothorax is noted. Stable loculated right pleural effusion is noted. Stable bibasilar subsegmental atelectasis is noted. Minimal left pleural effusion is noted. Stable position of right internal jugular catheter with distal tip in right atrium. Bony thorax is unremarkable. IMPRESSION: Stable position of 3 right-sided chest tubes without pneumothorax. Stable loculated right pleural effusion. Stable bibasilar subsegmental atelectasis with minimal left pleural effusion. Stable right internal jugular catheter is noted with tip in right atrium. Electronically Signed   By: Marijo Conception, M.D.   On: 07/24/2018 07:20   Dg Chest Port 1 View  Result Date: 07/23/2018 CLINICAL DATA:  Chest tube EXAM: PORTABLE CHEST 1 VIEW COMPARISON:  08/16/2018 FINDINGS: Multiple chest tubes on the right remain in place. Diffuse right pleural thickening/fluid unchanged. No pneumothorax. Mild right lower lobe atelectasis unchanged Right jugular central venous catheter tip extends into the upper right atrium unchanged. Left lung clear. Negative for edema. IMPRESSION: No change from the prior study. Multiple right chest tubes with mild right lower lobe atelectasis. No pneumothorax. Electronically Signed   By: Franchot Gallo M.D.   On: 07/23/2018 07:14   Dg Chest Port 1 View  Result Date: 07/21/2018 CLINICAL DATA:  Postop day 0 decortication of the RIGHT pleural space  in this patient who has a chronic RIGHT hemothorax. EXAM: PORTABLE CHEST 1 VIEW COMPARISON:  CT chest 07/19/2018, 06/30/2018. Chest x-rays 07/19/2018 and earlier. FINDINGS: Resolution of the air in the LEFT pleural space post thoracotomy and decortication. Residual moderate pleural thickening and/or loculated pleural effusion/hemothorax. Three RIGHT chest tubes in place. Minimal subcutaneous emphysema in the RIGHT chest wall and RIGHT low neck. Improved aeration in the RIGHT lung, with residual passive atelectasis. LEFT lung remains clear. RIGHT jugular central venous catheter tip projects at or near the cavoatrial junction, unchanged. IMPRESSION: 1. Three RIGHT chest tubes in place post thoracotomy and decortication of the RIGHT pleural space. Residual loculated RIGHT pleural effusion/hemothorax, but no evidence of residual pleural air. 2. Improved aeration in the RIGHT lung, with moderate passive atelectasis persisting. 3. LEFT lung remains clear. 4. Minimal subcutaneous emphysema in the RIGHT chest wall and RIGHT low neck. Electronically Signed   By: Evangeline Dakin M.D.   On: 07/31/2018 14:17   Dg Chest Port 1 View  Result Date: 07/19/2018 CLINICAL DATA:  Postop check. EXAM: PORTABLE CHEST 1 VIEW COMPARISON:  July 18, 2018 FINDINGS: A hydropneumothorax is now identified. The air component was not present on the July 18, 2018 study. A right chest tube remains with the pigtail located peripherally. The tube is been pulled back somewhat in the interval. Stable right central line terminating near the caval atrial junction. No left-sided pneumothorax. No other change. Postoperative changes on the right. IMPRESSION: 1. There is a hydropneumothorax. The air component was not present yesterday. The chest tube is been pulled back but remains. Postoperative changes on the right. 2. No other changes. These results will be called to the ordering clinician or representative by the Radiologist Assistant, and  communication documented in the PACS or zVision Dashboard. Electronically Signed   By: Dorise Bullion III M.D   On: 07/19/2018 14:15   Dg Chest Port 1 View  Result Date: 07/18/2018 CLINICAL DATA:  Right chest tube EXAM: PORTABLE CHEST 1 VIEW COMPARISON:  07/15/2018 FINDINGS: Right jugular central venous catheter placed. Tip is in the lower SVC. Right pigtail thoracostomy projects over the right upper lung zone. No pneumothorax. Stable right pleural effusion. IMPRESSION: New right thoracostomy. New right jugular central venous catheter. Tip is in the SVC without pneumothorax Stable right pleural effusion. Electronically Signed   By: Marybelle Killings M.D.   On: 07/18/2018 08:51   Dg Abd Portable 2v  Result Date: 07/25/2018 CLINICAL DATA:  Abdominal distention and pain EXAM: PORTABLE ABDOMEN - 2 VIEW COMPARISON:  None FINDINGS: Moderate to large stool burden throughout the colon with mild gaseous distention of the colon which may reflect ileus. No evidence of bowel obstruction. No free air or organomegaly. Right chest tubes partially imaged. IVC filter in place. IMPRESSION: Moderate to large stool burden with mild gaseous distention of the colon. This may reflect ileus. Electronically Signed   By: Rolm Baptise M.D.   On: 07/25/2018 07:19   Ct Image Guided Drainage By Percutaneous Catheter  Result Date: 07/14/2018 INDICATION: 67 year old with a loculated right hemothorax. Plan for chest tube placement and intrapleural thrombolytics therapy. EXAM: CT-GUIDED PLACEMENT OF RIGHT CHEST TUBE MEDICATIONS: Sedation medications ANESTHESIA/SEDATION: 4.0 mg IV Versed 150 mcg IV Fentanyl Moderate Sedation Time:  27 minutes The patient was continuously monitored during the procedure by the interventional radiology nurse under my direct supervision. COMPLICATIONS: None immediate. TECHNIQUE: Informed written consent was obtained from the patient after a thorough discussion of the procedural risks, benefits and alternatives.  All questions were addressed. Maximal Sterile Barrier Technique was utilized including caps, mask, sterile gowns, sterile gloves, sterile drape, hand hygiene and skin antiseptic. A timeout was performed prior to the initiation of the procedure. PROCEDURE: Patient was placed supine on the CT scanner with the right arm elevated. Images through the chest were obtained. The right mid axillary region was prepped and draped in a sterile fashion using chlorhexidine. Skin and soft tissues were anesthetized with 1% lidocaine. Initially, a 10 cm Yueh catheter was directed into the pleural space but this was too short. 15 cm ,18 gauge trocar needle was directed in the pleural space with CT guidance and a small amount of brown fluid was aspirated. Stiff Amplatz wire was advanced into the pleural space. The tract was dilated to accommodate a 14 [place] multipurpose drain. Additional dark fluid was aspirated. The catheter was sutured to skin and attached to a PleurEvac device. FINDINGS: Complex right pleural effusion with thickened pleural wall. Dark brown fluid removed from the pleural space and compatible with old blood. Catheter is well positioned within the pleural space. Small pocket of gas adjacent to the chest tube is compatible with a complex or loculated collection. IMPRESSION: CT-guided placement of a 14 French right chest tube. Electronically Signed   By: Markus Daft M.D.   On: 07/05/2018 12:53   US Thoracentesis Asp Pleural Space W/img Guide  Result Date: 07/15/2018 Laverle Hobby, MD     06/26/2018  8:27 AM PROCEDURE NOTE: THORACENTESIS Date: 07/15/2018, MRN# [id number] [place] Indication: Right pleural effusion A time-out was completed verifying correct patient, procedure, site, positioning, and implant(s) or special equipment if applicable. Ultrasound guidance was used and appropriate fluid pocket was identified and marked.  Patient was positioned, prepped and draped in usual sterile fashion. 1%  Lidocaine was used to anesthetize the area. Under ultrasound guidance a right upper lobe loculated pleural effusion was noted, this effusion appeared to be high density, considerably higher than a typical fluid density. The site was anesthetized  with 1% topical lidocaine.  The thoracentesis kit was used, the trocar was introduced over the tube.  It was advanced until fluid was aspirated into the syringe, the fluid appeared to be grossly bloody.  Approximately 15 cc could be drained, further fluid could not be drained.  Therefore the procedure was stopped, obtained fluid was sent to the lab for evaluation. A chest xray was ordered to evaluate for pneumothorax. Total Fluid Removed: 15 cc of grossly bloody fluid. DDx: Loculated empyema vs. Hemothorax. --Will check CT chest. Patient tolerated the procedure well and there were no complications noted. Marda Stalker, M.D., F.C.C.P. Board Certified in Internal Medicine, Pulmonary Medicine, [place], and Sleep Medicine.  Pulmonary and Critical Care Office Number: [phone]    Microbiology Recent Results (from the past 240 hour(s))  Fungus Culture With Stain     Status: None (Preliminary result)   Collection Time: 08/03/2018 10:15 AM  Result Value Ref Range Status   Fungus Stain Final report  Final    Comment: (NOTE) Performed At: [facility], [place] [id number] Rush Farmer MD [id number]    Fungus (Mycology) Culture PENDING  Incomplete   Fungal Source PLEURAL  Final    Comment: Performed at [facility], [address] [place], [place] [address]  Aerobic/Anaerobic Culture (surgical/deep wound)     Status: None   Collection Time: 07/25/2018 10:15 AM  Result Value Ref Range Status   Specimen Description   Final    PLEURAL Performed at [facility], [address], [place], [place] [address]    Special Requests   Final    PLEURAL Performed at [facility], [address] [place], [place] [address]    Gram Stain   Final    FEW WBC PRESENT,BOTH PMN AND MONONUCLEAR NO ORGANISMS SEEN    Culture   Final    No growth aerobically or anaerobically. Performed at [facility], [place] [address] [place], [place] [address]    Report Status 07/27/2018 FINAL  Final  Acid Fast Smear (AFB)     Status: None   Collection Time: 08/09/2018 10:15 AM  Result Value Ref Range Status   AFB Specimen Processing Concentration  Final   Acid Fast Smear Negative  Final    Comment: (NOTE) Performed At: [facility] [place], [place] [id number] Rush Farmer MD [id number]    Source (AFB) PLEURAL  Final    Comment: Performed at [facility], [address] [place], [place] [address]  Fungus Culture Result     Status: None   Collection Time: 08/16/2018 10:15 AM  Result Value Ref Range Status   Result 1 Comment  Final    Comment: (NOTE) KOH/Calcofluor preparation:  no fungus observed. Performed At: [facility] [place], [place] [id number] Rush Farmer MD [id number]   MRSA PCR Screening     Status: None   Collection Time: 08/06/2018  4:38 PM  Result Value Ref Range Status   MRSA by PCR NEGATIVE NEGATIVE Final    Comment:        The GeneXpert MRSA Assay (FDA approved for NASAL specimens only), is one component of a comprehensive MRSA colonization surveillance program. It is not intended to diagnose MRSA infection nor to guide or monitor treatment for MRSA infections. Performed at [facility], [address] [place], [place] [address]   Culture, respiratory (non-expectorated)     Status: None (Preliminary result)  Collection Time: 07/28/18  4:09 AM  Result Value Ref Range Status   Specimen Description   Final    TRACHEAL ASPIRATE Performed at [facility], [address], [place], [place] [address]    Special Requests   Final    NONE Performed at [facility], [address] [place], [place] [address]    Gram Stain   Final    RARE WBC PRESENT,BOTH PMN AND MONONUCLEAR RARE GRAM POSITIVE COCCI Performed at [facility], [place] [address], [place], [place] [address]    Culture PENDING  Incomplete   Report Status PENDING  Incomplete  CULTURE, BLOOD (ROUTINE X 2) w Reflex to ID Panel     Status: None (Preliminary result)   Collection Time: 07/28/18  4:30 AM  Result Value Ref Range Status   Specimen Description BLOOD RIGHT HAND  Final   Special Requests   Final    BOTTLES DRAWN AEROBIC AND ANAEROBIC Blood Culture results may not be optimal due to an inadequate volume of blood received in culture bottles   Culture   Final    NO GROWTH < 12 HOURS Performed at [facility], [address] [place], [place] [address]    Report Status PENDING  Incomplete  CULTURE, BLOOD (ROUTINE X 2) w Reflex to ID Panel     Status: None (Preliminary result)   Collection Time: 07/28/18  4:55 AM  Result Value Ref Range Status   Specimen Description BLOOD LEFT AC  Final   Special Requests   Final    BOTTLES DRAWN AEROBIC AND ANAEROBIC Blood Culture adequate volume   Culture   Final    NO GROWTH < 12 HOURS Performed at [facility], [address] [place],  [address]    Report Status PENDING  Incomplete    Lab Basic Metabolic Panel: Recent Labs  Lab 07/28/18 0455  07/28/18 0843 07/28/18 1154 07/28/18 1631 07/28/18 1945 07/28/18 1946 07/28/18 2357  NA  --    < > 139 140 136 137  --  136  K  --    < > 6.1* 6.9* 7.1* 6.3*  --  5.7*  CL  --    < > 99 102 100 102  --  100  CO2  --    < > 16* 13* 13* 15*  --  14*  GLUCOSE  --    < > 184* 147* 144* 161*  --  188*  BUN  --    < > 52* 46* 38* 29*  --  21  CREATININE  --    < > 6.02* 5.35* 4.66* 3.60*  --  2.75*  CALCIUM  --    < > 7.2* 7.3* 7.0* 7.1*  --  6.8*  MG 2.5*  --  2.3 2.3 2.3  --  2.1  --   PHOS  --   --  12.5* 11.9* 10.9* 9.6*  --  9.3*   < > = values in this  interval not displayed.   Liver Function Tests: Recent Labs  Lab 07/25/18 0605 07/26/18 0424  07/27/18 0400  07/28/18 0630 07/28/18 0843 07/28/18 1154 07/28/18 1631 07/28/18 1945 07/28/18 2357  AST 28 33  --  25  --  1,047*  --   --   --   --   --   ALT 16 21  --  17  --  671*  --   --   --   --   --  ALKPHOS 42 50  --  45  --  104  --   --   --   --   --   BILITOT 1.6* 1.1  --  1.2  --  1.6*  --   --   --   --   --   PROT 7.9 7.4  --  7.0  --  4.4*  --   --   --   --   --   ALBUMIN 3.3* 3.2*   < > 2.7*   < > 1.6* 1.6* 1.5* 1.4* 1.3* 1.1*   < > = values in this interval not displayed.   No results for input(s): LIPASE, AMYLASE in the last 168 hours. No results for input(s): AMMONIA in the last 168 hours. CBC: Recent Labs  Lab 07/23/18 0325 07/24/18 0403 07/25/18 0605 07/27/18 0400 07/27/18 2304  WBC 14.3* 14.0* 16.9* 21.9* 36.1*  NEUTROABS  --   --   --   --  31.1*  HGB 11.4* 10.9* 9.9* 8.5* 8.4*  HCT 33.9* 32.1* 31.3* 26.3* 27.8*  MCV 93.7 93.9 95.6 94.1 99.7  PLT 226 204 223 126* 113*   Cardiac Enzymes: Recent Labs  Lab 07/27/18 2304 07/28/18 0455  TROPONINI 0.10* 0.12*   Sepsis Labs: Recent Labs  Lab 07/24/18 0403 07/25/18 0605 07/27/18 0243 07/27/18 0400 07/27/18 2304 07/28/18 0455 07/28/18 0843  PROCALCITON  --   --  4.64  --   --  4.55  --   WBC 14.0* 16.9*  --  21.9* 36.1*  --   --   LATICACIDVEN  --   --  16.6*  --   --   --  17.8*    Procedures/Operations  Right Thoracotomy with Decortication 08/05/2018 Right IJ Temporary HD Catheter Placed 07/26/18 CRRT initiated 07/26/18 Mechanical Intubation 07/27/18     Darel Hong, AGACNP-BC [facility] Pager: [phone]  08/07/18, 3:26 AM

## 2018-08-21 NOTE — Progress Notes (Signed)
Around 0141 patient started to have heart rhythm changes and blood pressure continues to drop despite being max out on four pressors. Patient also had increase of RR in the 40's. NP was notified of rhythm change and RT was called to room for increase of RR. Around 0200 patient started to brady to the 50's and went eventually went asystole. Coded Blue was initiated and chest compression was started immediately. Patient coded for 16 mins with no ROSC despite multiple interventions. Time of death was called and patient's family was notified of patient outcome.

## 2018-08-21 NOTE — Significant Event (Signed)
CODE BLUE NOTE  0206>>Pt with bradycardia with progression to asystole / cardiac arrest.  CODE BLUE called, CPR initiated, and 1 mg Epinephrine given.  After 1st round of Epinephrine and CPR, pt noted to be in PEA.  CPR continued and Epinephrine again given.  Pt receiving multiple rounds of Epinephrine, Calcium gluconate, Bicarb, and Atropine.  See CODE BLUE sheet for full details.    Updated pt's daughter at bedside at 0212 that CODE efforts will likely be futile given high suspicion for herniation.  Discussed with daughter that we will continue resuscitative efforts for 10 more minutes and see if able to obtain ROSC.  Pt's daughter is in agreement with plan.  0222>> Pt remains PEA despite CPR and multiple rounds of previously noted medications.  Decision made to end resuscitative efforts given that likely has suffered devastating brain injury earlier today given suspected brain herniation and ? hemorrhagic stroke (as evidence by unresponsiveness, fixed dilated pupils, absence of corneal reflex, absence of gag/cough reflexes) and that this is likely a futile cardiac arrest.  Time of death 0222.  Updated pt's daughter at 78 at bedside that her father has expired despite CODE BLUE and maximal resuscitative efforts.  Pt's daughter is accepting and appreciative of efforts.    Harlon Ditty, AGACNP-BC [facility] Pager: [phone]

## 2018-08-21 NOTE — ED Provider Notes (Signed)
[place]  Department of Emergency Medicine   Code Blue CONSULT NOTE  Chief Complaint: Cardiac arrest/unresponsive   Level V Caveat: Unresponsive  History of present illness: I was contacted by the hospital for a CODE BLUE cardiac arrest upstairs and presented to the patient's bedside.  When I arrived at bedside with the critical care nurse practitioner was running the code.  Briefly the patient is a 67 year old man who was admitted to the hospital with a loculated right-sided pleural effusion and is status post right thoracostomy earlier this week.  Apparently last night he had a respiratory arrest and was intubated.  He has been unresponsive on multiple pressors throughout the course of the day and is felt that earlier today he suffered a devastating brain injury.  ROS: Unable to obtain, Level V caveat  Scheduled Meds: . atropine  1 mg Intravenous Once  . atropine      . chlorhexidine gluconate (MEDLINE KIT)  15 mL Mouth Rinse BID  . dorzolamide  1 drop Both Eyes Daily  . fentaNYL (SUBLIMAZE) injection  50 mcg Intravenous Once  . folic acid  1 mg Per Tube Daily  . hydrocortisone sod succinate (SOLU-CORTEF) inj  50 mg Intravenous Q6H  . latanoprost  1 drop Both Eyes QHS  . mouth rinse  15 mL Mouth Rinse 10 times per day  . vitamin B-6  100 mg Oral Daily  . sodium chloride flush  10-40 mL Intracatheter Q12H  . thiamine  100 mg Oral Daily  . timolol  1 drop Both Eyes Daily   Continuous Infusions: . epinephrine 20 mcg/min (08/02/2018 0100)  . famotidine (PEPCID) IV 20 mg (02-Aug-2018 0106)  . fentaNYL infusion INTRAVENOUS Stopped (07/27/18 2324)  . norepinephrine (LEVOPHED) Adult infusion 80 mcg/min (08/02/2018 0100)  . phenylephrine (NEO-SYNEPHRINE) Adult infusion 400 mcg/min (Aug 02, 2018 0100)  . piperacillin-tazobactam (ZOSYN)  IV 12.5 mL/hr at 08/02/2018 0100  . pureflow 2,000 mL/hr at 07/28/18 2100  .  sodium bicarbonate  infusion 1000 mL 100 mL/hr at 02-Aug-2018 0100  .  vasopressin (PITRESSIN) infusion - *FOR SHOCK* 0.04 Units/min (2018/08/02 0100)   PRN Meds:.acetaminophen, albuterol, alteplase, fentaNYL, heparin, metoCLOPramide (REGLAN) injection, morphine injection, ondansetron (ZOFRAN) IV, sodium chloride flush Past Medical History:  Diagnosis Date  . Glaucoma   . Hormone disorder   . Hx of pulmonary embolus   . Myocardial infarction ([place])   . Past heart attack   . Sleep apnea    Past Surgical History:  Procedure Laterality Date  . FIBEROPTIC BRONCHOSCOPY Right 07/25/2018   Procedure: BEDSIDE BRONCHOSCOPY FIBEROPTIC;  Surgeon: Nestor Lewandowsky, MD;  Location: [facility] OR[facility];  Service: Thoracic;  Laterality: Right;  . IVC FILTER INSERTION N/A 07/20/2018   Procedure: IVC FILTER INSERTION;  Surgeon: Katha Cabal, MD;  Location: [facility];  Service: Cardiovascular;  Laterality: N/A;  . SHOULDER SURGERY     left shoulder   . THORACOTOMY Right 08/04/2018   Procedure: THORACOTOMY DECORTICATION;  Surgeon: Nestor Lewandowsky, MD;  Location: [facility] ORS;  Service: Thoracic;  Laterality: Right;   Social History   Socioeconomic History  . Marital status: Married    Spouse name: Not on file  . Number of children: Not on file  . Years of education: Not on file  . Highest education level: Not on file  Occupational History  . Occupation: retired  Scientific laboratory technician  . Financial resource strain: Not on file  . Food insecurity:    Worry: Not on file  Inability: Not on file  . Transportation needs:    Medical: Not on file    Non-medical: Not on file  Tobacco Use  . Smoking status: Never Smoker  . Smokeless tobacco: Never Used  Substance and Sexual Activity  . Alcohol use: No  . Drug use: No  . Sexual activity: Yes  Lifestyle  . Physical activity:    Days per week: Not on file    Minutes per session: Not on file  . Stress: Not on file  Relationships  . Social connections:    Talks on phone: Not on file    Gets together: Not on file    Attends  religious service: Not on file    Active member of club or organization: Not on file    Attends meetings of clubs or organizations: Not on file    Relationship status: Not on file  . Intimate partner violence:    Fear of current or ex partner: Not on file    Emotionally abused: Not on file    Physically abused: Not on file    Forced sexual activity: Not on file  Other Topics Concern  . Not on file  Social History Narrative  . Not on file   No Known Allergies  Last set of Vital Signs (not current) Vitals:   13-Aug-2018 0000 13-Aug-2018 0100  BP: (!) 63/39 (!) 54/21  Pulse:    Resp: (!) 34 (!) 37  Temp: (!) 86 F (30 C) (!) 83.7 F (28.7 C)  SpO2:        Physical Exam  Gen: unresponsive Cardiovascular: pulseless  Chest drain to right chest Resp: apneic. Breath sounds equal bilaterally with bagging  Abd: Distended Neuro: GCS 3, unresponsive to pain  HEENT:gag reflex absent  Neck: No crepitus  Musculoskeletal: No deformity  Skin: warm    Medical Decision making  When I arrived at the patient's bedside he was in pulseless electrical activity with active CPR ongoing.  He had a definitive airway and was being appropriately managed.  It sounds like the patient likely has herniated earlier today and this will be a futile cardiac arrest.  My assistance was not required so I returned to the emergency department.     Darel Hong, MD 08-13-2018 [phone]

## 2018-08-21 DEATH — deceased

## 2018-08-31 LAB — ACID FAST CULTURE WITH REFLEXED SENSITIVITIES

## 2018-08-31 LAB — ACID FAST CULTURE WITH REFLEXED SENSITIVITIES (MYCOBACTERIA): Acid Fast Culture: NEGATIVE

## 2018-09-04 LAB — ACID FAST CULTURE WITH REFLEXED SENSITIVITIES (MYCOBACTERIA)

## 2018-09-04 LAB — ACID FAST CULTURE WITH REFLEXED SENSITIVITIES: ACID FAST CULTURE - AFSCU3: NEGATIVE

## 2019-11-25 IMAGING — US US RENAL
1 series · 14 of 25 positions shown · non-contrast
Comparison: None

CLINICAL DATA: Acute kidney injury, acute renal failure

EXAM:
RENAL / URINARY TRACT ULTRASOUND COMPLETE

[Series 1: us renal · 0.23mm/px · 14 of 36 slices shown]
[im 1/36]
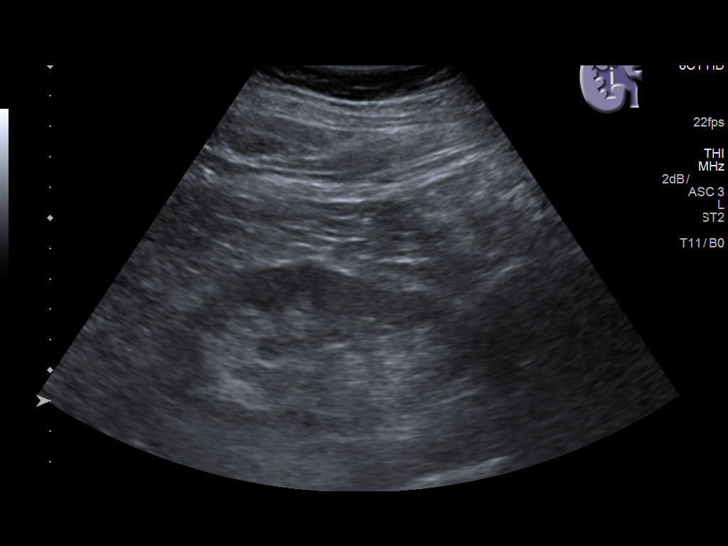
[im 3/36]
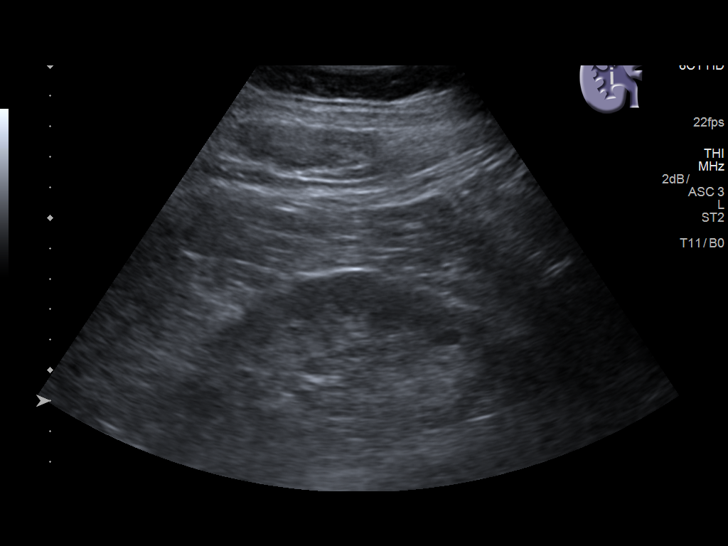
[im 6/36]
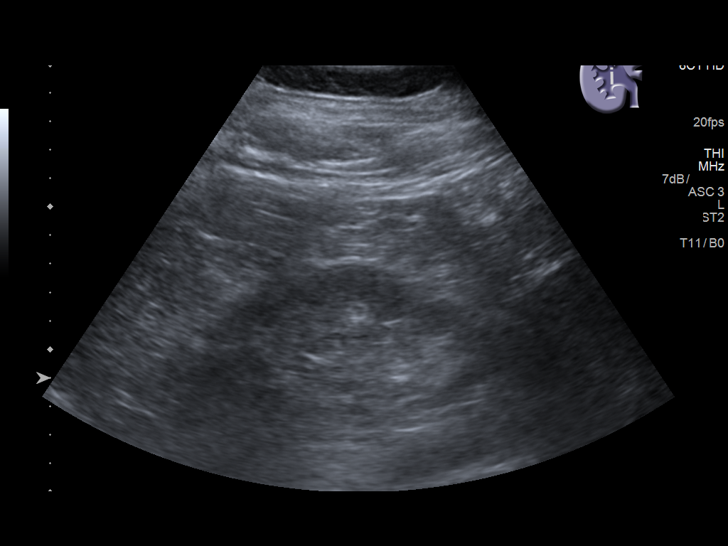
[im 9/36]
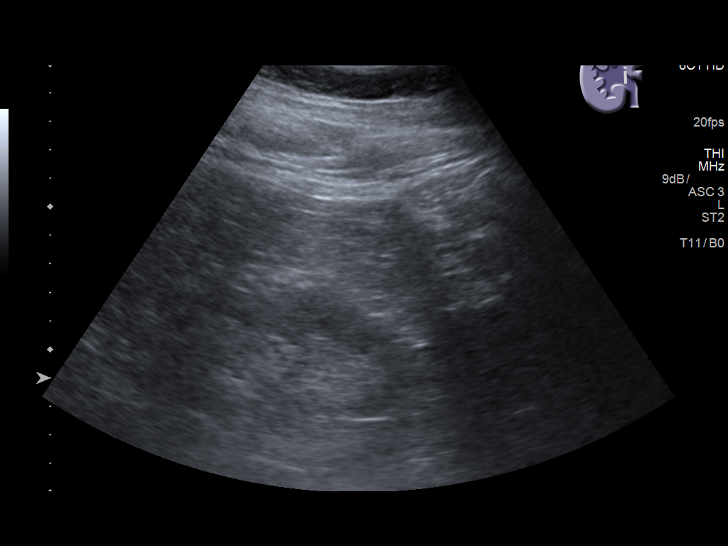
[im 12/36]
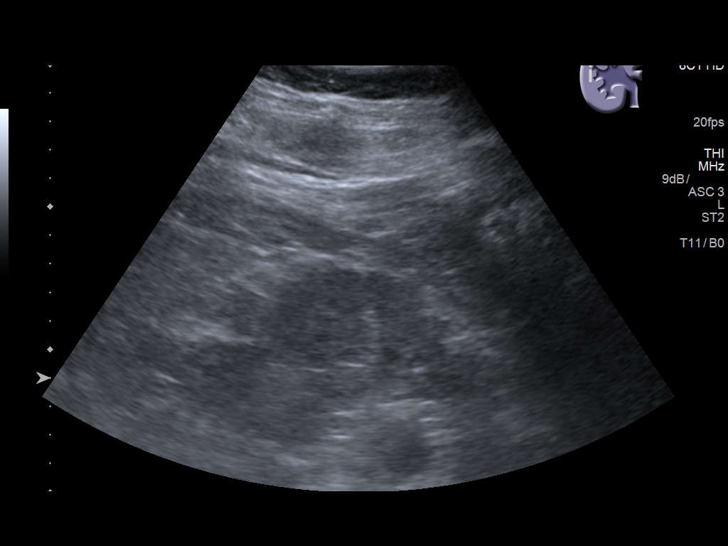
[im 14/36]
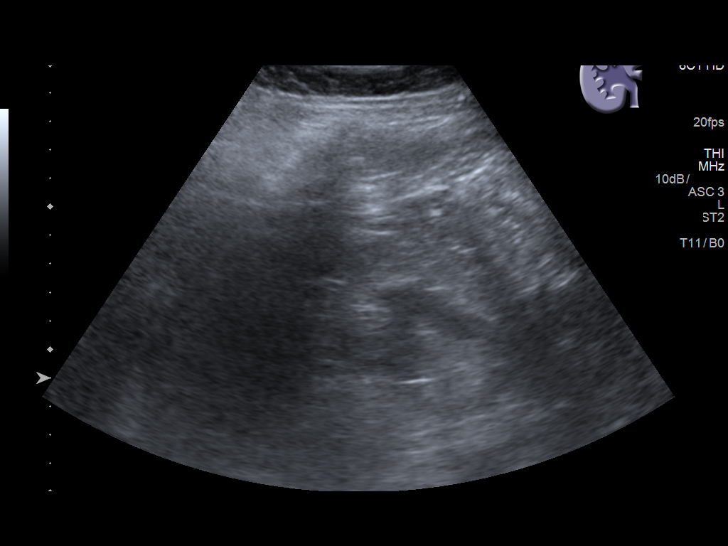
[im 17/36]
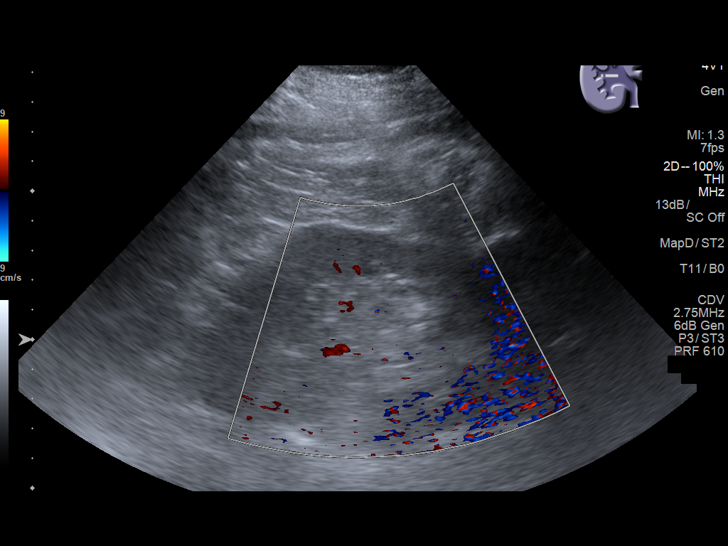
[im 19/36]
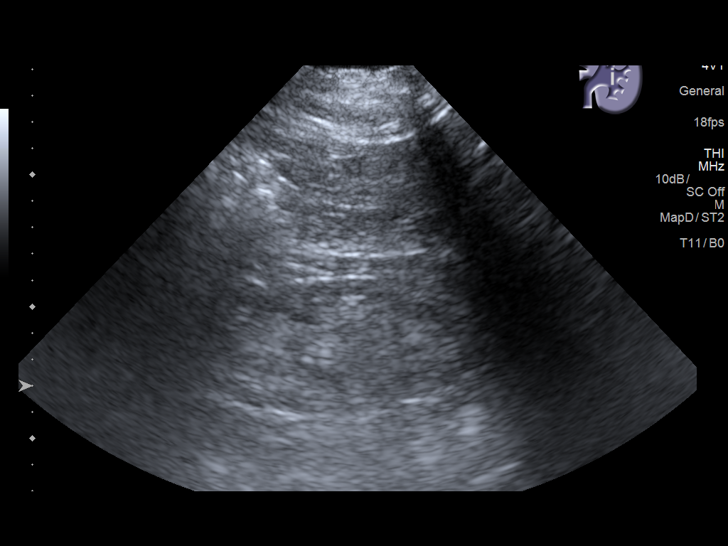
[im 22/36]
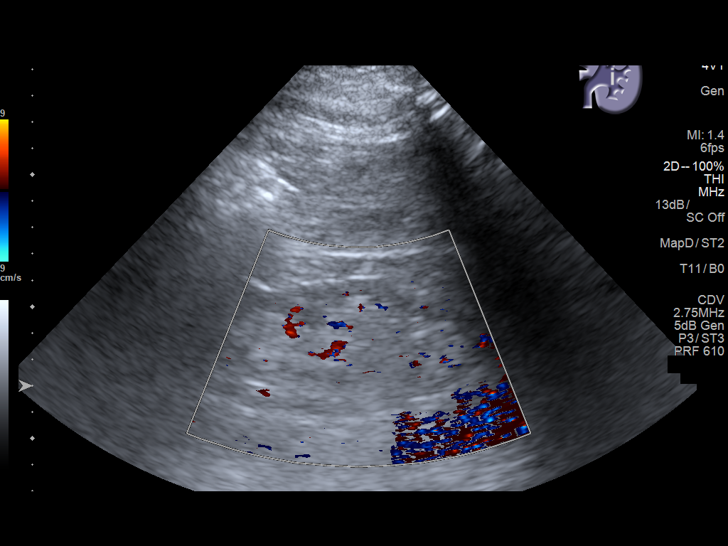
[im 24/36]
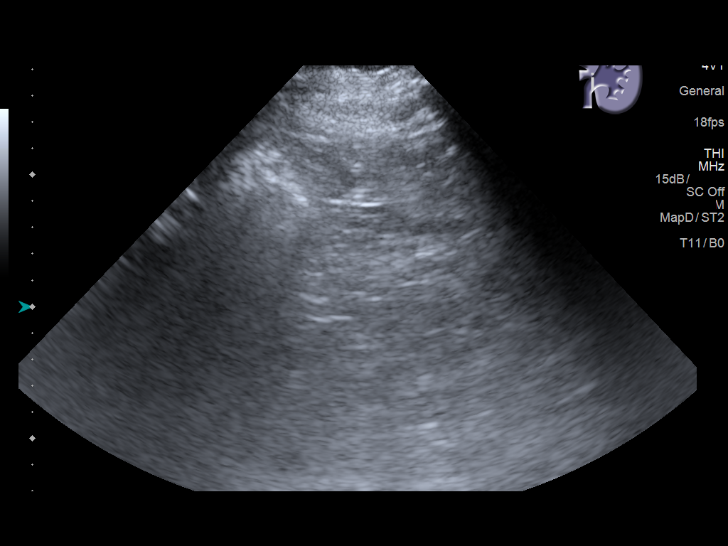
[im 27/36]
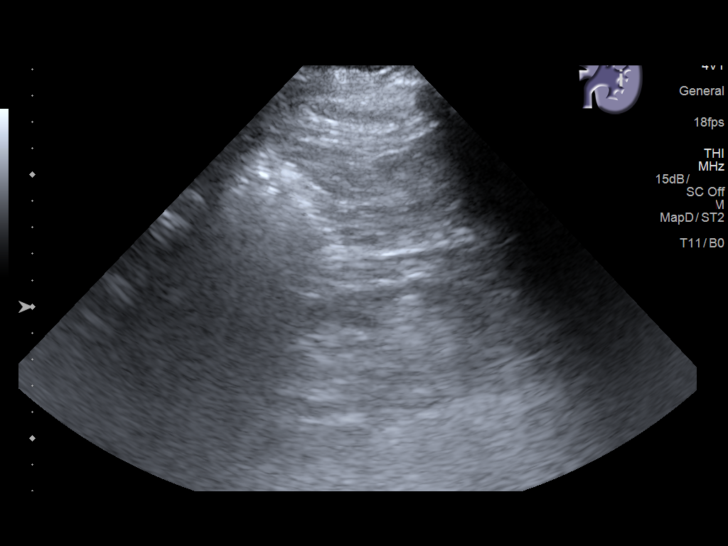
[im 30/36]
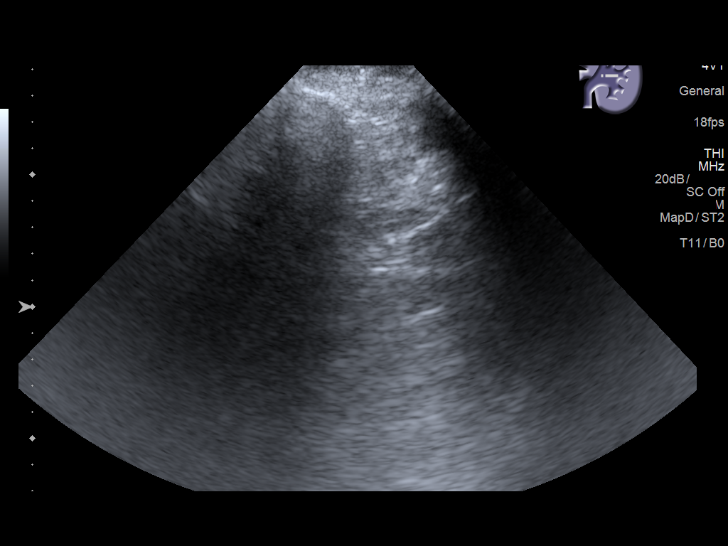
[im 33/36]
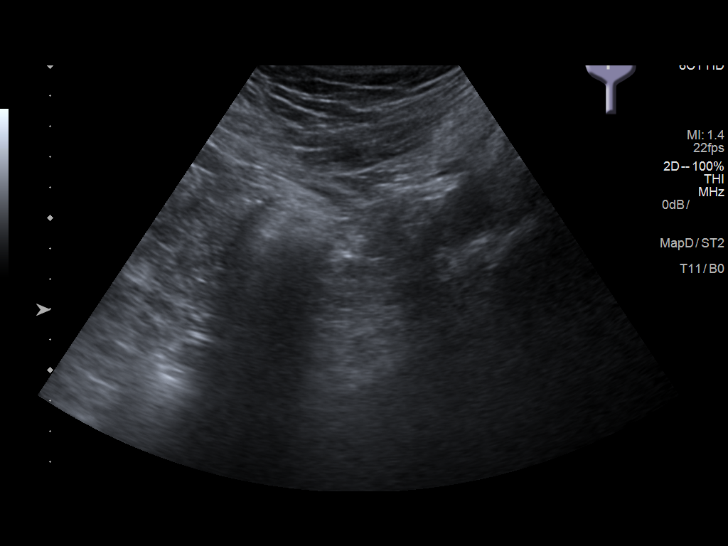
[im 36/36]
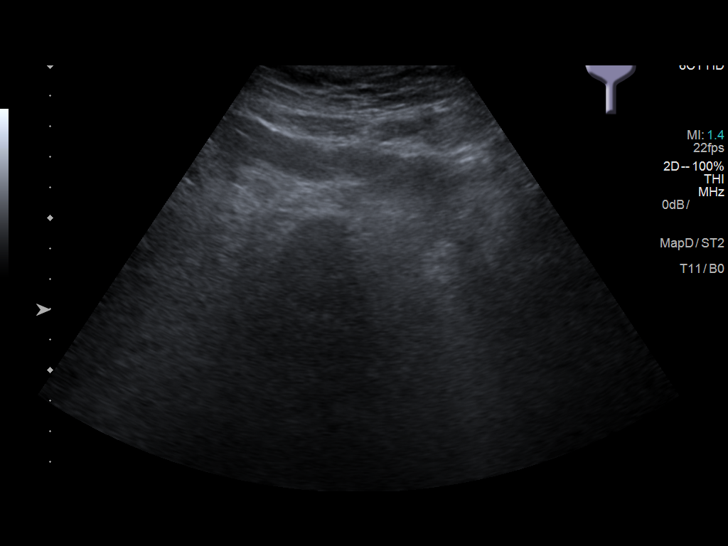

[14 of 25 positions shown; findings below may reference images not displayed]

FINDINGS: Right Kidney:

Length: 10.9 cm. Cortical thinning. Increased cortical echogenicity.
No mass, hydronephrosis or shadowing calcification.

Left Kidney:

Length: 10.5 cm. Marked cortical thinning. Increased cortical
echogenicity. No gross evidence of mass or hydronephrosis, less well
visualized than RIGHT kidney due to body habitus and poor acoustic
window

Bladder:

Decompressed by Foley catheter, unable to evaluate
IMPRESSION: BILATERAL renal cortical atrophy and medical renal disease changes.

No gross renal mass or hydronephrosis identified.

## 2020-05-26 IMAGING — DX DG CHEST 1V PORT
1 series · 1 of 1 positions shown · non-contrast
Comparison: 07/27/2018

CLINICAL DATA: Central line placement. Endotracheal and enteric
tube placements. Right-sided chest tube.

EXAM:
PORTABLE CHEST 1 VIEW

[chest ap]
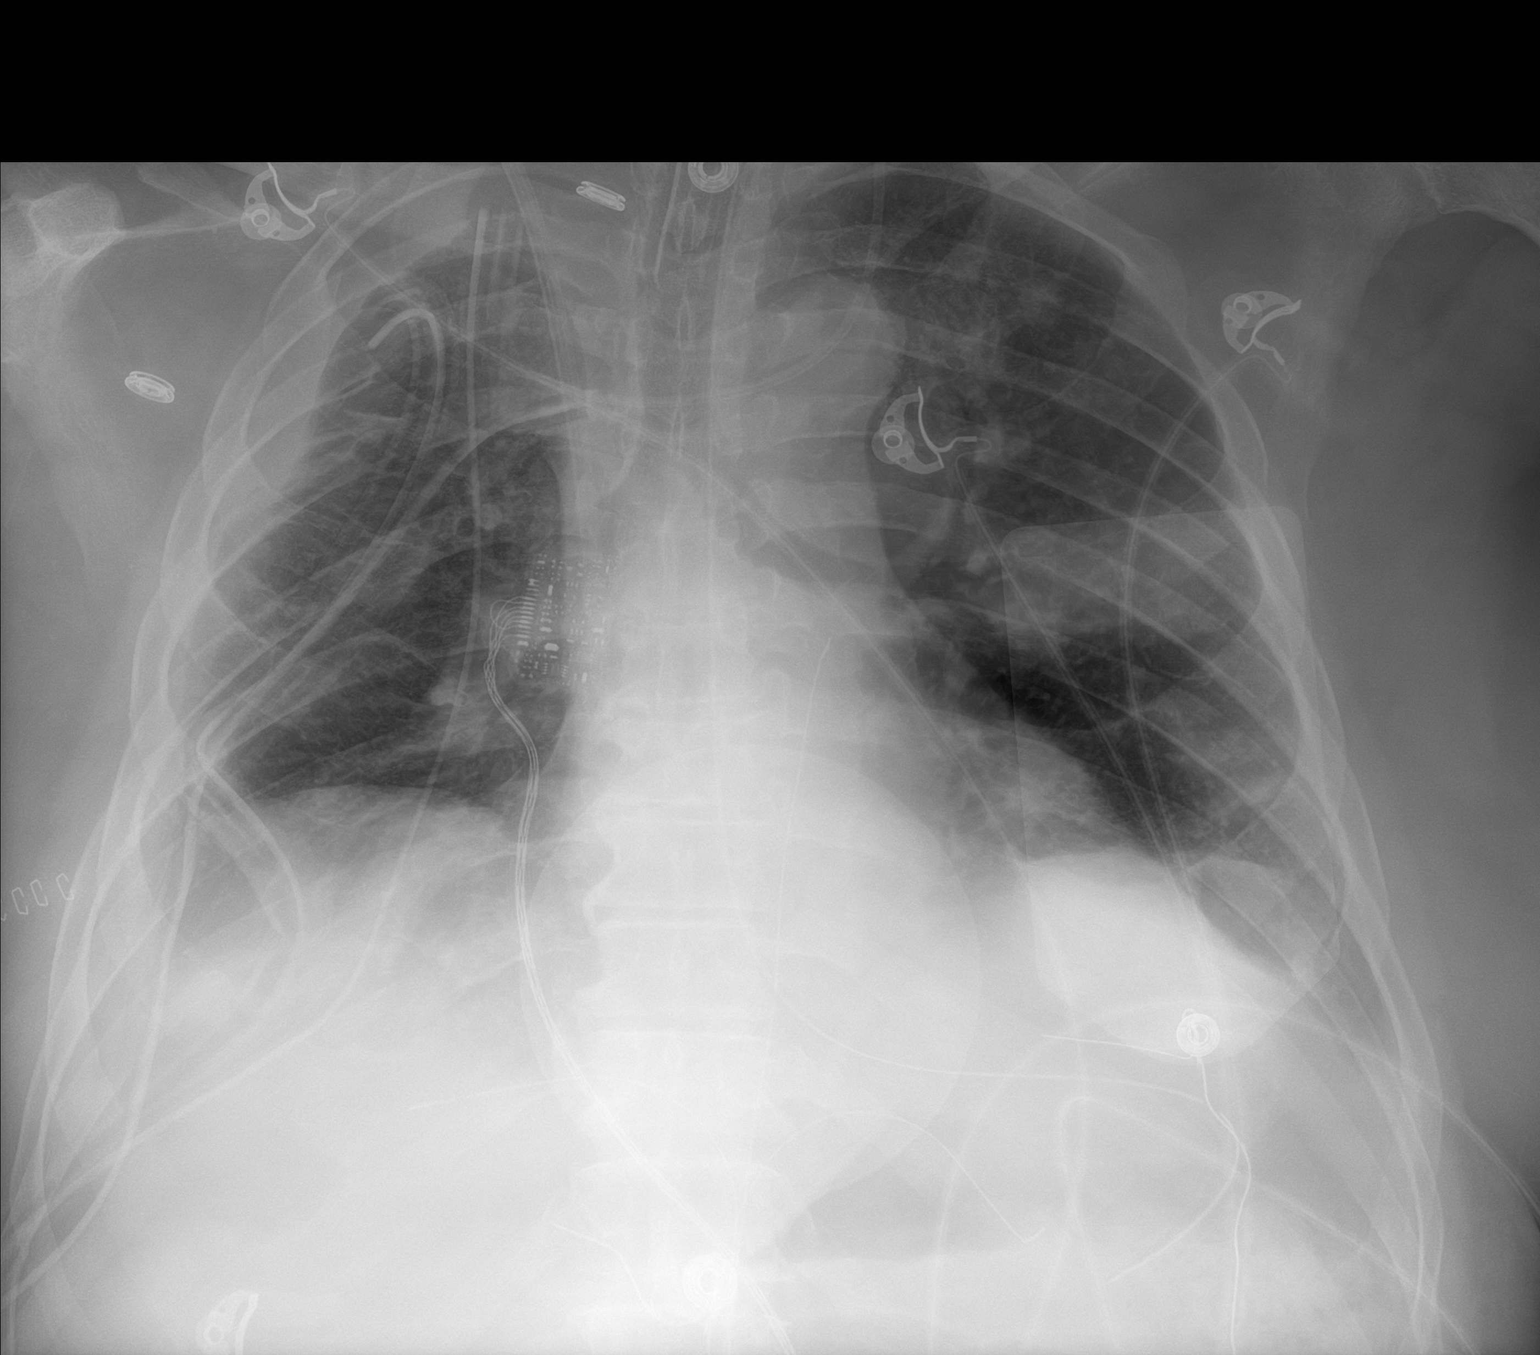

[1 of 1 positions shown; findings below may reference images not displayed]

FINDINGS: Endotracheal tube placed with tip measuring 5.5 cm above the carina.
Enteric tube tip is in the left upper quadrant consistent with
location in the upper stomach. Right central venous catheter placed
with tip over the low SVC region. No pneumothorax. Two right chest
tubes are present. No visible pneumothorax. Small amount of fluid or
thickened pleura along the right lateral chest wall. Degenerative
changes in the spine.
IMPRESSION: Appliances appear to be in satisfactory location. No pneumothorax.
